# Patient Record
Sex: Female | Born: 1950 | Race: White | Hispanic: No | Marital: Married | Smoking: Never smoker
Health system: Southern US, Community
[De-identification: ages and names within clinical notes are randomized; demographics above are authoritative.]

## PROBLEM LIST (undated history)

## (undated) DIAGNOSIS — M1990 Unspecified osteoarthritis, unspecified site: Secondary | ICD-10-CM

## (undated) DIAGNOSIS — N189 Chronic kidney disease, unspecified: Secondary | ICD-10-CM

## (undated) DIAGNOSIS — I10 Essential (primary) hypertension: Secondary | ICD-10-CM

## (undated) DIAGNOSIS — E7800 Pure hypercholesterolemia, unspecified: Secondary | ICD-10-CM

## (undated) HISTORY — PX: REPLACEMENT TOTAL HIP W/  RESURFACING IMPLANTS: SUR1222

## (undated) HISTORY — PX: BACK SURGERY: SHX140

---

## 2005-07-02 ENCOUNTER — Other Ambulatory Visit: Admission: RE | Admit: 2005-07-02 | Discharge: 2005-07-02 | Payer: Self-pay | Admitting: Obstetrics and Gynecology

## 2005-07-07 ENCOUNTER — Encounter: Admission: RE | Admit: 2005-07-07 | Discharge: 2005-07-07 | Payer: Self-pay | Admitting: Allergy and Immunology

## 2007-07-05 ENCOUNTER — Encounter: Admission: RE | Admit: 2007-07-05 | Discharge: 2007-07-05 | Payer: Self-pay | Admitting: Family Medicine

## 2007-07-08 ENCOUNTER — Encounter: Admission: RE | Admit: 2007-07-08 | Discharge: 2007-07-08 | Payer: Self-pay | Admitting: Family Medicine

## 2010-11-20 ENCOUNTER — Encounter
Admission: RE | Admit: 2010-11-20 | Discharge: 2010-11-20 | Payer: Self-pay | Source: Home / Self Care | Attending: Orthopaedic Surgery | Admitting: Orthopaedic Surgery

## 2011-07-02 ENCOUNTER — Other Ambulatory Visit: Payer: Self-pay | Admitting: Gastroenterology

## 2011-07-02 DIAGNOSIS — R1011 Right upper quadrant pain: Secondary | ICD-10-CM

## 2011-07-06 ENCOUNTER — Other Ambulatory Visit: Payer: Self-pay

## 2011-07-09 ENCOUNTER — Ambulatory Visit
Admission: RE | Admit: 2011-07-09 | Discharge: 2011-07-09 | Disposition: A | Discharge status: Home / Self Care | Payer: BC Managed Care – PPO | Source: Ambulatory Visit | Attending: Gastroenterology | Admitting: Gastroenterology

## 2011-07-09 DIAGNOSIS — R1011 Right upper quadrant pain: Secondary | ICD-10-CM

## 2012-03-02 DIAGNOSIS — Z96649 Presence of unspecified artificial hip joint: Secondary | ICD-10-CM | POA: Insufficient documentation

## 2013-03-02 DIAGNOSIS — M1610 Unilateral primary osteoarthritis, unspecified hip: Secondary | ICD-10-CM | POA: Insufficient documentation

## 2014-06-29 ENCOUNTER — Ambulatory Visit
Admission: RE | Admit: 2014-06-29 | Discharge: 2014-06-29 | Disposition: A | Discharge status: Home / Self Care | Payer: BC Managed Care – PPO | Source: Ambulatory Visit | Attending: Family Medicine | Admitting: Family Medicine

## 2014-06-29 ENCOUNTER — Other Ambulatory Visit: Payer: Self-pay | Admitting: Family Medicine

## 2014-06-29 DIAGNOSIS — E871 Hypo-osmolality and hyponatremia: Secondary | ICD-10-CM

## 2015-09-17 ENCOUNTER — Other Ambulatory Visit: Payer: Self-pay | Admitting: Orthopedic Surgery

## 2015-09-17 DIAGNOSIS — M25572 Pain in left ankle and joints of left foot: Secondary | ICD-10-CM

## 2015-09-23 ENCOUNTER — Ambulatory Visit
Admission: RE | Admit: 2015-09-23 | Discharge: 2015-09-23 | Disposition: A | Discharge status: Home / Self Care | Payer: 59 | Source: Ambulatory Visit | Attending: Orthopedic Surgery | Admitting: Orthopedic Surgery

## 2015-09-23 DIAGNOSIS — M25572 Pain in left ankle and joints of left foot: Secondary | ICD-10-CM

## 2016-05-27 DIAGNOSIS — H9209 Otalgia, unspecified ear: Secondary | ICD-10-CM | POA: Insufficient documentation

## 2017-09-10 ENCOUNTER — Emergency Department (HOSPITAL_COMMUNITY): Payer: 59

## 2017-09-10 ENCOUNTER — Emergency Department (HOSPITAL_COMMUNITY)
Admission: EM | Admit: 2017-09-10 | Discharge: 2017-09-10 | Disposition: A | Discharge status: Home / Self Care | Payer: 59 | Attending: Emergency Medicine | Admitting: Emergency Medicine

## 2017-09-10 ENCOUNTER — Encounter (HOSPITAL_COMMUNITY): Payer: Self-pay | Admitting: Emergency Medicine

## 2017-09-10 DIAGNOSIS — Y999 Unspecified external cause status: Secondary | ICD-10-CM | POA: Diagnosis not present

## 2017-09-10 DIAGNOSIS — I129 Hypertensive chronic kidney disease with stage 1 through stage 4 chronic kidney disease, or unspecified chronic kidney disease: Secondary | ICD-10-CM | POA: Diagnosis not present

## 2017-09-10 DIAGNOSIS — Y92481 Parking lot as the place of occurrence of the external cause: Secondary | ICD-10-CM | POA: Insufficient documentation

## 2017-09-10 DIAGNOSIS — Y939 Activity, unspecified: Secondary | ICD-10-CM | POA: Insufficient documentation

## 2017-09-10 DIAGNOSIS — W19XXXA Unspecified fall, initial encounter: Secondary | ICD-10-CM

## 2017-09-10 DIAGNOSIS — W010XXA Fall on same level from slipping, tripping and stumbling without subsequent striking against object, initial encounter: Secondary | ICD-10-CM | POA: Diagnosis not present

## 2017-09-10 DIAGNOSIS — S50311A Abrasion of right elbow, initial encounter: Secondary | ICD-10-CM | POA: Diagnosis not present

## 2017-09-10 DIAGNOSIS — S80211A Abrasion, right knee, initial encounter: Secondary | ICD-10-CM | POA: Diagnosis not present

## 2017-09-10 DIAGNOSIS — T148XXA Other injury of unspecified body region, initial encounter: Secondary | ICD-10-CM

## 2017-09-10 DIAGNOSIS — N189 Chronic kidney disease, unspecified: Secondary | ICD-10-CM | POA: Diagnosis not present

## 2017-09-10 DIAGNOSIS — M25561 Pain in right knee: Secondary | ICD-10-CM | POA: Insufficient documentation

## 2017-09-10 HISTORY — DX: Chronic kidney disease, unspecified: N18.9

## 2017-09-10 HISTORY — DX: Essential (primary) hypertension: I10

## 2017-09-10 HISTORY — DX: Pure hypercholesterolemia, unspecified: E78.00

## 2017-09-10 MED ORDER — BACITRACIN ZINC 500 UNIT/GM EX OINT
TOPICAL_OINTMENT | Freq: Once | CUTANEOUS | Status: AC
  Administered 2017-09-10: 23:00:00 via TOPICAL

## 2017-09-10 NOTE — Discharge Instructions (Signed)
X-ray showed no fracture. Please keep the abrasions clean and dry. Apply anabolic ointment. Watch out for signs of infection.  Please rest, ice, compress and elevated the affected body part to help with swelling and pain.  The knee immobilizer for comfort. I give you follow-up with orthopedic doctor if her symptoms are not improving. Return to the ED if he develops any worsening symptoms.

## 2017-09-10 NOTE — ED Notes (Signed)
See providers notes

## 2017-09-10 NOTE — ED Provider Notes (Signed)
[facility]-EMERGENCY DEPT Provider Note   CSN: [id number] Arrival date & time: 09/10/17  1813     History   Chief Complaint Chief Complaint  Patient presents with  . Fall    HPI Suzanne Harrison is a 66 y.o. female.  HPI 66 year old female past medical history significant for CK D and hypertension presents to the emergency Department today for evaluation following a mechanical fall. Patient complains of right knee pain. Patient states that she was in the parking lot when she lost her footing and fell landing onto her right knee. She caught herself on her car with her right elbow. She has an abrasion to her right knee and right elbow. Patient states that the pain is worse in the right knee. Denies any pain in her right elbow. States that she was "fine for a while" but then states her right knee started hurting when she was visiting another patient. Patient did not take any for pain prior to arrival. States that she takes oxycodone at home for chronic pain but did not take her medicine prior to coming to the ED. Patient denies any LOC or head injury. Has been ambulatory since the accident. Uses a cane at baseline. Denies any associated paresthesias, weakness. Past Medical History:  Diagnosis Date  . CKD (chronic kidney disease)   . Hypercholesteremia   . Hypertension     There are no active problems to display for this patient.   Past Surgical History:  Procedure Laterality Date  . BACK SURGERY    . REPLACEMENT TOTAL HIP W/  RESURFACING IMPLANTS      OB History    No data available       Home Medications    Prior to Admission medications   Not on File    Family History History reviewed. No pertinent family history.  Social History Social History  Substance Use Topics  . Smoking status: Never Smoker  . Smokeless tobacco: Never Used  . Alcohol use No     Allergies   Sulfa antibiotics   Review of Systems Review of Systems  Musculoskeletal: Positive for  arthralgias and joint swelling. Negative for back pain, gait problem and neck pain.  Skin: Positive for wound.  Neurological: Negative for syncope, weakness, numbness and headaches.     Physical Exam Updated Vital Signs BP 110/61 (BP Location: Right Arm)   Pulse (!) 58   Temp 98 F (36.7 C) (Oral)   Resp 20   SpO2 100%   Physical Exam  Constitutional: She is oriented to person, place, and time. She appears well-developed and well-nourished. No distress.  HENT:  Head: Normocephalic and atraumatic.  Eyes: Right eye exhibits no discharge. Left eye exhibits no discharge. No scleral icterus.  Neck: Normal range of motion.  Pulmonary/Chest: No respiratory distress.  Musculoskeletal: Normal range of motion.       Right elbow: She exhibits normal range of motion, no swelling, no effusion, no deformity and no laceration. No tenderness found. No radial head, no medial epicondyle, no lateral epicondyle and no olecranon process tenderness noted.       Right knee: She exhibits normal range of motion, no swelling, no effusion, no ecchymosis, no deformity, no laceration, no erythema, normal alignment, no LCL laxity, no bony tenderness, normal meniscus and no MCL laxity. Tenderness found. Patellar tendon tenderness noted. No medial joint line, no lateral joint line, no MCL and no LCL tenderness noted.  Abrasion to her right knee and right elbow. No bleeding noted.  No laceration noted. Ambulatory with a cane.  DP pulses 2+ bilaterally. Sensation intact. Cap refill normal. Radial pulses 2+ bilaterally.  Full range motion of all joints by erythema, warmth.  Neurological: She is alert and oriented to person, place, and time.  Skin: Skin is warm and dry. Capillary refill takes less than 2 seconds. No pallor.  Psychiatric: Her behavior is normal. Judgment and thought content normal.  Nursing note and vitals reviewed.    ED Treatments / Results  Labs (all labs ordered are listed, but only abnormal  results are displayed) Labs Reviewed - No data to display  EKG  EKG Interpretation None       Radiology Dg Knee Complete 4 Views Right  Result Date: 09/10/2017 CLINICAL DATA:  Right knee pain since a fall today. Initial encounter. EXAM: RIGHT KNEE - COMPLETE 4+ VIEW COMPARISON:  None. FINDINGS: There is no acute bony or joint abnormality. Joint spaces are preserved but there is osteophytosis about the knee which is worst laterally. No joint effusion. IMPRESSION: No acute abnormality. Osteoarthritis. Electronically Signed   By: Drusilla Kanner M.D.   On: 09/10/2017 19:40    Procedures Procedures (including critical care time)  Medications Ordered in ED Medications  bacitracin ointment (not administered)     Initial Impression / Assessment and Plan / ED Course  I have reviewed the triage vital signs and the nursing notes.  Pertinent labs & imaging results that were available during my care of the patient were reviewed by me and considered in my medical decision making (see chart for details).     Patient presents to the ED with right knee pain following a mechanical fall prior to arrival. Denies head injury or LOC. Patient X-Ray negative for obvious fracture or dislocation. Pain managed in ED. Pt advised to follow up with orthopedics if symptoms persist for possibility of missed fracture diagnosis. Patient given brace while in ED, conservative therapy recommended and discussed. Patient was requesting her home dose of Percocet to go home with for pain. Informed patient that she was driving and cannot give her her Percocet in the ED. Informed her to take her Percocet when she goes home. Abrasions were dressed and about was applied. Full range of motion of the right elbow did not feel imaging is indicated at this time as patient denies any pain.   Pt is hemodynamically stable, in NAD, & able to ambulate in the ED. Evaluation does not show pathology that would require ongoing emergent  intervention or inpatient treatment. I explained the diagnosis to the patient. Pain has been managed & has no complaints prior to dc. Pt is comfortable with above plan and is stable for discharge at this time. All questions were answered prior to disposition. Strict return precautions for f/u to the ED were discussed. Encouraged follow up with PCP.    Final Clinical Impressions(s) / ED Diagnoses   Final diagnoses:  Fall, initial encounter  Acute pain of right knee  Abrasion    New Prescriptions New Prescriptions   No medications on file     Wallace Keller 09/10/17 2224    Vanetta Mulders, MD 09/11/17 [phone]

## 2017-09-10 NOTE — Progress Notes (Signed)
Orthopedic Tech Progress Note Patient Details:  Suzanne Harrison [birth year] [id number]  Ortho Devices Type of Ortho Device: Knee Immobilizer Ortho Device/Splint Location: Right Ortho Device/Splint Interventions: Application, Adjustment   Alvina Chou 09/10/2017, 10:54 PM

## 2017-09-10 NOTE — ED Triage Notes (Signed)
Pt presents to ED after having a slip and fall in the parking lot where patient went down on to her right knee and caught herself on her car with her elbow.  Pt states she was "fine for a while" but then states her right knee started to hurt while she was visiting with another patient.

## 2018-03-03 ENCOUNTER — Encounter: Payer: Self-pay | Admitting: Podiatry

## 2018-03-03 ENCOUNTER — Ambulatory Visit (INDEPENDENT_AMBULATORY_CARE_PROVIDER_SITE_OTHER): Payer: Medicare Other | Admitting: Podiatry

## 2018-03-03 DIAGNOSIS — L600 Ingrowing nail: Secondary | ICD-10-CM

## 2018-03-03 MED ORDER — NEOMYCIN-POLYMYXIN-HC 1 % OT SOLN: OTIC | 1 refills | Status: DC

## 2018-03-03 NOTE — Patient Instructions (Signed)

## 2018-03-03 NOTE — Progress Notes (Signed)
Subjective:  Patient ID: Suzanne Harrison, female    DOB: 1951/10/31,  MRN: [id number] HPI Chief Complaint  Patient presents with  . Toe Pain    Hallux bilateral - medial borders, red, swollen, some drainage from left x couple weeks, cleaning with peroxide, patient states she took 4 amoxicillin prior to todays visit for prophylaxis  . New Patient (Initial Visit)    67 y.o. female presents with the above complaint.    ROS: Denies fever chills nausea vomiting muscle aches pain chest pain shortness of breath calf pain headache.  Past Medical History:  Diagnosis Date  . CKD (chronic kidney disease)   . Hypercholesteremia   . Hypertension    Past Surgical History:  Procedure Laterality Date  . BACK SURGERY    . REPLACEMENT TOTAL HIP W/  RESURFACING IMPLANTS      Current Outpatient Medications:  .  amoxicillin (AMOXIL) 500 MG tablet, Take four capsules one hour prior to procedure., Disp: , Rfl:  .  Carboxymethylcellulose Sodium (REFRESH TEARS OP), Apply to eye., Disp: , Rfl:  .  cycloSPORINE (RESTASIS OP), Apply to eye., Disp: , Rfl:  .  traZODone (DESYREL) 50 MG tablet, trazodone 50 mg tablet, Disp: , Rfl:  .  citalopram (CELEXA) 20 MG tablet, Take 20 mg by mouth daily., Disp: , Rfl: 2 .  levothyroxine (SYNTHROID, LEVOTHROID) 50 MCG tablet, Take 50 mcg by mouth daily., Disp: , Rfl: 2 .  lisinopril (PRINIVIL,ZESTRIL) 5 MG tablet, Take 5 mg by mouth daily., Disp: , Rfl: 5 .  NEOMYCIN-POLYMYXIN-HYDROCORTISONE (CORTISPORIN) 1 % SOLN OTIC solution, Apply 1-2 drops to toe BID after soaking, Disp: 10 mL, Rfl: 1 .  niacin (NIASPAN) 500 MG CR tablet, TAKE 1 TABLET BY MOUTH EVERYDAY AT BEDTIME, Disp: , Rfl: 2 .  ondansetron (ZOFRAN-ODT) 8 MG disintegrating tablet, TAKE AS DIRECTED EVERY 8 HOURS AS NEEDED FOR NAUSEA, Disp: , Rfl: 0 .  oxybutynin (DITROPAN-XL) 10 MG 24 hr tablet, Take 10 mg by mouth daily., Disp: , Rfl: 11 .  oxyCODONE-acetaminophen (PERCOCET) 10-325 MG tablet, TAKE 1  TABLET BY MOUTH EVERY 6 HOURS AS NEEDED FOR PAIN AS NEEDED, Disp: , Rfl: 0 .  pantoprazole (PROTONIX) 40 MG tablet, Take 40 mg by mouth 2 (two) times daily., Disp: , Rfl: 2 .  pregabalin (LYRICA) 150 MG capsule, Lyrica 150 mg capsule, Disp: , Rfl:  .  rosuvastatin (CRESTOR) 10 MG tablet, Take 10 mg by mouth daily., Disp: , Rfl: 5  Allergies  Allergen Reactions  . Sulfa Antibiotics Nausea Only  . Gatifloxacin Rash    Other reaction(s): Myalgias (Muscle Pain) Rash in mouth    Review of Systems Objective:  There were no vitals filed for this visit.  General: Well developed, nourished, in no acute distress, alert and oriented x3   Dermatological: Skin is warm, dry and supple bilateral. Nails x 10 are well maintained; remaining integument appears unremarkable at this time. There are no open sores, no preulcerative lesions, no rash or signs of infection present.  Ingrown toenail tibial border hallux left mild erythema no cellulitis drainage or odor.  Vascular: Dorsalis Pedis artery and Posterior Tibial artery pedal pulses are 2/4 bilateral with immedate capillary fill time. Pedal hair growth present. No varicosities and no lower extremity edema present bilateral.   Neruologic: Grossly intact via light touch bilateral. Vibratory intact via tuning fork bilateral. Protective threshold with Semmes Wienstein monofilament intact to all pedal sites bilateral. Patellar and Achilles deep tendon reflexes 2+ bilateral. No Babinski  or clonus noted bilateral.   Musculoskeletal: No gross boney pedal deformities bilateral. No pain, crepitus, or limitation noted with foot and ankle range of motion bilateral. Muscular strength 5/5 in all groups tested bilateral.  Gait: Unassisted, Nonantalgic.    Radiographs:  None taken  Assessment & Plan:   Assessment: Ingrown nail tibial border hallux left.  Plan: We discussed etiology pathology conservative versus surgical therapies.  At this point we decided to  perform a chemical matrixectomy which was performed after 3 cc of a 50-50 mixture of Marcaine plain and lidocaine plain was infiltrated in a hallux block left.  The left lower extremity then prepped and draped as normal sterile fashion.  The tibial border was split from distal to proximal avulsed exposing the matrix.  3 applications of phenol 30 seconds each were applied to the matrix and then neutralized with isopropyl alcohol.  Silvadene cream Telfa pad dresser compressive dressing was applied.  She is provided with both oral and written home-going instruction for care and soaking of her toe as well as a prescription for Cortisporin Otic to be applied twice daily after soaking.  We will follow-up with her in 1 week should she have questions or concerns she will notify us immediately.     Jillane Po T. LoomisHyatt, North DakotaDPM

## 2018-03-17 ENCOUNTER — Encounter: Payer: Self-pay | Admitting: Podiatry

## 2018-03-17 ENCOUNTER — Ambulatory Visit (INDEPENDENT_AMBULATORY_CARE_PROVIDER_SITE_OTHER): Payer: Medicare Other | Admitting: Podiatry

## 2018-03-17 DIAGNOSIS — L600 Ingrowing nail: Secondary | ICD-10-CM

## 2018-03-17 NOTE — Patient Instructions (Signed)

## 2018-03-17 NOTE — Progress Notes (Signed)
She presents today for follow-up of matrixectomy hallux left.  Denies fever chills nausea vomiting muscle aches and pains states that is doing just perfectly well.  No pain.  Objective: Vital signs are stable alert and oriented x3.  Pulses are palpable.  There is no erythema edema cellulitis drainage or odor.  Assessment: Well-healing surgical matrixectomy hallux left.  Plan: Encouraged her to soak in Epsom salts every other day apply Cortisporin otic cover during the daytime and leave open at bedtime for at least another week or so and I will follow-up with her in 2 weeks if this is not completely resolved.

## 2019-10-01 ENCOUNTER — Encounter (HOSPITAL_COMMUNITY): Payer: Self-pay | Admitting: Emergency Medicine

## 2019-10-01 ENCOUNTER — Emergency Department (HOSPITAL_COMMUNITY): Payer: 59

## 2019-10-01 ENCOUNTER — Inpatient Hospital Stay (HOSPITAL_COMMUNITY)
Admission: EM | Admit: 2019-10-01 | Discharge: 2019-10-06 | DRG: 410 | Disposition: A | Discharge status: Home / Self Care | Payer: 59 | Attending: General Surgery | Admitting: General Surgery

## 2019-10-01 ENCOUNTER — Other Ambulatory Visit: Payer: Self-pay

## 2019-10-01 DIAGNOSIS — G8929 Other chronic pain: Secondary | ICD-10-CM | POA: Diagnosis present

## 2019-10-01 DIAGNOSIS — N189 Chronic kidney disease, unspecified: Secondary | ICD-10-CM | POA: Diagnosis present

## 2019-10-01 DIAGNOSIS — Z881 Allergy status to other antibiotic agents status: Secondary | ICD-10-CM

## 2019-10-01 DIAGNOSIS — R7989 Other specified abnormal findings of blood chemistry: Secondary | ICD-10-CM

## 2019-10-01 DIAGNOSIS — K8020 Calculus of gallbladder without cholecystitis without obstruction: Secondary | ICD-10-CM

## 2019-10-01 DIAGNOSIS — E876 Hypokalemia: Secondary | ICD-10-CM | POA: Diagnosis present

## 2019-10-01 DIAGNOSIS — K810 Acute cholecystitis: Secondary | ICD-10-CM

## 2019-10-01 DIAGNOSIS — E785 Hyperlipidemia, unspecified: Secondary | ICD-10-CM | POA: Diagnosis present

## 2019-10-01 DIAGNOSIS — I129 Hypertensive chronic kidney disease with stage 1 through stage 4 chronic kidney disease, or unspecified chronic kidney disease: Secondary | ICD-10-CM | POA: Diagnosis present

## 2019-10-01 DIAGNOSIS — Z79890 Hormone replacement therapy (postmenopausal): Secondary | ICD-10-CM | POA: Diagnosis not present

## 2019-10-01 DIAGNOSIS — Z20828 Contact with and (suspected) exposure to other viral communicable diseases: Secondary | ICD-10-CM | POA: Diagnosis present

## 2019-10-01 DIAGNOSIS — K8062 Calculus of gallbladder and bile duct with acute cholecystitis without obstruction: Principal | ICD-10-CM | POA: Diagnosis present

## 2019-10-01 DIAGNOSIS — Z882 Allergy status to sulfonamides status: Secondary | ICD-10-CM

## 2019-10-01 DIAGNOSIS — Z79899 Other long term (current) drug therapy: Secondary | ICD-10-CM

## 2019-10-01 DIAGNOSIS — R1010 Upper abdominal pain, unspecified: Secondary | ICD-10-CM

## 2019-10-01 DIAGNOSIS — R1013 Epigastric pain: Secondary | ICD-10-CM | POA: Diagnosis present

## 2019-10-01 DIAGNOSIS — R945 Abnormal results of liver function studies: Secondary | ICD-10-CM

## 2019-10-01 HISTORY — DX: Calculus of gallbladder without cholecystitis without obstruction: K80.20

## 2019-10-01 LAB — CBC WITH DIFFERENTIAL/PLATELET
Abs Immature Granulocytes: 0.05 10*3/uL (ref 0.00–0.07)
Basophils Absolute: 0 10*3/uL (ref 0.0–0.1)
Basophils Relative: 0 %
Eosinophils Absolute: 0 10*3/uL (ref 0.0–0.5)
Eosinophils Relative: 0 %
HCT: 36.8 % (ref 36.0–46.0)
Hemoglobin: 11.6 g/dL — ABNORMAL LOW (ref 12.0–15.0)
Immature Granulocytes: 1 %
Lymphocytes Relative: 5 %
Lymphs Abs: 0.3 10*3/uL — ABNORMAL LOW (ref 0.7–4.0)
MCH: 26.5 pg (ref 26.0–34.0)
MCHC: 31.5 g/dL (ref 30.0–36.0)
MCV: 84 fL (ref 80.0–100.0)
Monocytes Absolute: 0.4 10*3/uL (ref 0.1–1.0)
Monocytes Relative: 6 %
Neutro Abs: 6.7 10*3/uL (ref 1.7–7.7)
Neutrophils Relative %: 88 %
Platelets: 128 10*3/uL — ABNORMAL LOW (ref 150–400)
RBC: 4.38 MIL/uL (ref 3.87–5.11)
RDW: 14.5 % (ref 11.5–15.5)
WBC: 7.5 10*3/uL (ref 4.0–10.5)
nRBC: 0 % (ref 0.0–0.2)

## 2019-10-01 LAB — COMPREHENSIVE METABOLIC PANEL
ALT: 114 U/L — ABNORMAL HIGH (ref 0–44)
AST: 297 U/L — ABNORMAL HIGH (ref 15–41)
Albumin: 3.2 g/dL — ABNORMAL LOW (ref 3.5–5.0)
Alkaline Phosphatase: 309 U/L — ABNORMAL HIGH (ref 38–126)
Anion gap: 10 (ref 5–15)
BUN: 11 mg/dL (ref 8–23)
CO2: 26 mmol/L (ref 22–32)
Calcium: 9.1 mg/dL (ref 8.9–10.3)
Chloride: 101 mmol/L (ref 98–111)
Creatinine, Ser: 1 mg/dL (ref 0.44–1.00)
GFR calc Af Amer: >60 mL/min (ref >60)
GFR calc non Af Amer: 59 mL/min — ABNORMAL LOW (ref >60)
Glucose, Bld: 133 mg/dL — ABNORMAL HIGH (ref 70–99)
Potassium: 3.5 mmol/L (ref 3.5–5.1)
Sodium: 137 mmol/L (ref 135–145)
Total Bilirubin: 2.9 mg/dL — ABNORMAL HIGH (ref 0.3–1.2)
Total Protein: 6.5 g/dL (ref 6.5–8.1)

## 2019-10-01 LAB — URINALYSIS, ROUTINE W REFLEX MICROSCOPIC
Bilirubin Urine: NEGATIVE
Glucose, UA: NEGATIVE mg/dL
Hgb urine dipstick: NEGATIVE
Ketones, ur: NEGATIVE mg/dL
Leukocytes,Ua: NEGATIVE
Nitrite: NEGATIVE
Protein, ur: NEGATIVE mg/dL
Specific Gravity, Urine: 1.038 — ABNORMAL HIGH (ref 1.005–1.030)
pH: 5 (ref 5.0–8.0)

## 2019-10-01 LAB — LACTIC ACID, PLASMA
Lactic Acid, Venous: 1.1 mmol/L (ref 0.5–1.9)
Lactic Acid, Venous: 0.7 mmol/L (ref 0.5–1.9)

## 2019-10-01 LAB — LIPASE, BLOOD: Lipase: 25 U/L (ref 11–51)

## 2019-10-01 MED ORDER — ACETAMINOPHEN 650 MG RE SUPP: 650.0000 mg | Freq: Four times a day (QID) | RECTAL | Status: DC | PRN

## 2019-10-01 MED ORDER — ONDANSETRON HCL 4 MG/2ML IJ SOLN
4.0000 mg | Freq: Four times a day (QID) | INTRAMUSCULAR | Status: DC | PRN
  Administered 2019-10-04 – 2019-10-05 (×3): 4 mg via INTRAVENOUS
  Filled 2019-10-01 (×3): qty 2

## 2019-10-01 MED ORDER — METOPROLOL TARTRATE 5 MG/5ML IV SOLN: 5.0000 mg | Freq: Four times a day (QID) | INTRAVENOUS | Status: DC | PRN

## 2019-10-01 MED ORDER — DEXTROSE-NACL 5-0.45 % IV SOLN
INTRAVENOUS | Status: DC
  Administered 2019-10-01 – 2019-10-04 (×5): via INTRAVENOUS

## 2019-10-01 MED ORDER — CITALOPRAM HYDROBROMIDE 20 MG PO TABS
20.0000 mg | ORAL_TABLET | Freq: Every day | ORAL | Status: DC
  Administered 2019-10-01 – 2019-10-06 (×5): 20 mg via ORAL
  Filled 2019-10-01: qty 2
  Filled 2019-10-01 (×5): qty 1

## 2019-10-01 MED ORDER — SODIUM CHLORIDE 0.9% FLUSH: 3.0000 mL | Freq: Once | INTRAVENOUS | Status: DC

## 2019-10-01 MED ORDER — SODIUM CHLORIDE 0.9% FLUSH
3.0000 mL | Freq: Once | INTRAVENOUS | Status: AC
  Administered 2019-10-01: 3 mL via INTRAVENOUS

## 2019-10-01 MED ORDER — PREGABALIN 100 MG PO CAPS
100.0000 mg | ORAL_CAPSULE | Freq: Every day | ORAL | Status: DC
  Administered 2019-10-01 – 2019-10-04 (×4): 100 mg via ORAL
  Filled 2019-10-01 (×5): qty 1

## 2019-10-01 MED ORDER — TRAZODONE HCL 50 MG PO TABS
50.0000 mg | ORAL_TABLET | Freq: Every day | ORAL | Status: DC
  Administered 2019-10-01 – 2019-10-05 (×5): 50 mg via ORAL
  Filled 2019-10-01 (×5): qty 1

## 2019-10-01 MED ORDER — DIPHENHYDRAMINE HCL 50 MG/ML IJ SOLN: 12.5000 mg | Freq: Four times a day (QID) | INTRAMUSCULAR | Status: DC | PRN

## 2019-10-01 MED ORDER — OXYCODONE HCL 5 MG PO TABS
5.0000 mg | ORAL_TABLET | ORAL | Status: DC | PRN
  Administered 2019-10-02 – 2019-10-03 (×4): 5 mg via ORAL
  Filled 2019-10-01 (×4): qty 1

## 2019-10-01 MED ORDER — DIPHENHYDRAMINE HCL 12.5 MG/5ML PO ELIX: 12.5000 mg | ORAL_SOLUTION | Freq: Four times a day (QID) | ORAL | Status: DC | PRN

## 2019-10-01 MED ORDER — ENOXAPARIN SODIUM 40 MG/0.4ML ~~LOC~~ SOLN: 40.0000 mg | SUBCUTANEOUS | Status: DC

## 2019-10-01 MED ORDER — OXYBUTYNIN CHLORIDE ER 10 MG PO TB24
10.0000 mg | ORAL_TABLET | Freq: Every day | ORAL | Status: DC
  Administered 2019-10-02 – 2019-10-05 (×5): 10 mg via ORAL
  Filled 2019-10-01 (×5): qty 1

## 2019-10-01 MED ORDER — PANTOPRAZOLE SODIUM 40 MG IV SOLR
40.0000 mg | Freq: Once | INTRAVENOUS | Status: AC
  Administered 2019-10-01: 40 mg via INTRAVENOUS
  Filled 2019-10-01: qty 40

## 2019-10-01 MED ORDER — ONDANSETRON HCL 4 MG/2ML IJ SOLN
4.0000 mg | Freq: Once | INTRAMUSCULAR | Status: AC
  Administered 2019-10-01: 4 mg via INTRAVENOUS
  Filled 2019-10-01: qty 2

## 2019-10-01 MED ORDER — ONDANSETRON 4 MG PO TBDP
4.0000 mg | ORAL_TABLET | Freq: Four times a day (QID) | ORAL | Status: DC | PRN
  Administered 2019-10-06: 4 mg via ORAL
  Filled 2019-10-01: qty 1

## 2019-10-01 MED ORDER — ACETAMINOPHEN 325 MG PO TABS
650.0000 mg | ORAL_TABLET | Freq: Four times a day (QID) | ORAL | Status: DC | PRN
  Administered 2019-10-03: 650 mg via ORAL
  Filled 2019-10-01: qty 2

## 2019-10-01 MED ORDER — SODIUM CHLORIDE 0.9 % IV BOLUS
1000.0000 mL | Freq: Once | INTRAVENOUS | Status: AC
  Administered 2019-10-01: 1000 mL via INTRAVENOUS

## 2019-10-01 MED ORDER — LEVOTHYROXINE SODIUM 50 MCG PO TABS
50.0000 ug | ORAL_TABLET | Freq: Every day | ORAL | Status: DC
  Administered 2019-10-02 – 2019-10-06 (×4): 50 ug via ORAL
  Filled 2019-10-01 (×4): qty 1

## 2019-10-01 MED ORDER — IOHEXOL 300 MG/ML  SOLN
100.0000 mL | Freq: Once | INTRAMUSCULAR | Status: AC | PRN
  Administered 2019-10-01: 100 mL via INTRAVENOUS

## 2019-10-01 MED ORDER — MORPHINE SULFATE (PF) 2 MG/ML IV SOLN
2.0000 mg | INTRAVENOUS | Status: DC | PRN
  Administered 2019-10-03 – 2019-10-04 (×3): 2 mg via INTRAVENOUS
  Filled 2019-10-01 (×4): qty 1

## 2019-10-01 MED ORDER — SODIUM CHLORIDE 0.9 % IV SOLN
2.0000 g | INTRAVENOUS | Status: DC
  Administered 2019-10-01 – 2019-10-05 (×5): 2 g via INTRAVENOUS
  Filled 2019-10-01 (×2): qty 2
  Filled 2019-10-01: qty 20
  Filled 2019-10-01 (×2): qty 2
  Filled 2019-10-01: qty 20

## 2019-10-01 MED ORDER — HYDROMORPHONE HCL 1 MG/ML IJ SOLN
0.5000 mg | Freq: Once | INTRAMUSCULAR | Status: AC
  Administered 2019-10-01: 0.5 mg via INTRAVENOUS
  Filled 2019-10-01: qty 1

## 2019-10-01 NOTE — ED Triage Notes (Signed)
C/o generalized abd pain, nausea, vomiting, diarrhea, fever, SOB, generalized weakness, and fatigue since yesterday.  No known sick contacts.

## 2019-10-01 NOTE — ED Provider Notes (Signed)
[facility]   CSN: [id number] Arrival date & time: 10/01/19  1810     History   Chief Complaint Chief Complaint  Patient presents with   Abdominal Pain   Emesis   Diarrhea   Shortness of Breath   Fever    HPI Suzanne Harrison is a 68 y.o. female.     Patient complains of abdominal pain and nausea since yesterday.  Patient has a history of hyperlipidemia and elevated cholesterol.  The history is provided by the patient. No language interpreter was used.  Abdominal Pain Pain location:  Epigastric Pain quality: aching   Pain radiates to:  Does not radiate Pain severity:  Moderate Onset quality:  Sudden Timing:  Constant Progression:  Worsening Chronicity:  New Context: not alcohol use   Associated symptoms: diarrhea, fever, shortness of breath and vomiting   Associated symptoms: no chest pain, no cough, no fatigue and no hematuria   Emesis Associated symptoms: abdominal pain, diarrhea and fever   Associated symptoms: no cough and no headaches   Diarrhea Associated symptoms: abdominal pain, fever and vomiting   Associated symptoms: no headaches   Shortness of Breath Associated symptoms: abdominal pain, fever and vomiting   Associated symptoms: no chest pain, no cough, no headaches and no rash   Fever Associated symptoms: diarrhea and vomiting   Associated symptoms: no chest pain, no congestion, no cough, no headaches and no rash     Past Medical History:  Diagnosis Date   CKD (chronic kidney disease)    Hypercholesteremia    Hypertension     Patient Active Problem List   Diagnosis Date Noted   Referred ear pain 05/27/2016   Hip arthritis 03/02/2013   History of total hip arthroplasty 03/02/2012    Past Surgical History:  Procedure Laterality Date   BACK SURGERY     REPLACEMENT TOTAL HIP W/  RESURFACING IMPLANTS       OB History   No obstetric history on file.      Home  Medications    Prior to Admission medications   Medication Sig Start Date End Date Taking? Authorizing Provider  amoxicillin (AMOXIL) 500 MG tablet Take four capsules one hour prior to procedure. 02/25/18   [provider]  Carboxymethylcellulose Sodium (REFRESH TEARS OP) Apply to eye.    [provider]  citalopram (CELEXA) 20 MG tablet Take 20 mg by mouth daily. 02/23/18   [provider]  cycloSPORINE (RESTASIS OP) Apply to eye.    [provider]  levothyroxine (SYNTHROID, LEVOTHROID) 50 MCG tablet Take 50 mcg by mouth daily. 02/15/18   [provider]  lisinopril (PRINIVIL,ZESTRIL) 5 MG tablet Take 5 mg by mouth daily. 02/09/18   [provider]  NEOMYCIN-POLYMYXIN-HYDROCORTISONE (CORTISPORIN) 1 % SOLN OTIC solution Apply 1-2 drops to toe BID after soaking 03/03/18   Hyatt, Max T, DPM  niacin (NIASPAN) 500 MG CR tablet TAKE 1 TABLET BY MOUTH EVERYDAY AT BEDTIME 02/24/18   [provider]  ondansetron (ZOFRAN-ODT) 8 MG disintegrating tablet TAKE AS DIRECTED EVERY 8 HOURS AS NEEDED FOR NAUSEA 01/14/18   [provider]  oxybutynin (DITROPAN-XL) 10 MG 24 hr tablet Take 10 mg by mouth daily. 02/13/18   [provider]  oxyCODONE-acetaminophen (PERCOCET) 10-325 MG tablet TAKE 1 TABLET BY MOUTH EVERY 6 HOURS AS NEEDED FOR PAIN AS NEEDED 02/16/18   [provider]  pantoprazole (PROTONIX) 40 MG tablet Take 40 mg by mouth 2 (  two) times daily. 02/11/18   [provider]  pregabalin (LYRICA) 150 MG capsule Lyrica 150 mg capsule    [provider]  rosuvastatin (CRESTOR) 10 MG tablet Take 10 mg by mouth daily. 02/15/18   [provider]  traZODone (DESYREL) 50 MG tablet trazodone 50 mg tablet 12/16/12   [provider]    Family History No family history on file.  Social History Social History   Tobacco Use   Smoking status: Never Smoker   Smokeless tobacco: Never Used  Substance  Use Topics   Alcohol use: No   Drug use: No     Allergies   Sulfa antibiotics and Gatifloxacin   Review of Systems Review of Systems  Constitutional: Positive for fever. Negative for appetite change and fatigue.  HENT: Negative for congestion, ear discharge and sinus pressure.   Eyes: Negative for discharge.  Respiratory: Positive for shortness of breath. Negative for cough.   Cardiovascular: Negative for chest pain.  Gastrointestinal: Positive for abdominal pain, diarrhea and vomiting.  Genitourinary: Negative for frequency and hematuria.  Musculoskeletal: Negative for back pain.  Skin: Negative for rash.  Neurological: Negative for seizures and headaches.  Psychiatric/Behavioral: Negative for hallucinations.     Physical Exam Updated Vital Signs BP (!) 132/53    Pulse 89    Temp 99.8 F (37.7 C) (Oral)    Resp 15    SpO2 96%   Physical Exam Vitals signs and nursing note reviewed.  Constitutional:      Appearance: Normal appearance. She is well-developed.  HENT:     Head: Normocephalic.     Nose: Nose normal.  Eyes:     General: No scleral icterus.    Conjunctiva/sclera: Conjunctivae normal.  Neck:     Musculoskeletal: Neck supple.     Thyroid: No thyromegaly.  Cardiovascular:     Rate and Rhythm: Normal rate and regular rhythm.     Heart sounds: No murmur. No friction rub. No gallop.   Pulmonary:     Breath sounds: No stridor. No wheezing or rales.  Chest:     Chest wall: No tenderness.  Abdominal:     General: There is no distension.     Tenderness: There is abdominal tenderness. There is no rebound.  Musculoskeletal: Normal range of motion.  Lymphadenopathy:     Cervical: No cervical adenopathy.  Skin:    Findings: No erythema or rash.  Neurological:     Mental Status: She is alert and oriented to person, place, and time.     Motor: No abnormal muscle tone.     Coordination: Coordination normal.  Psychiatric:        Behavior: Behavior normal.       ED Treatments / Results  Labs (all labs ordered are listed, but only abnormal results are displayed) Labs Reviewed  COMPREHENSIVE METABOLIC PANEL - Abnormal; Notable for the following components:      Result Value   Glucose, Bld 133 (*)    Albumin 3.2 (*)    AST 297 (*)    ALT 114 (*)    Alkaline Phosphatase 309 (*)    Total Bilirubin 2.9 (*)    GFR calc non Af Amer 59 (*)    All other components within normal limits  CBC WITH DIFFERENTIAL/PLATELET - Abnormal; Notable for the following components:   Hemoglobin 11.6 (*)    Platelets 128 (*)    Lymphs Abs 0.3 (*)    All other components within normal limits  SARS CORONAVIRUS 2 (TAT 6-24 HRS)  LACTIC ACID, PLASMA  LIPASE, BLOOD  LACTIC ACID, PLASMA  URINALYSIS, ROUTINE W REFLEX MICROSCOPIC  HEPATITIS PANEL, ACUTE    EKG None  Radiology Ct Abdomen Pelvis W Contrast  Result Date: 10/01/2019 CLINICAL DATA:  Generalized abdominal pain, nausea, vomiting, diarrhea, fever, SOB, and generalized weakness, and fatigue since yesterday. Hx CKD, back surgery, and total hip replacement. EXAM: CT ABDOMEN AND PELVIS WITH CONTRAST TECHNIQUE: Multidetector CT imaging of the abdomen and pelvis was performed using the standard protocol following bolus administration of intravenous contrast. CONTRAST:  OMNIPAQUE IOHEXOL 300 MG/ML  SOLN COMPARISON:  None. FINDINGS: Lower chest: Clear lung bases.  Heart normal in size. Hepatobiliary: Normal liver. Abnormal gallbladder. Wall is thickened between 5-6 mm and over a cm. There is mild adjacent hazy inflammation. No visualized stone. No bile duct dilation. Pancreas: Unremarkable. No pancreatic ductal dilatation or surrounding inflammatory changes. Spleen: Normal in size without focal abnormality. Adrenals/Urinary Tract: Left greater than right renal cortical thinning. Symmetric renal enhancement and excretion. Multiple bilateral low-attenuation renal masses. Largest on the left, lower pole, 8.5 cm.  Largest on the right lateral upper pole, 5 cm. 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 Other renal masses either clearly cysts or likely cyst, too small to characterize. No intrarenal stones. No hydronephrosis. Ureters normal in course and in caliber. Bladder is unremarkable. Stomach/Bowel: Stomach is unremarkable. Small bowel and colon are normal in caliber. No wall thickening. No inflammation. Mild generalized increased stool burden throughout the colon. No evidence of appendicitis. Vascular/Lymphatic: No significant vascular findings are present. No enlarged abdominal or pelvic lymph nodes. Reproductive: Uterus and bilateral adnexa are unremarkable. Other: No abdominal wall hernia or abnormality. No abdominopelvic ascites. Musculoskeletal: S/p long thoracolumbar fusion and bilateral total hip arthroplasties. Orthopedic hardware appears well seated. Sclerosis and cystic changes noted involving both SI joints. No acute fracture. IMPRESSION: 1. Abnormal gallbladder with significant wall thickening and mild adjacent inflammation. Suspect acute cholecystitis. No stone visualized by CT. Consider follow-up right upper quadrant ultrasound to assess for non radiopaque stones. 2. No other acute abnormality within the abdomen or pelvis. Electronically Signed   By: Amie Portland M.D.   On: 10/01/2019 20:31   Dg Chest Portable 1 View  Result Date: 10/01/2019 CLINICAL DATA:  Shortness of breath, history hypertension EXAM: PORTABLE CHEST 1 VIEW COMPARISON:  Portable exam 1936 hours compared to 06/29/2014 FINDINGS: Normal heart size, mediastinal contours, and pulmonary vascularity. Minimal RIGHT basilar atelectasis. Remaining lungs clear. No pleural effusion or pneumothorax. Osseous demineralization with prior thoracic spine fixation. BILATERAL glenohumeral degenerative changes. IMPRESSION: Minimal RIGHT basilar atelectasis. Electronically Signed   By: Ulyses Southward M.D.   On: 10/01/2019 19:53    Procedures Procedures (including critical care time)  Medications Ordered in ED Medications  sodium chloride flush (NS) 0.9 % injection 3 mL (3 mLs Intravenous Not Given 10/01/19 1953)  sodium chloride flush (NS) 0.9 % injection 3 mL (3 mLs Intravenous Given 10/01/19 1949)  sodium chloride 0.9 % bolus 1,000 mL (1,000 mLs Intravenous New Bag/Given 10/01/19 1952)  ondansetron (ZOFRAN) injection 4 mg (4 mg Intravenous Given 10/01/19 1948)  HYDROmorphone (DILAUDID) injection 0.5 mg (0.5 mg Intravenous Given 10/01/19 1948)  pantoprazole (PROTONIX) injection 40 mg (40 mg Intravenous Given 10/01/19 1948)  iohexol (OMNIPAQUE) 300 MG/ML solution 100 mL (100 mLs Intravenous Contrast Given 10/01/19 2005)     Initial Impression / Assessment and Plan /  ED Course  I have reviewed the triage vital signs and the nursing notes.  Pertinent labs & imaging results that were available during my care of the patient were reviewed by me and considered in my medical decision making (see chart for details).        Patient with abdominal pain and CT scan suggesting cholecystitis General surgery to see the patient. Final Clinical Impressions(s) / ED Diagnoses   Final diagnoses:  None    ED Discharge Orders    None       Bethann Berkshire, MD 10/01/19 2109

## 2019-10-01 NOTE — H&P (Signed)
Reason for Consult: abdominal pain Referring Physician: Lucas Exline Searfoss is an 68 y.o. female.  HPI: 68 yo female with 2 days of epigastric pain. Pain has been constant. She does not associate it with food. She has nauseated. She denies vomiting or diarrhea. She had a fever of 103 earlier today and her husband noted she was more somnolent at times. She has had similar pains in the past that she thought was GERD.  Past Medical History:  Diagnosis Date  . CKD (chronic kidney disease)   . Hypercholesteremia   . Hypertension     Past Surgical History:  Procedure Laterality Date  . BACK SURGERY    . REPLACEMENT TOTAL HIP W/  RESURFACING IMPLANTS      No family history on file.  Social History:  reports that she has never smoked. She has never used smokeless tobacco. She reports that she does not drink alcohol or use drugs.  Allergies:  Allergies  Allergen Reactions  . Sulfa Antibiotics Nausea Only  . Gatifloxacin Rash    Other reaction(s): Myalgias (Muscle Pain) Rash in mouth     Medications: I have reviewed the patient's current medications.  Results for orders placed or performed during the hospital encounter of 10/01/19 (from the past 48 hour(s))  Lactic acid, plasma     Status: None   Collection Time: 10/01/19  6:26 Harrison  Result Value Ref Range   Lactic Acid, Venous 1.1 0.5 - 1.9 mmol/L    Comment: Performed at [facility], [place] [address], [place], [place] [address]  Comprehensive metabolic panel     Status: Abnormal   Collection Time: 10/01/19  6:26 Harrison  Result Value Ref Range   Sodium 137 135 - 145 mmol/L   Potassium 3.5 3.5 - 5.1 mmol/L   Chloride 101 98 - 111 mmol/L   CO2 26 22 - 32 mmol/L   Glucose, Bld 133 (H) 70 - 99 mg/dL   BUN 11 8 - 23 mg/dL   Creatinine, Ser 1.00 0.44 - 1.00 mg/dL   Calcium 9.1 8.9 - 10.3 mg/dL   Total Protein 6.5 6.5 - 8.1 g/dL   Albumin 3.2 (L) 3.5 - 5.0 g/dL   AST 297 (H) 15 - 41 U/L   ALT 114 (H) 0 - 44  U/L   Alkaline Phosphatase 309 (H) 38 - 126 U/L   Total Bilirubin 2.9 (H) 0.3 - 1.2 mg/dL   GFR calc non Af Amer 59 (L) >60 mL/min   GFR calc Af Amer >60 >60 mL/min   Anion gap 10 5 - 15    Comment: Performed at [facility], [place] [address], [place], [place] [address]  CBC with Differential     Status: Abnormal   Collection Time: 10/01/19  6:26 Harrison  Result Value Ref Range   WBC 7.5 4.0 - 10.5 K/uL   RBC 4.38 3.87 - 5.11 MIL/uL   Hemoglobin 11.6 (L) 12.0 - 15.0 g/dL   HCT 36.8 36.0 - 46.0 %   MCV 84.0 80.0 - 100.0 fL   MCH 26.5 26.0 - 34.0 pg   MCHC 31.5 30.0 - 36.0 g/dL   RDW 14.5 11.5 - 15.5 %   Platelets 128 (L) 150 - 400 K/uL   nRBC 0.0 0.0 - 0.2 %   Neutrophils Relative % 88 %   Neutro Abs 6.7 1.7 - 7.7 K/uL   Lymphocytes Relative 5 %   Lymphs Abs 0.3 (L) 0.7 - 4.0  K/uL   Monocytes Relative 6 %   Monocytes Absolute 0.4 0.1 - 1.0 K/uL   Eosinophils Relative 0 %   Eosinophils Absolute 0.0 0.0 - 0.5 K/uL   Basophils Relative 0 %   Basophils Absolute 0.0 0.0 - 0.1 K/uL   Immature Granulocytes 1 %   Abs Immature Granulocytes 0.05 0.00 - 0.07 K/uL    Comment: Performed at [facility], [address] [place], [place] [address]  Lipase, blood     Status: None   Collection Time: 10/01/19  6:26 Harrison  Result Value Ref Range   Lipase 25 11 - 51 U/L    Comment: Performed at [facility], [address], [place], [place] [address]  Lactic acid, plasma     Status: None   Collection Time: 10/01/19  9:03 Harrison  Result Value Ref Range   Lactic Acid, Venous 0.7 0.5 - 1.9 mmol/L    Comment: Performed at [facility], [address] [place], [place] [address]    Ct Abdomen Pelvis W Contrast  Result Date: Suzanne Harrison CLINICAL DATA:  Generalized abdominal pain, nausea, vomiting, diarrhea, fever, SOB, and generalized weakness, and fatigue since yesterday. Hx CKD, back surgery, and total hip replacement. EXAM: CT ABDOMEN AND PELVIS WITH CONTRAST TECHNIQUE: Multidetector  CT imaging of the abdomen and pelvis was performed using the standard protocol following bolus administration of intravenous contrast. CONTRAST:  OMNIPAQUE IOHEXOL 300 MG/ML  SOLN COMPARISON:  None. FINDINGS: Lower chest: Clear lung bases.  Heart normal in size. Hepatobiliary: Normal liver. Abnormal gallbladder. Wall is thickened between 5-6 mm and over a cm. There is mild adjacent hazy inflammation. No visualized stone. No bile duct dilation. Pancreas: Unremarkable. No pancreatic ductal dilatation or surrounding inflammatory changes. Spleen: Normal in size without focal abnormality. Adrenals/Urinary Tract: Left greater than right renal cortical thinning. Symmetric renal enhancement and excretion. Multiple bilateral low-attenuation renal masses. Largest on the left, lower pole, 8.5 cm. Largest on the right lateral upper pole, 5 cm. 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 Other renal masses either clearly cysts or likely cyst, too small to characterize. No intrarenal stones. No hydronephrosis. Ureters normal in course and in caliber. Bladder is unremarkable. Stomach/Bowel: Stomach is unremarkable. Small bowel and colon are normal in caliber. No wall thickening. No inflammation. Mild generalized increased stool burden throughout the colon. No evidence of appendicitis. Vascular/Lymphatic: No significant vascular findings are present. No enlarged abdominal or pelvic lymph nodes. Reproductive: Uterus and bilateral adnexa are unremarkable. Other: No abdominal wall hernia or abnormality. No abdominopelvic ascites. Musculoskeletal: S/p long thoracolumbar fusion and bilateral total hip arthroplasties. Orthopedic hardware appears well seated. Sclerosis and cystic changes noted involving both SI joints. No acute fracture. IMPRESSION: 1. Abnormal gallbladder with significant wall thickening and mild adjacent inflammation. Suspect acute cholecystitis. No  stone visualized by CT. Consider follow-up right upper quadrant ultrasound to assess for non radiopaque stones. 2. No other acute abnormality within the abdomen or pelvis. Electronically Signed   By: Amie Portland M.D.   On: 10/01/2019 20:31   Dg Chest Portable 1 View  Result Date: Suzanne Harrison CLINICAL DATA:  Shortness of breath, history hypertension EXAM: PORTABLE CHEST 1 VIEW COMPARISON:  Portable exam 1936 hours compared to 06/29/2014 FINDINGS: Normal heart size, mediastinal contours, and pulmonary vascularity. Minimal RIGHT basilar atelectasis. Remaining lungs clear. No pleural effusion or pneumothorax. Osseous demineralization with prior thoracic spine fixation. BILATERAL glenohumeral  degenerative changes. IMPRESSION: Minimal RIGHT basilar atelectasis. Electronically Signed   By: Ulyses SouthwardMark  Boles M.D.   On: 10/01/2019 19:53    Review of Systems  Constitutional: Negative for chills and fever.  HENT: Negative for hearing loss.   Eyes: Negative for blurred vision and double vision.  Respiratory: Negative for cough and hemoptysis.   Cardiovascular: Negative for chest pain and palpitations.  Gastrointestinal: Positive for abdominal pain and nausea. Negative for diarrhea and vomiting.  Genitourinary: Negative for dysuria and urgency.  Musculoskeletal: Negative for myalgias and neck pain.  Skin: Negative for itching and rash.  Neurological: Negative for dizziness, tingling and headaches.  Endo/Heme/Allergies: Does not bruise/bleed easily.  Psychiatric/Behavioral: Negative for depression and suicidal ideas.   Blood pressure (!) 120/55, pulse (!) 158, temperature 99.8 F (37.7 C), temperature source Oral, resp. rate 20, SpO2 (!) 85 %. Physical Exam  Vitals reviewed. Constitutional: She is oriented to person, place, and time. She appears well-developed and well-nourished.  HENT:  Head: Normocephalic and atraumatic.  Eyes: Pupils are equal, round, and reactive to light. Conjunctivae and EOM are  normal.  Neck: Normal range of motion. Neck supple.  Cardiovascular: Normal rate and regular rhythm.  Respiratory: Effort normal and breath sounds normal.  GI: Soft. Bowel sounds are normal. She exhibits no distension. There is abdominal tenderness in the right upper quadrant and epigastric area. There is positive Murphy's sign.  Musculoskeletal: Normal range of motion.  Neurological: She is alert and oriented to person, place, and time.  Skin: Skin is warm and dry.  Psychiatric: She has a normal mood and affect. Her behavior is normal.    Assessment/Plan: 68 yo female with epigastric pain, transaminitis with bilirubin of 2.9 and CT showing thickened gallbladder wall consistent with cholecystitis. -admit to floor -IV abx -pain control -We discussed the etiology of her pain, we discussed treatment options and recommended surgery. We discussed details of surgery including general anesthesia, laparoscopic approach, identification of cystic duct and common bile duct. Ligation of cystic duct and cystic artery. Possible need for intraoperative cholangiogram or open procedure. Possible risks of common bile duct injury, liver injury, cystic duct leak, bleeding, infection, post-cholecystectomy syndrome. The patient showed good understanding and all questions were answered   De BlanchLuke Aaron Kinsly Hild Suzanne Harrison, Suzanne Harrison

## 2019-10-02 ENCOUNTER — Inpatient Hospital Stay (HOSPITAL_COMMUNITY): Payer: 59

## 2019-10-02 LAB — COMPREHENSIVE METABOLIC PANEL
ALT: 91 U/L — ABNORMAL HIGH (ref 0–44)
AST: 156 U/L — ABNORMAL HIGH (ref 15–41)
Albumin: 2.5 g/dL — ABNORMAL LOW (ref 3.5–5.0)
Alkaline Phosphatase: 291 U/L — ABNORMAL HIGH (ref 38–126)
Anion gap: 8 (ref 5–15)
BUN: 8 mg/dL (ref 8–23)
CO2: 26 mmol/L (ref 22–32)
Calcium: 8.3 mg/dL — ABNORMAL LOW (ref 8.9–10.3)
Chloride: 102 mmol/L (ref 98–111)
Creatinine, Ser: 0.95 mg/dL (ref 0.44–1.00)
GFR calc Af Amer: >60 mL/min (ref >60)
GFR calc non Af Amer: >60 mL/min (ref >60)
Glucose, Bld: 134 mg/dL — ABNORMAL HIGH (ref 70–99)
Potassium: 3.5 mmol/L (ref 3.5–5.1)
Sodium: 136 mmol/L (ref 135–145)
Total Bilirubin: 3 mg/dL — ABNORMAL HIGH (ref 0.3–1.2)
Total Protein: 5.8 g/dL — ABNORMAL LOW (ref 6.5–8.1)

## 2019-10-02 LAB — CBC
HCT: 33.3 % — ABNORMAL LOW (ref 36.0–46.0)
Hemoglobin: 10.4 g/dL — ABNORMAL LOW (ref 12.0–15.0)
MCH: 26.6 pg (ref 26.0–34.0)
MCHC: 31.2 g/dL (ref 30.0–36.0)
MCV: 85.2 fL (ref 80.0–100.0)
Platelets: 121 10*3/uL — ABNORMAL LOW (ref 150–400)
RBC: 3.91 MIL/uL (ref 3.87–5.11)
RDW: 14.6 % (ref 11.5–15.5)
WBC: 7.7 10*3/uL (ref 4.0–10.5)
nRBC: 0 % (ref 0.0–0.2)

## 2019-10-02 LAB — SARS CORONAVIRUS 2 (TAT 6-24 HRS): SARS Coronavirus 2: NEGATIVE

## 2019-10-02 LAB — HEPATITIS PANEL, ACUTE
HCV Ab: NONREACTIVE
Hep A IgM: NONREACTIVE
Hep B C IgM: NONREACTIVE
Hepatitis B Surface Ag: NONREACTIVE

## 2019-10-02 LAB — SURGICAL PCR SCREEN
MRSA, PCR: NEGATIVE
Staphylococcus aureus: NEGATIVE

## 2019-10-02 LAB — HIV ANTIBODY (ROUTINE TESTING W REFLEX): HIV Screen 4th Generation wRfx: NONREACTIVE

## 2019-10-02 MED ORDER — ACETAMINOPHEN 500 MG PO TABS: 1000.0000 mg | ORAL_TABLET | ORAL | Status: AC

## 2019-10-02 MED ORDER — CYCLOSPORINE 0.05 % OP EMUL
1.0000 [drp] | Freq: Two times a day (BID) | OPHTHALMIC | Status: DC
  Administered 2019-10-02 – 2019-10-06 (×9): 1 [drp] via OPHTHALMIC
  Filled 2019-10-02 (×10): qty 30

## 2019-10-02 MED ORDER — KETOTIFEN FUMARATE 0.025 % OP SOLN
1.0000 [drp] | Freq: Two times a day (BID) | OPHTHALMIC | Status: DC | PRN
  Administered 2019-10-04 – 2019-10-06 (×4): 1 [drp] via OPHTHALMIC
  Filled 2019-10-02 (×2): qty 5

## 2019-10-02 MED ORDER — ENSURE PRE-SURGERY PO LIQD
296.0000 mL | Freq: Once | ORAL | Status: AC
  Administered 2019-10-03: 296 mL via ORAL
  Filled 2019-10-02: qty 296

## 2019-10-02 MED ORDER — GADOBUTROL 1 MMOL/ML IV SOLN
7.0000 mL | Freq: Once | INTRAVENOUS | Status: AC | PRN
  Administered 2019-10-02: 7 mL via INTRAVENOUS

## 2019-10-02 NOTE — ED Notes (Signed)
Transport here to take pt to ultrasound. This nurse spoke with Lattie Haw RN on 6N, informed her of this, when done with [place] pt to go to assigned bed on 6N, 1 personal property bag and surgical consent accompany pt.

## 2019-10-02 NOTE — Progress Notes (Signed)
Received a called from Dr Towanda Malkin office to verify that it is OK for pt to get a MRCP.  Pt and MRI department was notified.

## 2019-10-02 NOTE — ED Notes (Signed)
Attempted report to 6N. No answer. Will draw labs and attempt to call again.

## 2019-10-02 NOTE — ED Notes (Signed)
Report given to [facility] RN. Attempting to draw labs before pt goes upstairs.

## 2019-10-02 NOTE — Progress Notes (Signed)
Subjective/Chief Complaint: Pain resolved, no n/v   Objective: Vital signs in last 24 hours: Temp:  [97.9 F (36.6 C)-99.8 F (37.7 C)] 97.9 F (36.6 C) (11/02 0639) Pulse Rate:  [55-158] 60 (11/02 0639) Resp:  [11-22] 16 (11/02 0639) BP: (101-132)/(43-70) 108/58 (11/02 0639) SpO2:  [92 %-99 %] 96 % (11/02 0639)    Intake/Output from previous day: 11/01 0701 - 11/02 0700 In: 1100 [IV Piggyback:1100] Out: -  Intake/Output this shift: No intake/output data recorded.  GI: soft nontender this am no murphys sign  Lab Results:  Recent Labs    10/01/19 1826  WBC 7.5  HGB 11.6*  HCT 36.8  PLT 128*   BMET Recent Labs    10/01/19 1826 10/02/19 0625  NA 137 136  K 3.5 3.5  CL 101 102  CO2 26 26  GLUCOSE 133* 134*  BUN 11 8  CREATININE 1.00 0.95  CALCIUM 9.1 8.3*   PT/INR No results for input(s): LABPROT, INR in the last 72 hours. ABG No results for input(s): PHART, HCO3 in the last 72 hours.  Invalid input(s): PCO2, PO2  Studies/Results: Ct Abdomen Pelvis W Contrast  Result Date: 10/01/2019 CLINICAL DATA:  Generalized abdominal pain, nausea, vomiting, diarrhea, fever, SOB, and generalized weakness, and fatigue since yesterday. Hx CKD, back surgery, and total hip replacement. EXAM: CT ABDOMEN AND PELVIS WITH CONTRAST TECHNIQUE: Multidetector CT imaging of the abdomen and pelvis was performed using the standard protocol following bolus administration of intravenous contrast. CONTRAST:  OMNIPAQUE IOHEXOL 300 MG/ML  SOLN COMPARISON:  None. FINDINGS: Lower chest: Clear lung bases.  Heart normal in size. Hepatobiliary: Normal liver. Abnormal gallbladder. Wall is thickened between 5-6 mm and over a cm. There is mild adjacent hazy inflammation. No visualized stone. No bile duct dilation. Pancreas: Unremarkable. No pancreatic ductal dilatation or surrounding inflammatory changes. Spleen: Normal in size without focal abnormality. Adrenals/Urinary Tract: Left greater  than right renal cortical thinning. Symmetric renal enhancement and excretion. Multiple bilateral low-attenuation renal masses. Largest on the left, lower pole, 8.5 cm. Largest on the right lateral upper pole, 5 cm. 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 Other renal masses either clearly cysts or likely cyst, too small to characterize. No intrarenal stones. No hydronephrosis. Ureters normal in course and in caliber. Bladder is unremarkable. Stomach/Bowel: Stomach is unremarkable. Small bowel and colon are normal in caliber. No wall thickening. No inflammation. Mild generalized increased stool burden throughout the colon. No evidence of appendicitis. Vascular/Lymphatic: No significant vascular findings are present. No enlarged abdominal or pelvic lymph nodes. Reproductive: Uterus and bilateral adnexa are unremarkable. Other: No abdominal wall hernia or abnormality. No abdominopelvic ascites. Musculoskeletal: S/p long thoracolumbar fusion and bilateral total hip arthroplasties. Orthopedic hardware appears well seated. Sclerosis and cystic changes noted involving both SI joints. No acute fracture. IMPRESSION: 1. Abnormal gallbladder with significant wall thickening and mild adjacent inflammation. Suspect acute cholecystitis. No stone visualized by CT. Consider follow-up right upper quadrant ultrasound to assess for non radiopaque stones. 2. No other acute abnormality within the abdomen or pelvis. Electronically Signed   By: Amie Portland M.D.   On: 10/01/2019 20:31   Dg Chest Portable 1 View  Result Date: 10/01/2019 CLINICAL DATA:  Shortness of breath, history hypertension EXAM: PORTABLE CHEST 1 VIEW COMPARISON:  Portable exam 1936 hours compared to 06/29/2014 FINDINGS: Normal heart size, mediastinal contours, and pulmonary vascularity. Minimal RIGHT basilar atelectasis. Remaining lungs clear. No  pleural effusion or pneumothorax. Osseous  demineralization with prior thoracic spine fixation. BILATERAL glenohumeral degenerative changes. IMPRESSION: Minimal RIGHT basilar atelectasis. Electronically Signed   By: Lavonia Dana M.D.   On: 10/01/2019 19:53   US Abdomen Limited Ruq  Result Date: 10/02/2019 CLINICAL DATA:  Elevated LFTs. History of hypertension, hyperlipidemia and chronic kidney disease. EXAM: ULTRASOUND ABDOMEN LIMITED RIGHT UPPER QUADRANT COMPARISON:  07/09/2011; CT abdomen and pelvis-10/01/2019 FINDINGS: Gallbladder: There is diffuse thickening of the gallbladder wall (measuring approximately 1.6 cm about the gallbladder fundus - image 10) with small amount of pericholecystic fluid. There are several echogenic gallstones and biliary sludge noted within the gallbladder with dominant stone measuring approximately 0.7 cm in diameter. No definitive sonographic Murphy's sign. Common bile duct: Diameter: Normal in size for age measuring 0.7 cm in diameter. Liver: Mildly coarsened echogenicity of the hepatic parenchyma. No discrete hepatic lesions. No definitive intrahepatic biliary ductal dilatation. No ascites. Portal vein is patent on color Doppler imaging with normal direction of blood flow towards the liver. Other: Incidentally noted approximately 4.8 x 4.4 x 4.4 cm anechoic cyst arising from the superior pole of the right kidney, similar to the 07/2011 examination. IMPRESSION: 1. Cholelithiasis with findings worrisome for acute cholecystitis including marked gallbladder wall thickening and trace amount of pericholecystic fluid, though patient denies pain with sonographic evaluation of the abnormal appearing gallbladder. If the clinical diagnosis remains uncertain, further evaluation with nuclear medicine HIDA scan could be performed as indicated. 2. Mild, slightly coarsened echogenicity of the hepatic parenchyma, nonspecific though could be seen in the setting of hepatic steatosis. 3. Incidentally noted approximately 4.8 cm right-sided  renal cyst, similar to the 07/2011 examination Electronically Signed   By: Sandi Mariscal M.D.   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

## 2019-10-03 ENCOUNTER — Inpatient Hospital Stay (HOSPITAL_COMMUNITY): Payer: 59 | Admitting: Certified Registered Nurse Anesthetist

## 2019-10-03 ENCOUNTER — Encounter (HOSPITAL_COMMUNITY): Payer: Self-pay | Admitting: *Deleted

## 2019-10-03 ENCOUNTER — Encounter (HOSPITAL_COMMUNITY): Admission: EM | Disposition: A | Discharge status: Home / Self Care | Payer: Self-pay | Source: Home / Self Care

## 2019-10-03 HISTORY — PX: BILIARY DRAINAGE: SHX1229

## 2019-10-03 HISTORY — PX: CHOLECYSTECTOMY: SHX55

## 2019-10-03 LAB — COMPREHENSIVE METABOLIC PANEL
ALT: 70 U/L — ABNORMAL HIGH (ref 0–44)
AST: 83 U/L — ABNORMAL HIGH (ref 15–41)
Albumin: 2.7 g/dL — ABNORMAL LOW (ref 3.5–5.0)
Alkaline Phosphatase: 283 U/L — ABNORMAL HIGH (ref 38–126)
Anion gap: 10 (ref 5–15)
BUN: 6 mg/dL — ABNORMAL LOW (ref 8–23)
CO2: 24 mmol/L (ref 22–32)
Calcium: 8.6 mg/dL — ABNORMAL LOW (ref 8.9–10.3)
Chloride: 105 mmol/L (ref 98–111)
Creatinine, Ser: 0.93 mg/dL (ref 0.44–1.00)
GFR calc Af Amer: >60 mL/min (ref >60)
GFR calc non Af Amer: >60 mL/min (ref >60)
Glucose, Bld: 89 mg/dL (ref 70–99)
Potassium: 3.4 mmol/L — ABNORMAL LOW (ref 3.5–5.1)
Sodium: 139 mmol/L (ref 135–145)
Total Bilirubin: 1.3 mg/dL — ABNORMAL HIGH (ref 0.3–1.2)
Total Protein: 6.1 g/dL — ABNORMAL LOW (ref 6.5–8.1)

## 2019-10-03 LAB — CBC
HCT: 35 % — ABNORMAL LOW (ref 36.0–46.0)
Hemoglobin: 10.9 g/dL — ABNORMAL LOW (ref 12.0–15.0)
MCH: 26.9 pg (ref 26.0–34.0)
MCHC: 31.1 g/dL (ref 30.0–36.0)
MCV: 86.4 fL (ref 80.0–100.0)
Platelets: 136 10*3/uL — ABNORMAL LOW (ref 150–400)
RBC: 4.05 MIL/uL (ref 3.87–5.11)
RDW: 14.9 % (ref 11.5–15.5)
WBC: 4.9 10*3/uL (ref 4.0–10.5)
nRBC: 0 % (ref 0.0–0.2)

## 2019-10-03 SURGERY — LAPAROSCOPIC CHOLECYSTECTOMY WITH INTRAOPERATIVE CHOLANGIOGRAM
Anesthesia: General | Site: Abdomen

## 2019-10-03 MED ORDER — ATENOLOL 50 MG PO TABS: 50.0000 mg | ORAL_TABLET | Freq: Once | ORAL | Status: DC

## 2019-10-03 MED ORDER — PHENYLEPHRINE 40 MCG/ML (10ML) SYRINGE FOR IV PUSH (FOR BLOOD PRESSURE SUPPORT)
PREFILLED_SYRINGE | INTRAVENOUS | Status: DC | PRN
  Administered 2019-10-03: 80 ug via INTRAVENOUS

## 2019-10-03 MED ORDER — MIDAZOLAM HCL 5 MG/5ML IJ SOLN
INTRAMUSCULAR | Status: DC | PRN
  Administered 2019-10-03: 2 mg via INTRAVENOUS

## 2019-10-03 MED ORDER — OXYCODONE HCL 5 MG/5ML PO SOLN: 5.0000 mg | Freq: Once | ORAL | Status: DC | PRN

## 2019-10-03 MED ORDER — PROPOFOL 10 MG/ML IV BOLUS
INTRAVENOUS | Status: AC
  Filled 2019-10-03: qty 20

## 2019-10-03 MED ORDER — PROMETHAZINE HCL 25 MG/ML IJ SOLN: 6.2500 mg | INTRAMUSCULAR | Status: DC | PRN

## 2019-10-03 MED ORDER — ONDANSETRON HCL 4 MG/2ML IJ SOLN
INTRAMUSCULAR | Status: AC
  Filled 2019-10-03: qty 2

## 2019-10-03 MED ORDER — LIDOCAINE 2% (20 MG/ML) 5 ML SYRINGE
INTRAMUSCULAR | Status: DC | PRN
  Administered 2019-10-03: 40 mg via INTRAVENOUS

## 2019-10-03 MED ORDER — LACTATED RINGERS IV SOLN
INTRAVENOUS | Status: DC
  Administered 2019-10-03: 14:00:00 via INTRAVENOUS

## 2019-10-03 MED ORDER — DEXAMETHASONE SODIUM PHOSPHATE 10 MG/ML IJ SOLN
INTRAMUSCULAR | Status: DC | PRN
  Administered 2019-10-03: 4 mg via INTRAVENOUS

## 2019-10-03 MED ORDER — FENTANYL CITRATE (PF) 250 MCG/5ML IJ SOLN
INTRAMUSCULAR | Status: DC | PRN
  Administered 2019-10-03 (×5): 50 ug via INTRAVENOUS
  Administered 2019-10-03: 100 ug via INTRAVENOUS

## 2019-10-03 MED ORDER — KETOROLAC TROMETHAMINE 30 MG/ML IJ SOLN
INTRAMUSCULAR | Status: AC
  Filled 2019-10-03: qty 1

## 2019-10-03 MED ORDER — DEXAMETHASONE SODIUM PHOSPHATE 10 MG/ML IJ SOLN
INTRAMUSCULAR | Status: AC
  Filled 2019-10-03: qty 1

## 2019-10-03 MED ORDER — PROPOFOL 10 MG/ML IV BOLUS
INTRAVENOUS | Status: DC | PRN
  Administered 2019-10-03: 200 mg via INTRAVENOUS

## 2019-10-03 MED ORDER — BUPIVACAINE-EPINEPHRINE 0.5% -1:200000 IJ SOLN
INTRAMUSCULAR | Status: DC | PRN
  Administered 2019-10-03: 13 mL

## 2019-10-03 MED ORDER — ROCURONIUM BROMIDE 10 MG/ML (PF) SYRINGE
PREFILLED_SYRINGE | INTRAVENOUS | Status: DC | PRN
  Administered 2019-10-03: 50 mg via INTRAVENOUS

## 2019-10-03 MED ORDER — ENOXAPARIN SODIUM 40 MG/0.4ML ~~LOC~~ SOLN: 40.0000 mg | SUBCUTANEOUS | Status: DC

## 2019-10-03 MED ORDER — FENTANYL CITRATE (PF) 250 MCG/5ML IJ SOLN
INTRAMUSCULAR | Status: AC
  Filled 2019-10-03: qty 5

## 2019-10-03 MED ORDER — BUPIVACAINE-EPINEPHRINE 0.5% -1:200000 IJ SOLN
INTRAMUSCULAR | Status: AC
  Filled 2019-10-03: qty 1

## 2019-10-03 MED ORDER — ONDANSETRON HCL 4 MG/2ML IJ SOLN
INTRAMUSCULAR | Status: DC | PRN
  Administered 2019-10-03: 4 mg via INTRAVENOUS

## 2019-10-03 MED ORDER — OXYCODONE HCL 5 MG PO TABS: 5.0000 mg | ORAL_TABLET | Freq: Once | ORAL | Status: DC | PRN

## 2019-10-03 MED ORDER — LABETALOL HCL 5 MG/ML IV SOLN
INTRAVENOUS | Status: DC | PRN
  Administered 2019-10-03: 5 mg via INTRAVENOUS

## 2019-10-03 MED ORDER — KETOROLAC TROMETHAMINE 30 MG/ML IJ SOLN
30.0000 mg | Freq: Once | INTRAMUSCULAR | Status: AC | PRN
  Administered 2019-10-03: 30 mg via INTRAVENOUS

## 2019-10-03 MED ORDER — HYDROMORPHONE HCL 1 MG/ML IJ SOLN
INTRAMUSCULAR | Status: AC
  Administered 2019-10-03: 0.5 mg via INTRAVENOUS
  Filled 2019-10-03: qty 1

## 2019-10-03 MED ORDER — LABETALOL HCL 5 MG/ML IV SOLN
INTRAVENOUS | Status: AC
  Filled 2019-10-03: qty 4

## 2019-10-03 MED ORDER — CEFAZOLIN SODIUM-DEXTROSE 2-4 GM/100ML-% IV SOLN
INTRAVENOUS | Status: AC
  Filled 2019-10-03: qty 100

## 2019-10-03 MED ORDER — BUPIVACAINE-EPINEPHRINE (PF) 0.25% -1:200000 IJ SOLN
INTRAMUSCULAR | Status: AC
  Filled 2019-10-03: qty 10

## 2019-10-03 MED ORDER — HYDROMORPHONE HCL 1 MG/ML IJ SOLN
0.2500 mg | INTRAMUSCULAR | Status: DC | PRN
  Administered 2019-10-03 (×2): 0.5 mg via INTRAVENOUS

## 2019-10-03 MED ORDER — 0.9 % SODIUM CHLORIDE (POUR BTL) OPTIME
TOPICAL | Status: DC | PRN
  Administered 2019-10-03: 1000 mL

## 2019-10-03 MED ORDER — SUGAMMADEX SODIUM 200 MG/2ML IV SOLN
INTRAVENOUS | Status: DC | PRN
  Administered 2019-10-03: 200 mg via INTRAVENOUS

## 2019-10-03 MED ORDER — ACETAMINOPHEN 500 MG PO TABS
1000.0000 mg | ORAL_TABLET | Freq: Once | ORAL | Status: AC
  Administered 2019-10-03: 1000 mg via ORAL
  Filled 2019-10-03: qty 2

## 2019-10-03 MED ORDER — ROCURONIUM BROMIDE 10 MG/ML (PF) SYRINGE
PREFILLED_SYRINGE | INTRAVENOUS | Status: AC
  Filled 2019-10-03: qty 10

## 2019-10-03 MED ORDER — MIDAZOLAM HCL 2 MG/2ML IJ SOLN
INTRAMUSCULAR | Status: AC
  Filled 2019-10-03: qty 2

## 2019-10-03 MED ORDER — CEFAZOLIN SODIUM-DEXTROSE 2-4 GM/100ML-% IV SOLN
2.0000 g | Freq: Once | INTRAVENOUS | Status: AC
  Administered 2019-10-03: 2 g via INTRAVENOUS

## 2019-10-03 MED ORDER — MEPERIDINE HCL 25 MG/ML IJ SOLN: 6.2500 mg | INTRAMUSCULAR | Status: DC | PRN

## 2019-10-03 MED ORDER — LIDOCAINE 2% (20 MG/ML) 5 ML SYRINGE
INTRAMUSCULAR | Status: AC
  Filled 2019-10-03: qty 5

## 2019-10-03 MED ORDER — SODIUM CHLORIDE 0.9 % IR SOLN
Status: DC | PRN
  Administered 2019-10-03 (×2): 1000 mL

## 2019-10-03 SURGICAL SUPPLY — 50 items
ADH SKN CLS APL DERMABOND .7 (GAUZE/BANDAGES/DRESSINGS) ×1
APPLIER CLIP 5 13 M/L LIGAMAX5 (MISCELLANEOUS) ×3
APR CLP MED LRG 5 ANG JAW (MISCELLANEOUS) ×1
BAG SPEC RTRVL 10 TROC 200 (ENDOMECHANICALS) ×1
BIOPATCH RED 1 DISK 7.0 (GAUZE/BANDAGES/DRESSINGS) ×2 IMPLANT
BIOPATCH RED 1IN DISK 7.0MM (GAUZE/BANDAGES/DRESSINGS) ×1
BLADE CLIPPER SURG (BLADE) IMPLANT
CANISTER SUCT 3000ML PPV (MISCELLANEOUS) ×3 IMPLANT
CHLORAPREP W/TINT 26 (MISCELLANEOUS) ×3 IMPLANT
CLIP APPLIE 5 13 M/L LIGAMAX5 (MISCELLANEOUS) ×1 IMPLANT
CLOSURE WOUND 1/2 X4 (GAUZE/BANDAGES/DRESSINGS) ×1
COVER MAYO STAND STRL (DRAPES) IMPLANT
COVER SURGICAL LIGHT HANDLE (MISCELLANEOUS) ×3 IMPLANT
COVER WAND RF STERILE (DRAPES) ×3 IMPLANT
DERMABOND ADVANCED (GAUZE/BANDAGES/DRESSINGS) ×2
DERMABOND ADVANCED .7 DNX12 (GAUZE/BANDAGES/DRESSINGS) ×1 IMPLANT
DRAIN CHANNEL 19F RND (DRAIN) ×3 IMPLANT
DRAPE C-ARM 42X120 X-RAY (DRAPES) IMPLANT
DRSG TEGADERM 4X4.75 (GAUZE/BANDAGES/DRESSINGS) ×3 IMPLANT
ELECT REM PT RETURN 9FT ADLT (ELECTROSURGICAL) ×3
ELECTRODE REM PT RTRN 9FT ADLT (ELECTROSURGICAL) ×1 IMPLANT
EVACUATOR SILICONE 100CC (DRAIN) ×3 IMPLANT
GLOVE BIO SURGEON STRL SZ7 (GLOVE) ×3 IMPLANT
GLOVE BIOGEL PI IND STRL 7.5 (GLOVE) ×1 IMPLANT
GLOVE BIOGEL PI INDICATOR 7.5 (GLOVE) ×2
GOWN STRL REUS W/ TWL LRG LVL3 (GOWN DISPOSABLE) ×3 IMPLANT
GOWN STRL REUS W/TWL LRG LVL3 (GOWN DISPOSABLE) ×9
GRASPER SUT TROCAR 14GX15 (MISCELLANEOUS) ×3 IMPLANT
KIT BASIN OR (CUSTOM PROCEDURE TRAY) ×3 IMPLANT
KIT TURNOVER KIT B (KITS) ×3 IMPLANT
NS IRRIG 1000ML POUR BTL (IV SOLUTION) ×3 IMPLANT
PAD ARMBOARD 7.5X6 YLW CONV (MISCELLANEOUS) ×3 IMPLANT
POUCH RETRIEVAL ECOSAC 10 (ENDOMECHANICALS) ×1 IMPLANT
POUCH RETRIEVAL ECOSAC 10MM (ENDOMECHANICALS) ×2
SCISSORS LAP 5X35 DISP (ENDOMECHANICALS) ×3 IMPLANT
SET CHOLANGIOGRAPH 5 50 .035 (SET/KITS/TRAYS/PACK) IMPLANT
SET IRRIG TUBING LAPAROSCOPIC (IRRIGATION / IRRIGATOR) ×3 IMPLANT
SET TUBE SMOKE EVAC HIGH FLOW (TUBING) ×3 IMPLANT
SLEEVE ENDOPATH XCEL 5M (ENDOMECHANICALS) ×6 IMPLANT
SPECIMEN JAR SMALL (MISCELLANEOUS) ×3 IMPLANT
STRIP CLOSURE SKIN 1/2X4 (GAUZE/BANDAGES/DRESSINGS) ×2 IMPLANT
SUT ETHILON 2 0 FS 18 (SUTURE) ×3 IMPLANT
SUT MNCRL AB 4-0 PS2 18 (SUTURE) ×3 IMPLANT
SUT VICRYL 0 UR6 27IN ABS (SUTURE) ×3 IMPLANT
TOWEL GREEN STERILE (TOWEL DISPOSABLE) ×3 IMPLANT
TOWEL GREEN STERILE FF (TOWEL DISPOSABLE) ×3 IMPLANT
TRAY LAPAROSCOPIC MC (CUSTOM PROCEDURE TRAY) ×3 IMPLANT
TROCAR XCEL BLUNT TIP 100MML (ENDOMECHANICALS) ×3 IMPLANT
TROCAR XCEL NON-BLD 5MMX100MML (ENDOMECHANICALS) ×3 IMPLANT
WATER STERILE IRR 1000ML POUR (IV SOLUTION) ×3 IMPLANT

## 2019-10-03 NOTE — Transfer of Care (Signed)
Immediate Anesthesia Transfer of Care Note  Patient: Suzanne Harrison  Procedure(s) Performed: ATTEMPTED LAPAROSCOPIC CHOLECYSTECTOMY WITH PLACEMENT OF DRAIN (N/A Abdomen)  Patient Location: PACU  Anesthesia Type:General  Level of Consciousness: drowsy  Airway & Oxygen Therapy: Patient Spontanous Breathing and Patient connected to nasal cannula oxygen  Post-op Assessment: Report given to RN and Post -op Vital signs reviewed and stable  Post vital signs: Reviewed and stable  Last Vitals:  Vitals Value Taken Time  BP 128/64 10/03/19 1638  Temp 36.1 C 10/03/19 1637  Pulse 86 10/03/19 1639  Resp 14 10/03/19 1639  SpO2 98 % 10/03/19 1639  Vitals shown include unvalidated device data.  Last Pain:  Vitals:   10/03/19 1413  TempSrc:   PainSc: 3       Patients Stated Pain Goal: 0 (57/01/77 9390)  Complications: No apparent anesthesia complications

## 2019-10-03 NOTE — Anesthesia Postprocedure Evaluation (Signed)
Anesthesia Post Note  Patient: Suzanne Harrison  Procedure(s) Performed: ATTEMPTED LAPAROSCOPIC CHOLECYSTECTOMY WITH PLACEMENT OF DRAIN (N/A Abdomen)     Patient location during evaluation: PACU Anesthesia Type: General Level of consciousness: awake and alert, oriented and patient cooperative Pain management: pain level controlled Vital Signs Assessment: post-procedure vital signs reviewed and stable Respiratory status: spontaneous breathing, nonlabored ventilation and respiratory function stable Cardiovascular status: blood pressure returned to baseline and stable Postop Assessment: no apparent nausea or vomiting Anesthetic complications: no    Last Vitals:  Vitals:   10/03/19 0500 10/03/19 1637  BP: (!) 144/80 128/64  Pulse: 68 87  Resp: 18 14  Temp: 37.2 C (!) 36.1 C  SpO2: 97% 98%    Last Pain:  Vitals:   10/03/19 1641  TempSrc:   PainSc: [place]

## 2019-10-03 NOTE — Progress Notes (Signed)
Patient ID: Suzanne Harrison, female   DOB: [birth year], 68 y.o.   MRN: [id number]       Subjective: Some nausea, but thinks its because she hasn't eaten.  No significant pain.  ROS: See above, otherwise other systems negative  Objective: Vital signs in last 24 hours: Temp:  [98.2 F (36.8 C)-99 F (37.2 C)] 99 F (37.2 C) (11/03 0500) Pulse Rate:  [58-68] 68 (11/03 0500) Resp:  [16-18] 18 (11/03 0500) BP: (106-145)/(55-80) 144/80 (11/03 0500) SpO2:  [97 %] 97 % (11/03 0500)    Intake/Output from previous day: 11/02 0701 - 11/03 0700 In: 861.7 [P.O.:200; I.V.:661.7] Out: -  Intake/Output this shift: No intake/output data recorded.  PE: Heart: regular Lungs: CTAB Abd: soft, essentially nontender, ND, obese  Lab Results:  Recent Labs    10/02/19 0625 10/03/19 0319  WBC 7.7 4.9  HGB 10.4* 10.9*  HCT 33.3* 35.0*  PLT 121* 136*   BMET Recent Labs    10/02/19 0625 10/03/19 0319  NA 136 139  K 3.5 3.4*  CL 102 105  CO2 26 24  GLUCOSE 134* 89  BUN 8 6*  CREATININE 0.95 0.93  CALCIUM 8.3* 8.6*   PT/INR No results for input(s): LABPROT, INR in the last 72 hours. CMP     Component Value Date/Time   NA 139 10/03/2019 0319   K 3.4 (L) 10/03/2019 0319   CL 105 10/03/2019 0319   CO2 24 10/03/2019 0319   GLUCOSE 89 10/03/2019 0319   BUN 6 (L) 10/03/2019 0319   CREATININE 0.93 10/03/2019 0319   CALCIUM 8.6 (L) 10/03/2019 0319   PROT 6.1 (L) 10/03/2019 0319   ALBUMIN 2.7 (L) 10/03/2019 0319   AST 83 (H) 10/03/2019 0319   ALT 70 (H) 10/03/2019 0319   ALKPHOS 283 (H) 10/03/2019 0319   BILITOT 1.3 (H) 10/03/2019 0319   GFRNONAA >60 10/03/2019 0319   GFRAA >60 10/03/2019 0319   Lipase     Component Value Date/Time   LIPASE 25 10/01/2019 1826       Studies/Results: Ct Abdomen Pelvis W Contrast  Result Date: 10/01/2019 CLINICAL DATA:  Generalized abdominal pain, nausea, vomiting, diarrhea, fever, SOB, and generalized weakness, and fatigue since  yesterday. Hx CKD, back surgery, and total hip replacement. EXAM: CT ABDOMEN AND PELVIS WITH CONTRAST TECHNIQUE: Multidetector CT imaging of the abdomen and pelvis was performed using the standard protocol following bolus administration of intravenous contrast. CONTRAST:  OMNIPAQUE IOHEXOL 300 MG/ML  SOLN COMPARISON:  None. FINDINGS: Lower chest: Clear lung bases.  Heart normal in size. Hepatobiliary: Normal liver. Abnormal gallbladder. Wall is thickened between 5-6 mm and over a cm. There is mild adjacent hazy inflammation. No visualized stone. No bile duct dilation. Pancreas: Unremarkable. No pancreatic ductal dilatation or surrounding inflammatory changes. Spleen: Normal in size without focal abnormality. Adrenals/Urinary Tract: Left greater than right renal cortical thinning. Symmetric renal enhancement and excretion. Multiple bilateral low-attenuation renal masses. Largest on the left, lower pole, 8.5 cm. Largest on the right lateral upper pole, 5 cm. 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 Other renal masses either clearly cysts or likely cyst, too small to characterize. No intrarenal stones. No hydronephrosis. Ureters normal in course and in caliber. Bladder is unremarkable. Stomach/Bowel: Stomach is unremarkable. Small bowel and colon are normal in caliber. No wall thickening. No inflammation. Mild generalized increased stool burden throughout the colon. No evidence of appendicitis. Vascular/Lymphatic: No significant  vascular findings are present. No enlarged abdominal or pelvic lymph nodes. Reproductive: Uterus and bilateral adnexa are unremarkable. Other: No abdominal wall hernia or abnormality. No abdominopelvic ascites. Musculoskeletal: S/p long thoracolumbar fusion and bilateral total hip arthroplasties. Orthopedic hardware appears well seated. Sclerosis and cystic changes noted involving both SI joints. No acute fracture.  IMPRESSION: 1. Abnormal gallbladder with significant wall thickening and mild adjacent inflammation. Suspect acute cholecystitis. No stone visualized by CT. Consider follow-up right upper quadrant ultrasound to assess for non radiopaque stones. 2. No other acute abnormality within the abdomen or pelvis. Electronically Signed   By: Lajean Manes M.D.   On: 10/01/2019 20:31   Mr 3d Recon At Scanner  Result Date: 10/03/2019 CLINICAL DATA:  Cholelithiasis. Abnormal liver function tests. Ultrasound findings worrisome for acute cholecystitis. EXAM: MRI ABDOMEN WITHOUT AND WITH CONTRAST (INCLUDING MRCP) TECHNIQUE: Multiplanar multisequence MR imaging of the abdomen was performed both before and after the administration of intravenous contrast. Heavily T2-weighted images of the biliary and pancreatic ducts were obtained, and three-dimensional MRCP images were rendered by post processing. CONTRAST:  74mL GADAVIST GADOBUTROL 1 MMOL/ML IV SOLN COMPARISON:  Ultrasound exam 10/02/2019.  CT scan 10/01/2019. FINDINGS: Lower chest: Unremarkable. Hepatobiliary: Evaluation of liver parenchyma is motion degraded. There is a 6 mm T1 hypointense, T2 hyperintense focus in the posterior right liver (segment VI) seen on image 35 of series 16. This lesion appears to fill in with enhancement on later post-contrast sequences but assessment is limited by the motion artifact. Gallbladder is distended with sludge and tiny dependent stones. There is irregular ill-defined gallbladder wall thickening and enhancement anteriorly in along the fundus, similar to recent CT scan. Non dependent 3 mm gallbladder polyp identified. No substantial intrahepatic biliary duct dilatation. Common bile duct measures 7-8 mm diameter. No evidence for choledocholithiasis. Pancreas: No focal mass lesion. No dilatation of the main duct. No intraparenchymal cyst. No peripancreatic edema. Spleen:  No splenomegaly. No focal mass lesion. Adrenals/Urinary Tract: No  adrenal nodule or mass. Bilateral renal cysts are evident including a 5.4 cm dominant Bosniak 1 cyst in the right kidney and a 8.9 cm dominant Bosniak II cyst in the lower pole left kidney. Stomach/Bowel: Stomach is nondilated. No small bowel or colonic dilatation within the visualized abdomen. Vascular/Lymphatic: No abdominal aortic aneurysm. No abdominal lymphadenopathy. Other:  No intraperitoneal free fluid. Musculoskeletal: Extensive thoracolumbar fusion hardware generates artifact through the spinal anatomy. IMPRESSION: 1. Dilated gallbladder with intraluminal sludge in tiny dependent gallstones. There is marked gallbladder wall thickening which is ill-defined and diffusely enhancing. Imaging features concerning for acute cholecystitis. 2. No evidence for choledocholithiasis. Common bile duct in the head of the pancreas measures 7-8 mm diameter. 3. Small gallbladder polyp noted towards the fundus. Electronically Signed   By: Misty Stanley M.D.   On: 10/03/2019 07:26   Dg Chest Portable 1 View  Result Date: 10/01/2019 CLINICAL DATA:  Shortness of breath, history hypertension EXAM: PORTABLE CHEST 1 VIEW COMPARISON:  Portable exam 1936 hours compared to 06/29/2014 FINDINGS: Normal heart size, mediastinal contours, and pulmonary vascularity. Minimal RIGHT basilar atelectasis. Remaining lungs clear. No pleural effusion or pneumothorax. Osseous demineralization with prior thoracic spine fixation. BILATERAL glenohumeral degenerative changes. IMPRESSION: Minimal RIGHT basilar atelectasis. Electronically Signed   By: Lavonia Dana M.D.   On: 10/01/2019 19:53   Mr Abdomen Mrcp Moise Boring Contast  Result Date: 10/03/2019 CLINICAL DATA:  Cholelithiasis. Abnormal liver function tests. Ultrasound findings worrisome for acute cholecystitis. EXAM: MRI ABDOMEN WITHOUT AND WITH CONTRAST (  INCLUDING MRCP) TECHNIQUE: Multiplanar multisequence MR imaging of the abdomen was performed both before and after the administration of  intravenous contrast. Heavily T2-weighted images of the biliary and pancreatic ducts were obtained, and three-dimensional MRCP images were rendered by post processing. CONTRAST:  7mL GADAVIST GADOBUTROL 1 MMOL/ML IV SOLN COMPARISON:  Ultrasound exam 10/02/2019.  CT scan 10/01/2019. FINDINGS: Lower chest: Unremarkable. Hepatobiliary: Evaluation of liver parenchyma is motion degraded. There is a 6 mm T1 hypointense, T2 hyperintense focus in the posterior right liver (segment VI) seen on image 35 of series 16. This lesion appears to fill in with enhancement on later post-contrast sequences but assessment is limited by the motion artifact. Gallbladder is distended with sludge and tiny dependent stones. There is irregular ill-defined gallbladder wall thickening and enhancement anteriorly in along the fundus, similar to recent CT scan. Non dependent 3 mm gallbladder polyp identified. No substantial intrahepatic biliary duct dilatation. Common bile duct measures 7-8 mm diameter. No evidence for choledocholithiasis. Pancreas: No focal mass lesion. No dilatation of the main duct. No intraparenchymal cyst. No peripancreatic edema. Spleen:  No splenomegaly. No focal mass lesion. Adrenals/Urinary Tract: No adrenal nodule or mass. Bilateral renal cysts are evident including a 5.4 cm dominant Bosniak 1 cyst in the right kidney and a 8.9 cm dominant Bosniak II cyst in the lower pole left kidney. Stomach/Bowel: Stomach is nondilated. No small bowel or colonic dilatation within the visualized abdomen. Vascular/Lymphatic: No abdominal aortic aneurysm. No abdominal lymphadenopathy. Other:  No intraperitoneal free fluid. Musculoskeletal: Extensive thoracolumbar fusion hardware generates artifact through the spinal anatomy. IMPRESSION: 1. Dilated gallbladder with intraluminal sludge in tiny dependent gallstones. There is marked gallbladder wall thickening which is ill-defined and diffusely enhancing. Imaging features concerning for  acute cholecystitis. 2. No evidence for choledocholithiasis. Common bile duct in the head of the pancreas measures 7-8 mm diameter. 3. Small gallbladder polyp noted towards the fundus. Electronically Signed   By: Kennith Center M.D.   On: 10/03/2019 07:26   US Abdomen Limited Ruq  Result Date: 10/02/2019 CLINICAL DATA:  Elevated LFTs. History of hypertension, hyperlipidemia and chronic kidney disease. EXAM: ULTRASOUND ABDOMEN LIMITED RIGHT UPPER QUADRANT COMPARISON:  07/09/2011; CT abdomen and pelvis-10/01/2019 FINDINGS: Gallbladder: There is diffuse thickening of the gallbladder wall (measuring approximately 1.6 cm about the gallbladder fundus - image 10) with small amount of pericholecystic fluid. There are several echogenic gallstones and biliary sludge noted within the gallbladder with dominant stone measuring approximately 0.7 cm in diameter. No definitive sonographic Murphy's sign. Common bile duct: Diameter: Normal in size for age measuring 0.7 cm in diameter. Liver: Mildly coarsened echogenicity of the hepatic parenchyma. No discrete hepatic lesions. No definitive intrahepatic biliary ductal dilatation. No ascites. Portal vein is patent on color Doppler imaging with normal direction of blood flow towards the liver. Other: Incidentally noted approximately 4.8 x 4.4 x 4.4 cm anechoic cyst arising from the superior pole of the right kidney, similar to the 07/2011 examination. IMPRESSION: 1. Cholelithiasis with findings worrisome for acute cholecystitis including marked gallbladder wall thickening and trace amount of pericholecystic fluid, though patient denies pain with sonographic evaluation of the abnormal appearing gallbladder. If the clinical diagnosis remains uncertain, further evaluation with nuclear medicine HIDA scan could be performed as indicated. 2. Mild, slightly coarsened echogenicity of the hepatic parenchyma, nonspecific though could be seen in the setting of hepatic steatosis. 3. Incidentally  noted approximately 4.8 cm right-sided renal cyst, similar to the 07/2011 examination Electronically Signed   By: Simonne Come  M.D.   On: 10/02/2019 08:11    Anti-infectives: Anti-infectives (From admission, onward)   Start     Dose/Rate Route Frequency Ordered Stop   10/01/19 2200  cefTRIAXone (ROCEPHIN) 2 g in sodium chloride 0.9 % 100 mL IVPB     2 g 200 mL/hr over 30 Minutes Intravenous Every 24 hours 10/01/19 2141         Assessment/Plan Cholecystitis  -MRCP negative for anything but a thick walled gallbladder -plan for OR today for lap chole.  Discussed possible subtotal potential given appearance of gallbladder as well as post op JP drain, and risks and complications. -NPO -I have explained the procedure, risks, and aftercare of cholecystectomy.  Risks include but are not limited to bleeding, infection, wound problems, anesthesia, diarrhea, bile leak, injury to common bile duct/liver/intestine.  She seems to understand and agrees to proceed.  FEN - NPO VTE - Lovenox, plan for resumption tomorrow ID - Rocephin   LOS: 2 days    Letha CapeKelly E Anais Koenen , [facility] 10/03/2019, 8:22 AM Please see Amion for pager number during day hours 7:00am-4:30pm

## 2019-10-03 NOTE — Progress Notes (Signed)
Patient ID: Suzanne Harrison, female   DOB: 24-Apr-1951, 68 y.o.   MRN: [id number] I called and left a message both at home and on her husbands cell phone as well as checked her room. I have been unable to contact him

## 2019-10-03 NOTE — Anesthesia Preprocedure Evaluation (Addendum)
Anesthesia Evaluation  Patient identified by MRN, date of birth, ID band Patient awake    Reviewed: Allergy & Precautions, NPO status , Patient's Chart, lab work & pertinent test results, reviewed documented beta blocker date and time   Airway Mallampati: II  TM Distance: >3 FB Neck ROM: Full    Dental no notable dental hx. (+) Partial Lower   Pulmonary neg pulmonary ROS,    Pulmonary exam normal breath sounds clear to auscultation       Cardiovascular hypertension, Pt. on medications and Pt. on home beta blockers negative cardio ROS Normal cardiovascular exam Rhythm:Regular Rate:Normal     Neuro/Psych PSYCHIATRIC DISORDERS Depression negative neurological ROS     GI/Hepatic Neg liver ROS, GERD  Medicated and Controlled,  Endo/Other  Hypothyroidism   Renal/GU negative Renal ROS  negative genitourinary   Musculoskeletal  (+) Arthritis , Osteoarthritis,    Abdominal   Peds negative pediatric ROS (+)  Hematology negative hematology ROS (+)   Anesthesia Other Findings HLD  Pain meds: lyrica, percocet  Lower bridge loose but does not come out per pt  Has not received her home beta blocker in 2 days   Reproductive/Obstetrics negative OB ROS                            Anesthesia Physical Anesthesia Plan  ASA: III  Anesthesia Plan: General   Post-op Pain Management:    Induction: Intravenous  PONV Risk Score and Plan: 3 and Ondansetron, Dexamethasone, Midazolam and Treatment may vary due to age or medical condition  Airway Management Planned: Oral ETT  Additional Equipment: None  Intra-op Plan:   Post-operative Plan: Extubation in OR  Informed Consent: I have reviewed the patients History and Physical, chart, labs and discussed the procedure including the risks, benefits and alternatives for the proposed anesthesia with the patient or authorized representative who has indicated  his/her understanding and acceptance.     Dental advisory given  Plan Discussed with: CRNA  Anesthesia Plan Comments:         Anesthesia Quick Evaluation

## 2019-10-03 NOTE — Op Note (Signed)
Preoperative diagnosis: likely resolved choledocholithiasis, cholecystitis Postoperative diagnosis: same as above Procedure: attempted laparoscopic cholecystectomy, drain placement Surgeon Dr Serita Grammes Asst Wyonia Hough, PA-C Anesthesia general Drains 42 Fr Blake to ruq Specimens none EBL: 50 cc Complications none Sponge and needle count correct dispo to recovery stable  Indications: This is a 64yof who was admitted Sunday with epigastric pain.  She had elevated lfts and a ct scan that showed a thick gallbladder wall up to over a cm.  There were no stones and no bile duct dilatation.  She was admitted by one of my partners for cholecystitis.  She then had an [place] that showed diffuse thickening of the gallbladder wall measuring up to 1.6 cm about the fundus.  There were several stones and sludge present.  The CBD was 72mm.  Due to my concern for appearance of wall and increased lfts I did send for an mrcp as well. This does not show a mass. This shows a dilated gb with sludge and tiny stones.  There is marked gbw thickening and is enhancing.  There is no choledocholithiasis. I discussed with her after this proceeding to or for lap chole.  Procedure: After informed consent was obtained from the patient and I discussed with her husband over the phone she was taken to the OR.  She was given antibiotics. SCDs were in place. She was placded under general anesthesia without complication.  She was prepped and draped in standard sterile surgical fashion. A surgical timeout was performed.  I infiltrated marcaine below the umbilicus. I made a vertical incision. I identified the fascia.  I incised the fascia and entered the peritoneum bluntly. I placed a 0 vicryl pursestring suture.  I then inserted a hasson trocar and insufflated the abdomen. I then inserted three further five mm trocars in the epigastrium and ruq. The gallbladder was not able to be identified. The omentum was stuck to the liver as was the  colon. The stomach was very close to the inflammatory process as well.  I was able to release the omentum from the liver and eventually was able to see just the top of the gallbladder. I was unable to grasp it due to the thick wall.  I was able to dissect down several cm and it appeared that the stomach and duodenum were adherent to the galbladder and were not going to come down easily.  I did not think there was any way to do a subtotal or get down to find the structures in the triangle without a major operation or injury.  I also thought that putting a drain in by me would not be a good idea as opposed to an IR drain that traverses the liver. I elected to abort the procedure today due to concern for injury.  I did place a 19 Fr Blake drain around the gallbladder and the dissection. This was secured with a 2-0 nylon. I irrigated.  I then removed the hasson trocar and tied the pursestring down. I then placed an additional 0 vicryl suture there.  I removed remaining trocars. Incision were closed with 4-0 monocryl and glue. She was extubated and taken to pacu stable.  I have discussed a perc chole with IR for tomorrow.

## 2019-10-03 NOTE — Anesthesia Procedure Notes (Signed)
Procedure Name: Intubation Date/Time: 10/03/2019 3:10 PM Performed by: Colin Benton, CRNA Pre-anesthesia Checklist: Patient identified, Emergency Drugs available, Suction available and Patient being monitored Patient Re-evaluated:Patient Re-evaluated prior to induction Oxygen Delivery Method: Circle system utilized Preoxygenation: Pre-oxygenation with 100% oxygen Induction Type: IV induction Ventilation: Mask ventilation without difficulty Laryngoscope Size: Miller and 2 Grade View: Grade I Tube type: Oral Tube size: 7.0 mm Number of attempts: 1 Airway Equipment and Method: Stylet Placement Confirmation: ETT inserted through vocal cords under direct vision,  positive ETCO2 and breath sounds checked- equal and bilateral Secured at: 21 cm Tube secured with: Tape Dental Injury: Teeth and Oropharynx as per pre-operative assessment

## 2019-10-04 ENCOUNTER — Encounter (HOSPITAL_COMMUNITY): Payer: Self-pay | Admitting: General Surgery

## 2019-10-04 ENCOUNTER — Inpatient Hospital Stay (HOSPITAL_COMMUNITY): Payer: 59

## 2019-10-04 LAB — CBC
HCT: 32.5 % — ABNORMAL LOW (ref 36.0–46.0)
Hemoglobin: 10.2 g/dL — ABNORMAL LOW (ref 12.0–15.0)
MCH: 26.4 pg (ref 26.0–34.0)
MCHC: 31.4 g/dL (ref 30.0–36.0)
MCV: 84 fL (ref 80.0–100.0)
Platelets: 146 10*3/uL — ABNORMAL LOW (ref 150–400)
RBC: 3.87 MIL/uL (ref 3.87–5.11)
RDW: 14.5 % (ref 11.5–15.5)
WBC: 5 10*3/uL (ref 4.0–10.5)
nRBC: 0 % (ref 0.0–0.2)

## 2019-10-04 LAB — COMPREHENSIVE METABOLIC PANEL
ALT: 52 U/L — ABNORMAL HIGH (ref 0–44)
AST: 44 U/L — ABNORMAL HIGH (ref 15–41)
Albumin: 2.4 g/dL — ABNORMAL LOW (ref 3.5–5.0)
Alkaline Phosphatase: 260 U/L — ABNORMAL HIGH (ref 38–126)
Anion gap: 9 (ref 5–15)
BUN: 6 mg/dL — ABNORMAL LOW (ref 8–23)
CO2: 24 mmol/L (ref 22–32)
Calcium: 8.5 mg/dL — ABNORMAL LOW (ref 8.9–10.3)
Chloride: 105 mmol/L (ref 98–111)
Creatinine, Ser: 0.84 mg/dL (ref 0.44–1.00)
GFR calc Af Amer: >60 mL/min (ref >60)
GFR calc non Af Amer: >60 mL/min (ref >60)
Glucose, Bld: 190 mg/dL — ABNORMAL HIGH (ref 70–99)
Potassium: 3.8 mmol/L (ref 3.5–5.1)
Sodium: 138 mmol/L (ref 135–145)
Total Bilirubin: 0.3 mg/dL (ref 0.3–1.2)
Total Protein: 5.8 g/dL — ABNORMAL LOW (ref 6.5–8.1)

## 2019-10-04 LAB — PROTIME-INR
INR: 1.1 (ref 0.8–1.2)
Prothrombin Time: 13.6 seconds (ref 11.4–15.2)

## 2019-10-04 MED ORDER — LIDOCAINE HCL 1 % IJ SOLN
INTRAMUSCULAR | Status: AC
  Filled 2019-10-04: qty 20

## 2019-10-04 MED ORDER — MIDAZOLAM HCL 2 MG/2ML IJ SOLN
INTRAMUSCULAR | Status: AC
  Filled 2019-10-04: qty 4

## 2019-10-04 MED ORDER — SODIUM CHLORIDE 0.9% FLUSH
5.0000 mL | Freq: Three times a day (TID) | INTRAVENOUS | Status: DC
  Administered 2019-10-04 – 2019-10-06 (×5): 5 mL

## 2019-10-04 MED ORDER — SODIUM CHLORIDE 0.9 % IV SOLN
INTRAVENOUS | Status: AC | PRN
  Administered 2019-10-04: 10 mL/h via INTRAVENOUS

## 2019-10-04 MED ORDER — MORPHINE SULFATE (PF) 2 MG/ML IV SOLN
2.0000 mg | INTRAVENOUS | Status: DC | PRN
  Administered 2019-10-04: 4 mg via INTRAVENOUS
  Administered 2019-10-04 – 2019-10-05 (×4): 2 mg via INTRAVENOUS
  Filled 2019-10-04 (×3): qty 1
  Filled 2019-10-04: qty 2
  Filled 2019-10-04: qty 1

## 2019-10-04 MED ORDER — ENOXAPARIN SODIUM 40 MG/0.4ML ~~LOC~~ SOLN
40.0000 mg | SUBCUTANEOUS | Status: DC
  Administered 2019-10-04 – 2019-10-05 (×2): 40 mg via SUBCUTANEOUS
  Filled 2019-10-04 (×2): qty 0.4

## 2019-10-04 MED ORDER — OXYCODONE HCL 5 MG PO TABS
5.0000 mg | ORAL_TABLET | ORAL | Status: DC | PRN
  Administered 2019-10-04: 10 mg via ORAL
  Filled 2019-10-04: qty 2

## 2019-10-04 MED ORDER — FENTANYL CITRATE (PF) 100 MCG/2ML IJ SOLN
INTRAMUSCULAR | Status: AC | PRN
  Administered 2019-10-04 (×3): 50 ug via INTRAVENOUS

## 2019-10-04 MED ORDER — LORAZEPAM 2 MG/ML IJ SOLN: 1.0000 mg | Freq: Once | INTRAMUSCULAR | Status: DC | PRN

## 2019-10-04 MED ORDER — MIDAZOLAM HCL 2 MG/2ML IJ SOLN
INTRAMUSCULAR | Status: AC | PRN
  Administered 2019-10-04 (×2): 1 mg via INTRAVENOUS

## 2019-10-04 MED ORDER — CEFAZOLIN SODIUM-DEXTROSE 2-4 GM/100ML-% IV SOLN
2.0000 g | INTRAVENOUS | Status: DC
  Filled 2019-10-04: qty 100

## 2019-10-04 MED ORDER — LIDOCAINE-EPINEPHRINE 1 %-1:100000 IJ SOLN
INTRAMUSCULAR | Status: AC
  Filled 2019-10-04: qty 1

## 2019-10-04 MED ORDER — FENTANYL CITRATE (PF) 100 MCG/2ML IJ SOLN
INTRAMUSCULAR | Status: AC
  Filled 2019-10-04: qty 4

## 2019-10-04 NOTE — Procedures (Signed)
Pre procedural Dx: Acute cholecysitis Post procedural Dx: Same  Technically successful [place] and CT guided placed of a 10 Fr drainage catheter placement into the gall bladder.    EBL: Minimal  Complications: None immediate  Ronny Bacon, MD Pager #: [phone]

## 2019-10-04 NOTE — Consult Note (Signed)
Chief Complaint: Patient was seen in consultation today for percutaneous cholecystostomy drain placement Chief Complaint  Patient presents with   Abdominal Pain   Emesis   Diarrhea   Shortness of Breath   Fever   at the request of Dr Darnelle Spangle   Supervising Physician: Simonne Come  Patient Status: [facility] - In-pt  History of Present Illness: Suzanne Harrison is a 68 y.o. female   Cholecystitis +stones and sludge per imaging Wall thickening MRCP:IMPRESSION: 1. Dilated gallbladder with intraluminal sludge in tiny dependent gallstones. There is marked gallbladder wall thickening which is ill-defined and diffusely enhancing. Imaging features concerning for acute cholecystitis. 2. No evidence for choledocholithiasis. Common bile duct in the head of the pancreas measures 7-8 mm diameter. 3. Small gallbladder polyp noted towards the fundus.   To OR for lap chole with Dr Dwain Sarna yesterday Unsuccessful lap chole- see OP note Request for IR to place percutaneous drain  Imaging and chart reviewed by Dr Domenick Gong procedure   Past Medical History:  Diagnosis Date   CKD (chronic kidney disease)    Hypercholesteremia    Hypertension     Past Surgical History:  Procedure Laterality Date   BACK SURGERY     CHOLECYSTECTOMY N/A 10/03/2019   Procedure: ATTEMPTED LAPAROSCOPIC CHOLECYSTECTOMY WITH PLACEMENT OF DRAIN;  Surgeon: Emelia Loron, MD;  Location: [facility] OR;  Service: General;  Laterality: N/A;   REPLACEMENT TOTAL HIP W/  RESURFACING IMPLANTS      Allergies: Gatifloxacin and Sulfa antibiotics  Medications: Prior to Admission medications   Medication Sig Start Date End Date Taking? Authorizing Provider  amoxicillin (AMOXIL) 500 MG tablet Take 2,000 mg by mouth See admin instructions. Take four capsules (2000 mg) by mouth one hour prior to any procedure 02/25/18  Yes [provider]  aspirin EC 81 MG tablet Take 81 mg by mouth at bedtime.    Yes [provider]  atenolol (TENORMIN) 50 MG tablet Take 50 mg by mouth daily. 09/18/19  Yes [provider]  Calcium Carbonate-Vitamin D (CALCIUM-D PO) Take 2 tablets by mouth at bedtime.   Yes [provider]  cetirizine (ZYRTEC) 10 MG tablet Take 10 mg by mouth daily.   Yes [provider]  citalopram (CELEXA) 20 MG tablet Take 20 mg by mouth daily. 02/23/18  Yes [provider]  cycloSPORINE (RESTASIS) 0.05 % ophthalmic emulsion Place 1 drop into both eyes 2 (two) times daily.   Yes [provider]  levothyroxine (SYNTHROID, LEVOTHROID) 50 MCG tablet Take 50 mcg by mouth daily. 02/15/18  Yes [provider]  lisinopril (PRINIVIL,ZESTRIL) 5 MG tablet Take 5 mg by mouth daily. 02/09/18  Yes [provider]  Multiple Vitamin (MULTIVITAMIN WITH MINERALS) TABS tablet Take 1 tablet by mouth at bedtime.   Yes [provider]  Multiple Vitamins-Minerals (HAIR/SKIN/NAILS/BIOTIN) TABS Take 1 tablet by mouth 2 (two) times daily.   Yes [provider]  oxybutynin (DITROPAN-XL) 10 MG 24 hr tablet Take 10 mg by mouth daily. 02/13/18  Yes [provider]  oxyCODONE-acetaminophen (PERCOCET) 10-325 MG tablet Take 1 tablet by mouth every 6 (six) hours as needed for pain.  02/16/18  Yes [provider]  pantoprazole (PROTONIX) 40 MG tablet Take 40 mg by mouth 2 (two) times daily. 02/11/18  Yes [provider]  pregabalin (LYRICA) 150 MG capsule Take 150 mg by mouth 2 (two) times daily.    Yes [provider]  rosuvastatin (CRESTOR) 10 MG tablet Take 10  mg by mouth at bedtime.  02/15/18  Yes [provider]  DENTA 5000 PLUS 1.1 % CREA dental cream Place 1 application onto teeth 2 (two) times daily. Do not eat, drink or rinse for 30 minutes after use. 08/12/19   [provider]     History reviewed. No pertinent family history.  Social History   Socioeconomic History    Marital status: Married    Spouse name: Not on file   Number of children: Not on file   Years of education: Not on file   Highest education level: Not on file  Occupational History   Not on file  Social Needs   Financial resource strain: Not on file   Food insecurity    Worry: Not on file    Inability: Not on file   Transportation needs    Medical: Not on file    Non-medical: Not on file  Tobacco Use   Smoking status: Never Smoker   Smokeless tobacco: Never Used  Substance and Sexual Activity   Alcohol use: No   Drug use: No   Sexual activity: Not on file  Lifestyle   Physical activity    Days per week: Not on file    Minutes per session: Not on file   Stress: Not on file  Relationships   Social connections    Talks on phone: Not on file    Gets together: Not on file    Attends religious service: Not on file    Active member of club or organization: Not on file    Attends meetings of clubs or organizations: Not on file    Relationship status: Not on file  Other Topics Concern   Not on file  Social History Narrative   Not on file    Review of Systems: A 12 point ROS discussed and pertinent positives are indicated in the HPI above.  All other systems are negative.  Review of Systems  Constitutional: Positive for activity change, appetite change and fatigue. Negative for fever and unexpected weight change.  Respiratory: Negative for cough and shortness of breath.   Cardiovascular: Negative for chest pain.  Gastrointestinal: Positive for abdominal pain and nausea.  Neurological: Positive for weakness.  Psychiatric/Behavioral: Negative for behavioral problems and confusion.    Vital Signs: BP (!) 149/83    Pulse 62    Temp 97.7 F (36.5 C) (Oral)    Resp 18    Ht 5\' 2"  (1.575 m)    Wt 170 lb (77.1 kg)    LMP  (LMP Unknown)    SpO2 98%    BMI 31.09 kg/m   Physical Exam Vitals signs reviewed.  Cardiovascular:     Rate and Rhythm: Normal rate and  regular rhythm.  Pulmonary:     Breath sounds: Normal breath sounds.  Abdominal:     General: Bowel sounds are normal.     Palpations: Abdomen is soft.     Tenderness: There is generalized abdominal tenderness and tenderness in the right upper quadrant.  Skin:    General: Skin is warm and dry.  Neurological:     Mental Status: She is alert and oriented to person, place, and time.  Psychiatric:        Behavior: Behavior normal.     Imaging: Ct Abdomen Pelvis W Contrast  Result Date: 10/01/2019 CLINICAL DATA:  Generalized abdominal pain, nausea, vomiting, diarrhea, fever, SOB, and generalized weakness, and fatigue since yesterday. Hx CKD, back surgery, and total hip  replacement. EXAM: CT ABDOMEN AND PELVIS WITH CONTRAST TECHNIQUE: Multidetector CT imaging of the abdomen and pelvis was performed using the standard protocol following bolus administration of intravenous contrast. CONTRAST:  100mL OMNIPAQUE IOHEXOL 300 MG/ML  SOLN COMPARISON:  None. FINDINGS: Lower chest: Clear lung bases.  Heart normal in size. Hepatobiliary: Normal liver. Abnormal gallbladder. Wall is thickened between 5-6 mm and over a cm. There is mild adjacent hazy inflammation. No visualized stone. No bile duct dilation. Pancreas: Unremarkable. No pancreatic ductal dilatation or surrounding inflammatory changes. Spleen: Normal in size without focal abnormality. Adrenals/Urinary Tract: Left greater than right renal cortical thinning. Symmetric renal enhancement and excretion. Multiple bilateral low-attenuation renal masses. Largest on the left, lower pole, 8.5 cm. Largest on the right lateral upper pole, 5 cm. 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 Other renal masses either clearly cysts or likely cyst, too small to characterize. No intrarenal stones. No hydronephrosis. Ureters normal in course and in caliber. Bladder is unremarkable. Stomach/Bowel: Stomach is  unremarkable. Small bowel and colon are normal in caliber. No wall thickening. No inflammation. Mild generalized increased stool burden throughout the colon. No evidence of appendicitis. Vascular/Lymphatic: No significant vascular findings are present. No enlarged abdominal or pelvic lymph nodes. Reproductive: Uterus and bilateral adnexa are unremarkable. Other: No abdominal wall hernia or abnormality. No abdominopelvic ascites. Musculoskeletal: S/p long thoracolumbar fusion and bilateral total hip arthroplasties. Orthopedic hardware appears well seated. Sclerosis and cystic changes noted involving both SI joints. No acute fracture. IMPRESSION: 1. Abnormal gallbladder with significant wall thickening and mild adjacent inflammation. Suspect acute cholecystitis. No stone visualized by CT. Consider follow-up right upper quadrant ultrasound to assess for non radiopaque stones. 2. No other acute abnormality within the abdomen or pelvis. Electronically Signed   By: Amie Portlandavid  Ormond M.D.   On: 10/01/2019 20:31   Mr 3d Recon At Scanner  Result Date: 10/03/2019 CLINICAL DATA:  Cholelithiasis. Abnormal liver function tests. Ultrasound findings worrisome for acute cholecystitis. EXAM: MRI ABDOMEN WITHOUT AND WITH CONTRAST (INCLUDING MRCP) TECHNIQUE: Multiplanar multisequence MR imaging of the abdomen was performed both before and after the administration of intravenous contrast. Heavily T2-weighted images of the biliary and pancreatic ducts were obtained, and three-dimensional MRCP images were rendered by post processing. CONTRAST:  7mL GADAVIST GADOBUTROL 1 MMOL/ML IV SOLN COMPARISON:  Ultrasound exam 10/02/2019.  CT scan 10/01/2019. FINDINGS: Lower chest: Unremarkable. Hepatobiliary: Evaluation of liver parenchyma is motion degraded. There is a 6 mm T1 hypointense, T2 hyperintense focus in the posterior right liver (segment VI) seen on image 35 of series 16. This lesion appears to fill in with enhancement on later  post-contrast sequences but assessment is limited by the motion artifact. Gallbladder is distended with sludge and tiny dependent stones. There is irregular ill-defined gallbladder wall thickening and enhancement anteriorly in along the fundus, similar to recent CT scan. Non dependent 3 mm gallbladder polyp identified. No substantial intrahepatic biliary duct dilatation. Common bile duct measures 7-8 mm diameter. No evidence for choledocholithiasis. Pancreas: No focal mass lesion. No dilatation of the main duct. No intraparenchymal cyst. No peripancreatic edema. Spleen:  No splenomegaly. No focal mass lesion. Adrenals/Urinary Tract: No adrenal nodule or mass. Bilateral renal cysts are evident including a 5.4 cm dominant Bosniak 1 cyst in the right kidney and a 8.9 cm dominant Bosniak II cyst in the lower pole left kidney. Stomach/Bowel: Stomach is nondilated. No small bowel or colonic dilatation within the visualized abdomen.  Vascular/Lymphatic: No abdominal aortic aneurysm. No abdominal lymphadenopathy. Other:  No intraperitoneal free fluid. Musculoskeletal: Extensive thoracolumbar fusion hardware generates artifact through the spinal anatomy. IMPRESSION: 1. Dilated gallbladder with intraluminal sludge in tiny dependent gallstones. There is marked gallbladder wall thickening which is ill-defined and diffusely enhancing. Imaging features concerning for acute cholecystitis. 2. No evidence for choledocholithiasis. Common bile duct in the head of the pancreas measures 7-8 mm diameter. 3. Small gallbladder polyp noted towards the fundus. Electronically Signed   By: Kennith Center M.D.   On: 10/03/2019 07:26   Dg Chest Portable 1 View  Result Date: 10/01/2019 CLINICAL DATA:  Shortness of breath, history hypertension EXAM: PORTABLE CHEST 1 VIEW COMPARISON:  Portable exam 1936 hours compared to 06/29/2014 FINDINGS: Normal heart size, mediastinal contours, and pulmonary vascularity. Minimal RIGHT basilar atelectasis.  Remaining lungs clear. No pleural effusion or pneumothorax. Osseous demineralization with prior thoracic spine fixation. BILATERAL glenohumeral degenerative changes. IMPRESSION: Minimal RIGHT basilar atelectasis. Electronically Signed   By: Ulyses Southward M.D.   On: 10/01/2019 19:53   Mr Abdomen Mrcp Vivien Rossetti Contast  Result Date: 10/03/2019 CLINICAL DATA:  Cholelithiasis. Abnormal liver function tests. Ultrasound findings worrisome for acute cholecystitis. EXAM: MRI ABDOMEN WITHOUT AND WITH CONTRAST (INCLUDING MRCP) TECHNIQUE: Multiplanar multisequence MR imaging of the abdomen was performed both before and after the administration of intravenous contrast. Heavily T2-weighted images of the biliary and pancreatic ducts were obtained, and three-dimensional MRCP images were rendered by post processing. CONTRAST:  7mL GADAVIST GADOBUTROL 1 MMOL/ML IV SOLN COMPARISON:  Ultrasound exam 10/02/2019.  CT scan 10/01/2019. FINDINGS: Lower chest: Unremarkable. Hepatobiliary: Evaluation of liver parenchyma is motion degraded. There is a 6 mm T1 hypointense, T2 hyperintense focus in the posterior right liver (segment VI) seen on image 35 of series 16. This lesion appears to fill in with enhancement on later post-contrast sequences but assessment is limited by the motion artifact. Gallbladder is distended with sludge and tiny dependent stones. There is irregular ill-defined gallbladder wall thickening and enhancement anteriorly in along the fundus, similar to recent CT scan. Non dependent 3 mm gallbladder polyp identified. No substantial intrahepatic biliary duct dilatation. Common bile duct measures 7-8 mm diameter. No evidence for choledocholithiasis. Pancreas: No focal mass lesion. No dilatation of the main duct. No intraparenchymal cyst. No peripancreatic edema. Spleen:  No splenomegaly. No focal mass lesion. Adrenals/Urinary Tract: No adrenal nodule or mass. Bilateral renal cysts are evident including a 5.4 cm dominant Bosniak  1 cyst in the right kidney and a 8.9 cm dominant Bosniak II cyst in the lower pole left kidney. Stomach/Bowel: Stomach is nondilated. No small bowel or colonic dilatation within the visualized abdomen. Vascular/Lymphatic: No abdominal aortic aneurysm. No abdominal lymphadenopathy. Other:  No intraperitoneal free fluid. Musculoskeletal: Extensive thoracolumbar fusion hardware generates artifact through the spinal anatomy. IMPRESSION: 1. Dilated gallbladder with intraluminal sludge in tiny dependent gallstones. There is marked gallbladder wall thickening which is ill-defined and diffusely enhancing. Imaging features concerning for acute cholecystitis. 2. No evidence for choledocholithiasis. Common bile duct in the head of the pancreas measures 7-8 mm diameter. 3. Small gallbladder polyp noted towards the fundus. Electronically Signed   By: Kennith Center M.D.   On: 10/03/2019 07:26   US Abdomen Limited Ruq  Result Date: 10/02/2019 CLINICAL DATA:  Elevated LFTs. History of hypertension, hyperlipidemia and chronic kidney disease. EXAM: ULTRASOUND ABDOMEN LIMITED RIGHT UPPER QUADRANT COMPARISON:  07/09/2011; CT abdomen and pelvis-10/01/2019 FINDINGS: Gallbladder: There is diffuse thickening of the gallbladder wall (measuring approximately  1.6 cm about the gallbladder fundus - image 10) with small amount of pericholecystic fluid. There are several echogenic gallstones and biliary sludge noted within the gallbladder with dominant stone measuring approximately 0.7 cm in diameter. No definitive sonographic Murphy's sign. Common bile duct: Diameter: Normal in size for age measuring 0.7 cm in diameter. Liver: Mildly coarsened echogenicity of the hepatic parenchyma. No discrete hepatic lesions. No definitive intrahepatic biliary ductal dilatation. No ascites. Portal vein is patent on color Doppler imaging with normal direction of blood flow towards the liver. Other: Incidentally noted approximately 4.8 x 4.4 x 4.4 cm  anechoic cyst arising from the superior pole of the right kidney, similar to the 07/2011 examination. IMPRESSION: 1. Cholelithiasis with findings worrisome for acute cholecystitis including marked gallbladder wall thickening and trace amount of pericholecystic fluid, though patient denies pain with sonographic evaluation of the abnormal appearing gallbladder. If the clinical diagnosis remains uncertain, further evaluation with nuclear medicine HIDA scan could be performed as indicated. 2. Mild, slightly coarsened echogenicity of the hepatic parenchyma, nonspecific though could be seen in the setting of hepatic steatosis. 3. Incidentally noted approximately 4.8 cm right-sided renal cyst, similar to the 07/2011 examination Electronically Signed   By: Simonne Come M.D.   On: 10/02/2019 08:11    Labs:  CBC: Recent Labs    10/01/19 1826 10/02/19 0625 10/03/19 0319 10/04/19 0201  WBC 7.5 7.7 4.9 5.0  HGB 11.6* 10.4* 10.9* 10.2*  HCT 36.8 33.3* 35.0* 32.5*  PLT 128* 121* 136* 146*    COAGS: Recent Labs    10/04/19 0737  INR 1.1    BMP: Recent Labs    10/01/19 1826 10/02/19 0625 10/03/19 0319 10/04/19 0201  NA 137 136 139 138  K 3.5 3.5 3.4* 3.8  CL 101 102 105 105  CO2 GLUCOSE 133* 134* 89 190*  BUN 11 8 6* 6*  CALCIUM 9.1 8.3* 8.6* 8.5*  CREATININE 1.00 0.95 0.93 0.84  GFRNONAA 59* >60 >60 >60  GFRAA >60 >60 >60 >60    LIVER FUNCTION TESTS: Recent Labs    10/01/19 1826 10/02/19 0625 10/03/19 0319 10/04/19 0201  BILITOT 2.9* 3.0* 1.3* 0.3  AST 297* 156* 83* 44*  ALT 114* 91* 70* 52*  ALKPHOS 309* 291* 283* 260*  PROT 6.5 5.8* 6.1* 5.8*  ALBUMIN 3.2* 2.5* 2.7* 2.4*    TUMOR MARKERS: No results for input(s): AFPTM, CEA, CA199, CHROMGRNA in the last 8760 hours.  Assessment and Plan:  Cholecystitis Unsuccessful OR attempt for cholecystectomy/drain Now scheduled for attempt for percutaneous cholecystostomy in IR Risks and benefits discussed with the  patient including, but not limited to bleeding, infection, gallbladder perforation, bile leak, sepsis or even death.  All of the patient's questions were answered, patient is agreeable to proceed. Consent signed and in chart.  Thank you for this interesting consult.  I greatly enjoyed meeting [place] F Samons and look forward to participating in their care.  A copy of this report was sent to the requesting provider on this date.  Electronically Signed: Robet Leu, PA-C 10/04/2019, 8:43 AM   I spent a total of 40 Minutes    in face to face in clinical consultation, greater than 50% of which was counseling/coordinating care for percutaneous cholecystostomy drain placement

## 2019-10-04 NOTE — Progress Notes (Signed)
1 Day Post-Op  Subjective: CC: Abdominal pain Patient reports that she had a bad night. She had waves of pain in her RUQ along with nausea. She understands that IR plans to place drain today. She is concerned about when she will be able to go back to cooking at home. She is concerned about not having her lyrica and celexa this AM.   Objective: Vital signs in last 24 hours: Temp:  [97 F (36.1 C)-97.7 F (36.5 C)] 97.7 F (36.5 C) (11/04 0443) Pulse Rate:  [62-87] 62 (11/04 0443) Resp:  [14-18] 18 (11/03 2025) BP: (114-169)/(56-84) 149/83 (11/04 0443) SpO2:  [93 %-100 %] 98 % (11/04 0443) Weight:  [77.1 kg] 77.1 kg (11/03 1413)    Intake/Output from previous day: 11/03 0701 - 11/04 0700 In: 1226.5 [I.V.:976.5; IV Piggyback:200] Out: 215 [Drains:190; Blood:25] Intake/Output this shift: No intake/output data recorded.  PE: Gen:  Alert, NAD, pleasant Card:  RRR Pulm:  CTAB, no W/R/R, effort normal Abd: Soft, ND, tenderness of the right upper quadrant and around laparoscopic incisions. +BS Drain with bloody SS output. 190cc since placement  Ext:  No erythema, edema, or tenderness BUE/BLE  Psych: A&Ox3  Skin: no rashes noted, warm and dry  Lab Results:  Recent Labs    10/03/19 0319 10/04/19 0201  WBC 4.9 5.0  HGB 10.9* 10.2*  HCT 35.0* 32.5*  PLT 136* 146*   BMET Recent Labs    10/03/19 0319 10/04/19 0201  NA 139 138  K 3.4* 3.8  CL 105 105  CO2 24 24  GLUCOSE 89 190*  BUN 6* 6*  CREATININE 0.93 0.84  CALCIUM 8.6* 8.5*   PT/INR No results for input(s): LABPROT, INR in the last 72 hours. CMP     Component Value Date/Time   NA 138 10/04/2019 0201   K 3.8 10/04/2019 0201   CL 105 10/04/2019 0201   CO2 24 10/04/2019 0201   GLUCOSE 190 (H) 10/04/2019 0201   BUN 6 (L) 10/04/2019 0201   CREATININE 0.84 10/04/2019 0201   CALCIUM 8.5 (L) 10/04/2019 0201   PROT 5.8 (L) 10/04/2019 0201   ALBUMIN 2.4 (L) 10/04/2019 0201   AST 44 (H) 10/04/2019 0201   ALT  52 (H) 10/04/2019 0201   ALKPHOS 260 (H) 10/04/2019 0201   BILITOT 0.3 10/04/2019 0201   GFRNONAA >60 10/04/2019 0201   GFRAA >60 10/04/2019 0201   Lipase     Component Value Date/Time   LIPASE 25 10/01/2019 1826       Studies/Results: Mr 3d Recon At Scanner  Result Date: 10/03/2019 CLINICAL DATA:  Cholelithiasis. Abnormal liver function tests. Ultrasound findings worrisome for acute cholecystitis. EXAM: MRI ABDOMEN WITHOUT AND WITH CONTRAST (INCLUDING MRCP) TECHNIQUE: Multiplanar multisequence MR imaging of the abdomen was performed both before and after the administration of intravenous contrast. Heavily T2-weighted images of the biliary and pancreatic ducts were obtained, and three-dimensional MRCP images were rendered by post processing. CONTRAST:  8mL GADAVIST GADOBUTROL 1 MMOL/ML IV SOLN COMPARISON:  Ultrasound exam 10/02/2019.  CT scan 10/01/2019. FINDINGS: Lower chest: Unremarkable. Hepatobiliary: Evaluation of liver parenchyma is motion degraded. There is a 6 mm T1 hypointense, T2 hyperintense focus in the posterior right liver (segment VI) seen on image 35 of series 16. This lesion appears to fill in with enhancement on later post-contrast sequences but assessment is limited by the motion artifact. Gallbladder is distended with sludge and tiny dependent stones. There is irregular ill-defined gallbladder wall thickening and enhancement anteriorly in  along the fundus, similar to recent CT scan. Non dependent 3 mm gallbladder polyp identified. No substantial intrahepatic biliary duct dilatation. Common bile duct measures 7-8 mm diameter. No evidence for choledocholithiasis. Pancreas: No focal mass lesion. No dilatation of the main duct. No intraparenchymal cyst. No peripancreatic edema. Spleen:  No splenomegaly. No focal mass lesion. Adrenals/Urinary Tract: No adrenal nodule or mass. Bilateral renal cysts are evident including a 5.4 cm dominant Bosniak 1 cyst in the right kidney and a 8.9 cm  dominant Bosniak II cyst in the lower pole left kidney. Stomach/Bowel: Stomach is nondilated. No small bowel or colonic dilatation within the visualized abdomen. Vascular/Lymphatic: No abdominal aortic aneurysm. No abdominal lymphadenopathy. Other:  No intraperitoneal free fluid. Musculoskeletal: Extensive thoracolumbar fusion hardware generates artifact through the spinal anatomy. IMPRESSION: 1. Dilated gallbladder with intraluminal sludge in tiny dependent gallstones. There is marked gallbladder wall thickening which is ill-defined and diffusely enhancing. Imaging features concerning for acute cholecystitis. 2. No evidence for choledocholithiasis. Common bile duct in the head of the pancreas measures 7-8 mm diameter. 3. Small gallbladder polyp noted towards the fundus. Electronically Signed   By: Kennith CenterEric  Mansell M.D.   On: 10/03/2019 07:26   Mr Abdomen Mrcp Vivien RossettiW Wo Contast  Result Date: 10/03/2019 CLINICAL DATA:  Cholelithiasis. Abnormal liver function tests. Ultrasound findings worrisome for acute cholecystitis. EXAM: MRI ABDOMEN WITHOUT AND WITH CONTRAST (INCLUDING MRCP) TECHNIQUE: Multiplanar multisequence MR imaging of the abdomen was performed both before and after the administration of intravenous contrast. Heavily T2-weighted images of the biliary and pancreatic ducts were obtained, and three-dimensional MRCP images were rendered by post processing. CONTRAST:  7mL GADAVIST GADOBUTROL 1 MMOL/ML IV SOLN COMPARISON:  Ultrasound exam 10/02/2019.  CT scan 10/01/2019. FINDINGS: Lower chest: Unremarkable. Hepatobiliary: Evaluation of liver parenchyma is motion degraded. There is a 6 mm T1 hypointense, T2 hyperintense focus in the posterior right liver (segment VI) seen on image 35 of series 16. This lesion appears to fill in with enhancement on later post-contrast sequences but assessment is limited by the motion artifact. Gallbladder is distended with sludge and tiny dependent stones. There is irregular  ill-defined gallbladder wall thickening and enhancement anteriorly in along the fundus, similar to recent CT scan. Non dependent 3 mm gallbladder polyp identified. No substantial intrahepatic biliary duct dilatation. Common bile duct measures 7-8 mm diameter. No evidence for choledocholithiasis. Pancreas: No focal mass lesion. No dilatation of the main duct. No intraparenchymal cyst. No peripancreatic edema. Spleen:  No splenomegaly. No focal mass lesion. Adrenals/Urinary Tract: No adrenal nodule or mass. Bilateral renal cysts are evident including a 5.4 cm dominant Bosniak 1 cyst in the right kidney and a 8.9 cm dominant Bosniak II cyst in the lower pole left kidney. Stomach/Bowel: Stomach is nondilated. No small bowel or colonic dilatation within the visualized abdomen. Vascular/Lymphatic: No abdominal aortic aneurysm. No abdominal lymphadenopathy. Other:  No intraperitoneal free fluid. Musculoskeletal: Extensive thoracolumbar fusion hardware generates artifact through the spinal anatomy. IMPRESSION: 1. Dilated gallbladder with intraluminal sludge in tiny dependent gallstones. There is marked gallbladder wall thickening which is ill-defined and diffusely enhancing. Imaging features concerning for acute cholecystitis. 2. No evidence for choledocholithiasis. Common bile duct in the head of the pancreas measures 7-8 mm diameter. 3. Small gallbladder polyp noted towards the fundus. Electronically Signed   By: Kennith CenterEric  Mansell M.D.   On: 10/03/2019 07:26    Anti-infectives: Anti-infectives (From admission, onward)   Start     Dose/Rate Route Frequency Ordered Stop   10/03/19 1500  ceFAZolin (ANCEF) IVPB 2g/100 mL premix     2 g 200 mL/hr over 30 Minutes Intravenous  Once 10/03/19 1448 10/03/19 1516   10/03/19 1449  ceFAZolin (ANCEF) 2-4 GM/100ML-% IVPB    Note to Pharmacy: Marga Melnick   : cabinet override      10/03/19 1449 10/03/19 1516   10/01/19 2200  cefTRIAXone (ROCEPHIN) 2 g in sodium chloride 0.9 %  100 mL IVPB     2 g 200 mL/hr over 30 Minutes Intravenous Every 24 hours 10/01/19 2141         Assessment/Plan HTN HLD CKD  Cholecystitis  -MRCP negative for anything but a thick walled gallbladder  - s/p attempted laparoscopic cholecystectomy with placement of a drain - Dr. Donne Hazel - 10/03/2019 - POD #1 - Plan for IR to place perc chole drain today.  -NPO - Cont abx   FEN - NPO VTE - SCDs ID - Rocephin 11/1 >>   LOS: 3 days    Jillyn Ledger , [facility] Surgery 10/04/2019, 8:10 AM Please see Amion for pager number during day hours 7:00am-4:30pm

## 2019-10-04 NOTE — Progress Notes (Addendum)
[place] for Lovenox Re-Start After IR Procedure Indication: VTE prophylaxis  Allergies  Allergen Reactions  . Gatifloxacin Rash and Other (See Comments)    Other reaction(s): Myalgias (Muscle Pain) Rash in mouth   . Sulfa Antibiotics Nausea Only    Patient Measurements: Height: 5\' 2"  (157.5 cm) Weight: 170 lb (77.1 kg) IBW/kg (Calculated) : 50.1  Vital Signs: Temp: 98.2 F (36.8 C) (11/04 1351) Temp Source: Oral (11/04 1351) BP: 148/74 (11/04 1351) Pulse Rate: 75 (11/04 1351)  Labs: Recent Labs    10/02/19 0625 10/03/19 0319 10/04/19 0201 10/04/19 0737  HGB 10.4* 10.9* 10.2*  --   HCT 33.3* 35.0* 32.5*  --   PLT 121* 136* 146*  --   LABPROT  --   --   --  13.6  INR  --   --   --  1.1  CREATININE 0.95 0.93 0.84  --     Estimated Creatinine Clearance: 63.3 mL/min (by C-G formula based on SCr of 0.84 mg/dL).   Medical History: Past Medical History:  Diagnosis Date  . CKD (chronic kidney disease)   . Hypercholesteremia   . Hypertension     Assessment: 68 yr old female S/P placement of 10 Fr drainage catheter into gallbladder this afternoon; procedure is standard bleeding risk, per consult.  Pt had order for Lovenox 40 mg SQ daily for VTE prophylaxis prior to procedure. H/H, platelets stable; CrCl 63.3 ml/min; renal function stable. Per RN, no bleeding observed post-procedure.  Goal of Therapy:  Prevention of VTE Monitor platelets by anticoagulation protocol: Yes   Plan:  Per Bedias protocol for restarting anticoagulants/antiplatelet agents post-IR procedures, restart Lovenox 40 mg SQ daily at least 6 hrs after procedure (restart this evening at 2300; discussed with RN) Monitor for signs/symptoms of bleeding  Gillermina Hu, PharmD, BCPS, [facility] Clinical Pharmacist  10/04/2019,5:22 PM

## 2019-10-05 ENCOUNTER — Encounter (HOSPITAL_COMMUNITY): Payer: Self-pay | Admitting: General Practice

## 2019-10-05 LAB — COMPREHENSIVE METABOLIC PANEL
ALT: 34 U/L (ref 0–44)
AST: 25 U/L (ref 15–41)
Albumin: 2.4 g/dL — ABNORMAL LOW (ref 3.5–5.0)
Alkaline Phosphatase: 198 U/L — ABNORMAL HIGH (ref 38–126)
Anion gap: 9 (ref 5–15)
BUN: <5 mg/dL — ABNORMAL LOW (ref 8–23)
CO2: 27 mmol/L (ref 22–32)
Calcium: 8.3 mg/dL — ABNORMAL LOW (ref 8.9–10.3)
Chloride: 105 mmol/L (ref 98–111)
Creatinine, Ser: 0.77 mg/dL (ref 0.44–1.00)
GFR calc Af Amer: >60 mL/min (ref >60)
GFR calc non Af Amer: >60 mL/min (ref >60)
Glucose, Bld: 106 mg/dL — ABNORMAL HIGH (ref 70–99)
Potassium: 3.1 mmol/L — ABNORMAL LOW (ref 3.5–5.1)
Sodium: 141 mmol/L (ref 135–145)
Total Bilirubin: 0.5 mg/dL (ref 0.3–1.2)
Total Protein: 5.4 g/dL — ABNORMAL LOW (ref 6.5–8.1)

## 2019-10-05 LAB — CBC
HCT: 30.3 % — ABNORMAL LOW (ref 36.0–46.0)
Hemoglobin: 9.5 g/dL — ABNORMAL LOW (ref 12.0–15.0)
MCH: 26.4 pg (ref 26.0–34.0)
MCHC: 31.4 g/dL (ref 30.0–36.0)
MCV: 84.2 fL (ref 80.0–100.0)
Platelets: 161 10*3/uL (ref 150–400)
RBC: 3.6 MIL/uL — ABNORMAL LOW (ref 3.87–5.11)
RDW: 14.7 % (ref 11.5–15.5)
WBC: 5.6 10*3/uL (ref 4.0–10.5)
nRBC: 0 % (ref 0.0–0.2)

## 2019-10-05 MED ORDER — LISINOPRIL 5 MG PO TABS
5.0000 mg | ORAL_TABLET | Freq: Every day | ORAL | Status: DC
  Administered 2019-10-05 – 2019-10-06 (×2): 5 mg via ORAL
  Filled 2019-10-05 (×2): qty 1

## 2019-10-05 MED ORDER — MORPHINE SULFATE (PF) 2 MG/ML IV SOLN: 2.0000 mg | INTRAVENOUS | Status: DC | PRN

## 2019-10-05 MED ORDER — POTASSIUM CHLORIDE CRYS ER 20 MEQ PO TBCR
40.0000 meq | EXTENDED_RELEASE_TABLET | Freq: Two times a day (BID) | ORAL | Status: AC
  Administered 2019-10-05 (×2): 40 meq via ORAL
  Filled 2019-10-05 (×2): qty 2

## 2019-10-05 MED ORDER — METHOCARBAMOL 500 MG PO TABS: 500.0000 mg | ORAL_TABLET | Freq: Four times a day (QID) | ORAL | Status: DC | PRN

## 2019-10-05 MED ORDER — OXYCODONE HCL 5 MG PO TABS
10.0000 mg | ORAL_TABLET | ORAL | Status: DC | PRN
  Administered 2019-10-05 – 2019-10-06 (×5): 10 mg via ORAL
  Filled 2019-10-05 (×5): qty 2

## 2019-10-05 MED ORDER — PREGABALIN 100 MG PO CAPS
100.0000 mg | ORAL_CAPSULE | Freq: Two times a day (BID) | ORAL | Status: DC
  Administered 2019-10-05 – 2019-10-06 (×3): 100 mg via ORAL
  Filled 2019-10-05 (×3): qty 1

## 2019-10-05 MED ORDER — METHOCARBAMOL 750 MG PO TABS
750.0000 mg | ORAL_TABLET | Freq: Four times a day (QID) | ORAL | Status: DC
  Administered 2019-10-05 – 2019-10-06 (×5): 750 mg via ORAL
  Filled 2019-10-05 (×5): qty 1

## 2019-10-05 MED ORDER — MORPHINE SULFATE (PF) 2 MG/ML IV SOLN
2.0000 mg | INTRAVENOUS | Status: DC | PRN
  Filled 2019-10-05: qty 1

## 2019-10-05 MED ORDER — ATENOLOL 50 MG PO TABS
50.0000 mg | ORAL_TABLET | Freq: Every day | ORAL | Status: DC
  Administered 2019-10-05 – 2019-10-06 (×2): 50 mg via ORAL
  Filled 2019-10-05 (×2): qty 1

## 2019-10-05 MED ORDER — ACETAMINOPHEN 500 MG PO TABS
1000.0000 mg | ORAL_TABLET | Freq: Four times a day (QID) | ORAL | Status: DC
  Administered 2019-10-05 – 2019-10-06 (×6): 1000 mg via ORAL
  Filled 2019-10-05 (×6): qty 2

## 2019-10-05 MED ORDER — METHOCARBAMOL 500 MG PO TABS: 500.0000 mg | ORAL_TABLET | Freq: Four times a day (QID) | ORAL | Status: DC

## 2019-10-05 MED ORDER — ROSUVASTATIN CALCIUM 5 MG PO TABS
10.0000 mg | ORAL_TABLET | Freq: Every day | ORAL | Status: DC
  Administered 2019-10-05: 10 mg via ORAL
  Filled 2019-10-05: qty 2

## 2019-10-05 MED ORDER — PANTOPRAZOLE SODIUM 40 MG PO TBEC
40.0000 mg | DELAYED_RELEASE_TABLET | Freq: Two times a day (BID) | ORAL | Status: DC
  Administered 2019-10-05 – 2019-10-06 (×3): 40 mg via ORAL
  Filled 2019-10-05 (×3): qty 1

## 2019-10-05 NOTE — Progress Notes (Signed)
2 Days Post-Op  Subjective: CC: Patient reports that she is still has moderate right upper quadrant abdominal pain.  She feels that her current oral pain medication regimen is not helping her. She toke Morphine x 4 yesterday and once this AM. She takes 10 mg Percocet every 6-8 hours at home for chronic back pain, along with Lyrica and Celexa.  She reports since having IR drain placed, she is only had liquids.  She is afraid to eat.  She did sit in the chair yesterday and walked to the restroom, but otherwise has not gotten out of bed.  She notes occasional waves of nausea.  These waves are not related to eating.  No emesis.  Objective: Vital signs in last 24 hours: Temp:  [98 F (36.7 C)-99.4 F (37.4 C)] 99 F (37.2 C) (11/05 0700) Pulse Rate:  [55-82] 79 (11/05 0700) Resp:  [10-18] 17 (11/05 0442) BP: (133-177)/(63-91) 145/70 (11/05 0700) SpO2:  [57 %-98 %] 96 % (11/05 0700)    Intake/Output from previous day: 11/04 0701 - 11/05 0700 In: 2281.6 [P.O.:540; I.V.:1461.6; IV Piggyback:100] Out: 280 [Drains:280] Intake/Output this shift: Total I/O In: 240 [P.O.:240] Out: -   PE: Gen:  Alert, NAD, pleasant Card:  RRR Pulm:  CTAB, no W/R/R, effort normal Abd: Soft, ND, tenderness of the right upper quadrant and around laparoscopic incisions. +BS. Laparoscopic incisions with dermabond and steri-strips in place. There is a small amount of redness around 2 sites but this appears more reactive than infectious. Drain with bloody SS output, 60cc/24 hours. Will d/c. Perc Chole drain with bile in bag. 220cc/24 hours.  Ext:  No erythema, edema, or tenderness BUE/BLE  Psych: A&Ox3  Skin: no rashes noted, warm and dry  Lab Results:  Recent Labs    10/04/19 0201 10/05/19 0215  WBC 5.0 5.6  HGB 10.2* 9.5*  HCT 32.5* 30.3*  PLT 146* 161   BMET Recent Labs    10/04/19 0201 10/05/19 0215  NA 138 141  K 3.8 3.1*  CL 105 105  CO2 24 27  GLUCOSE 190* 106*  BUN 6* <5*  CREATININE  0.84 0.77  CALCIUM 8.5* 8.3*   PT/INR Recent Labs    10/04/19 0737  LABPROT 13.6  INR 1.1   CMP     Component Value Date/Time   NA 141 10/05/2019 0215   K 3.1 (L) 10/05/2019 0215   CL 105 10/05/2019 0215   CO2 27 10/05/2019 0215   GLUCOSE 106 (H) 10/05/2019 0215   BUN <5 (L) 10/05/2019 0215   CREATININE 0.77 10/05/2019 0215   CALCIUM 8.3 (L) 10/05/2019 0215   PROT 5.4 (L) 10/05/2019 0215   ALBUMIN 2.4 (L) 10/05/2019 0215   AST 25 10/05/2019 0215   ALT 34 10/05/2019 0215   ALKPHOS 198 (H) 10/05/2019 0215   BILITOT 0.5 10/05/2019 0215   GFRNONAA >60 10/05/2019 0215   GFRAA >60 10/05/2019 0215   Lipase     Component Value Date/Time   LIPASE 25 10/01/2019 1826       Studies/Results: Ct Image Guided Drainage By Percutaneous Catheter  Result Date: 10/04/2019 INDICATION: Acute cholecystitis. Patient underwent attempted laparoscopic cholecystectomy however the procedure was aborted secondary to significant inflammation and scarring about the gallbladder fossa. As such, request made for placement of a image guided cholecystostomy tube for infection source purposes. Given recent operative attempt, decision was made to perform the cholecystostomy tube with CT / ultrasound guidance in lieu of routine fluoroscopic/ultrasound guidance. EXAM: ULTRASOUND AND  CT CHOLECYSTOSTOMY TUBE PLACEMENT COMPARISON:  CT abdomen and pelvis-10/01/2019; abdominal MRI-10/02/2019; abdominal ultrasound-10/02/2019 MEDICATIONS: The patient is currently admitted to the hospital and on intravenous antibiotics. Antibiotics were administered within an appropriate time frame prior to skin puncture. ANESTHESIA/SEDATION: Moderate (conscious) sedation was employed during this procedure. A total of Versed 1.5 mg and Fentanyl 150 mcg was administered intravenously. Moderate Sedation Time: 22 minutes. The patient's level of consciousness and vital signs were monitored continuously by radiology nursing throughout the  procedure under my direct supervision. CONTRAST:  None FLUOROSCOPY TIME:  N/A COMPLICATIONS: None immediate. PROCEDURE: Informed written consent was obtained from the patient after a discussion of the risks, benefits and alternatives to treatment. Questions regarding the procedure were encouraged and answered. A timeout was performed prior to the initiation of the procedure. Patient was positioned supine on the fluoroscopy table, slightly LPO. Location of the gallbladder was marked and the gallbladder was identified sonographically. Next, the right upper abdominal quadrant was prepped and draped in the usual sterile fashion, and a sterile drape was applied covering the operative field. A timeout was performed prior to the initiation of the procedure. Local anesthesia was provided with 1% lidocaine with epinephrine. Ultrasound scanning of the right upper quadrant demonstrates a markedly dilated gallbladder. Of note, the patient reported pain with ultrasound imaging over the gallbladder. Utilizing a transhepatic approach, an 18 gauge trocar needle was advanced into the gallbladder and a short Amplatz wire was coiled within the gallbladder lumen. An ultrasound image was saved for documentation purposes. Appropriate positioning was confirmed with limited CT imaging Next, the track was dilated allowing placement of a 10.2-French Cook cholecystomy tube into the gallbladder fossa, coiled and locked. Appropriate position was confirmed with CT imaging and the catheter was secured to the skin with suture, connected to a drainage bag and a dressing was placed. The patient tolerated the procedure well without immediate post procedural complication. IMPRESSION: Successful ultrasound and CT guided placement of a 10.2 French cholecystostomy tube. Electronically Signed   By: Sandi Mariscal M.D.   On: 10/04/2019 16:59    Anti-infectives: Anti-infectives (From admission, onward)   Start     Dose/Rate Route Frequency Ordered Stop    10/04/19 0900  ceFAZolin (ANCEF) IVPB 2g/100 mL premix  Status:  Discontinued     2 g 200 mL/hr over 30 Minutes Intravenous To Radiology 10/04/19 0852 10/04/19 1133   10/03/19 1500  ceFAZolin (ANCEF) IVPB 2g/100 mL premix     2 g 200 mL/hr over 30 Minutes Intravenous  Once 10/03/19 1448 10/03/19 1516   10/03/19 1449  ceFAZolin (ANCEF) 2-4 GM/100ML-% IVPB    Note to Pharmacy: Marga Melnick   : cabinet override      10/03/19 1449 10/03/19 1516   10/01/19 2200  cefTRIAXone (ROCEPHIN) 2 g in sodium chloride 0.9 % 100 mL IVPB     2 g 200 mL/hr over 30 Minutes Intravenous Every 24 hours 10/01/19 2141         Assessment/Plan HTN - Home meds HLD - home meds  CKD - Cr 0.77 Hypokalemia - Replace   Cholecystitis  - S/p attempted laparoscopic cholecystectomy with placement of a drain - Dr. Donne Hazel - 10/03/2019 - POD #2 - S/p IR Perc Chole drain, 10/04/2019 - MRCP pre-op negative for anything but a thick walled gallbladder  - Pull JP drain. Maintain Perc Chole drain.   - WBC and LFT's normalized.  - Adv diet  - Needs to mobilize. PT ordered  - Cont abx  -  Pain control - Pulm toliet, IS  FEN - HH diet  VTE - SCDs ID - Rocephin 11/1 >> Foley - None Follow-Up - Dr. Dwain Sarna Pain Control - Patient takes 10mg  Percocet q6hrs at home, along with Lyrica and Celexa for chronic back pain. Will adjust medications to try to wean off IV pain medication. Scheduled Tylenol, Robaxin, home Lyrica, home Celexa, PRN Oxy 10 q4hrs prn.    LOS: 4 days    , [facility] Surgery 10/05/2019, 8:42 AM Please see Amion for pager number during day hours 7:00am-4:30pm

## 2019-10-05 NOTE — Discharge Instructions (Addendum)
Cholecystostomy Cholecystostomy is a procedure to drain fluid from the gallbladder by using a flexible drainage tube (catheter). The gallbladder is a pear-shaped organ that lies beneath the liver on the right side of the body. The gallbladder stores bile, which is a fluid that helps the body digest fats. You may have this procedure:  If your gallbladder is infected due to gallstones (cholecystitis).  To control a gallbladder infection if you cannot have gallbladder surgery. Tell a health care provider about:  Any allergies you have.  All medicines you are taking, including vitamins, herbs, eye drops, creams, and over-the-counter medicines.  Any problems you or family members have had with anesthetic medicines.  Any blood disorders you have.  Any surgeries you have had.  Any medical conditions you have.  Whether you are pregnant or may be pregnant. What are the risks? Generally, this is a safe procedure. However, problems may occur, including:  The catheter moving out of place.  Clogging of the catheter.  Infection of the incision site.  Internal bleeding.  Leakage of bile from the gallbladder.  Infection inside the abdomen (peritonitis).  Damage to other structures or organs.  Low blood pressure and slowed heart rate.  Allergic reactions to medicines or dyes. What happens before the procedure? Most often, you will already be in the hospital receiving treatment for a gallbladder infection or other problems with the gallbladder. Staying hydrated Follow instructions from your health care provider about hydration, which may include:  Up to 2 hours before the procedure - you may continue to drink clear liquids, such as water, clear fruit juice, black coffee, and plain tea.  Eating and drinking Follow instructions from your health care provider about eating and drinking, which may include:  8 hours before the procedure - stop eating heavy meals or foods, such as meat,  fried foods, or fatty foods.  6 hours before the procedure - stop eating light meals or foods, such as toast or cereal.  6 hours before the procedure - stop drinking milk or drinks that contain milk.  2 hours before the procedure - stop drinking clear liquids. Medicines  Ask your health care provider about: ? Changing or stopping your regular medicines. This is especially important if you are taking diabetes medicines or blood thinners. ? Taking medicines such as aspirin and ibuprofen. These medicines can thin your blood. Do not take these medicines unless your health care provider tells you to take them. ? Taking over-the-counter medicines, vitamins, herbs, and supplements. General instructions  You may have an exam or testing, including: ? Imaging studies of your gallbladder. ? Blood tests.  Do not use any products that contain nicotine or tobacco before the procedure. These products include cigarettes, e-cigarettes, and chewing tobacco. If you need help quitting, ask your health care provider. Also, do not use these products after the procedure.  Plan to have someone take you home from the hospital or clinic.  If you will be going home right after the procedure, plan to have someone with you for 24 hours.  Ask your health care provider how your surgical site will be marked or identified.  Ask your health care provider what steps will be taken to help prevent infection. These may include: ? Removing hair at the surgery site. ? Washing skin with a germ-killing soap. ? Taking antibiotic medicine. What happens during the procedure?  An IV will be inserted into one of your veins.  You will be given one or more of the  following: ? A medicine to help you relax (sedative). ? A medicine to numb the area (local anesthetic).  A small incision will be made in your abdomen.  A long needle or a wide puncturing tool (trocar) will be put through the incision.  Your health care provider  will use an imaging study (ultrasound) to guide the needle or trocar into your gallbladder.  After the needle or trocar is in your gallbladder, a small amount of dye may be injected. An X-ray may be taken to make sure that the needle is in the correct place.  A catheter will be placed through the needle.  The needle or trocar will be removed.  The catheter will be secured to your skin with stitches (sutures).  The catheter will be connected to a drainage bag. Fluid will drain from the gallbladder into the bag. Some of this bile may be sent to the lab to be tested.  A bandage (dressing) will be placed over the incision site where the catheter was placed. The procedure may vary among health care providers and hospitals. What happens after the procedure?  Your blood pressure, heart rate, breathing rate, and blood oxygen level will be monitored until you leave the hospital or clinic.  Dye may be injected through your catheter to check the catheter and your gallbladder.  Your gallbladder may be flushed out (irrigated) through the catheter.  Your catheter and drainage bag may need to stay in place for several weeks or as told by your health care provider. Summary  Cholecystostomy is a procedure to drain fluid from the gallbladder using a flexible drainage tube (catheter).  Generally, this is a safe procedure. However, problems may occur, including bleeding, infection, clogging of the catheter, damage to other structures or organs, or low blood pressure and slowed heart rate.  Follow instructions before the procedure. You will be told when to stop all food and drink, whether to change or stop any medicines, and what tests need to be done.  After the procedure, you will be monitored in the hospital, and you may have a catheter and drainage bag when you go home. This information is not intended to replace advice given to you by your health care provider. Make sure you discuss any questions you  have with your health care provider. Document Released: 02/12/2009 Document Revised: 06/13/2018 Document Reviewed: 06/13/2018 Elsevier Patient Education  2020 [employer]  Please arrive at least 30 min before your appointment to complete your check in paperwork.  If you are unable to arrive 30 min prior to your appointment time we may have to cancel or reschedule you. LAPAROSCOPIC SURGERY: POST OP INSTRUCTIONS Always review your discharge instruction sheet given to you by the facility where your surgery was performed. IF YOU HAVE DISABILITY OR FAMILY LEAVE FORMS, YOU MUST BRING THEM TO THE OFFICE FOR PROCESSING.   DO NOT GIVE THEM TO YOUR DOCTOR.  PAIN CONTROL  1. First take acetaminophen (Tylenol) AND/or ibuprofen (Advil) to control your pain after surgery.  Follow directions on package.  Taking acetaminophen (Tylenol) and/or ibuprofen (Advil) regularly after surgery will help to control your pain and lower the amount of prescription pain medication you may need.  You should not take more than 4,000 mg (4 grams) of acetaminophen (Tylenol) in 24 hours.  You should not take ibuprofen (Advil), aleve, motrin, naprosyn or other NSAIDS if you have a history of stomach ulcers or chronic kidney disease.  2. A  prescription for pain medication may be given to you upon discharge.  Take your pain medication as prescribed, if you still have uncontrolled pain after taking acetaminophen (Tylenol) or ibuprofen (Advil). 3. Use ice packs to help control pain. 4. If you need a refill on your pain medication, please contact your pharmacy.  They will contact our office to request authorization. Prescriptions will not be filled after 5pm or on week-ends.  HOME MEDICATIONS 5. Take your usually prescribed medications unless otherwise directed.  DIET 6. You should follow a light diet the first few days after arrival home.  Be sure to include lots of fluids daily. Avoid fatty,  fried foods.   CONSTIPATION 7. It is common to experience some constipation after surgery and if you are taking pain medication.  Increasing fluid intake and taking a stool softener (such as Colace) will usually help or prevent this problem from occurring.  A mild laxative (Milk of Magnesia or Miralax) should be taken according to package instructions if there are no bowel movements after 48 hours.  WOUND/INCISION CARE 8. Most patients will experience some swelling and bruising in the area of the incisions.  Ice packs will help.  Swelling and bruising can take several days to resolve.  9. Unless discharge instructions indicate otherwise, follow guidelines below  a. STERI-STRIPS - you may remove your outer bandages 48 hours after surgery, and you may shower at that time.  You have steri-strips (small skin tapes) in place directly over the incision.  These strips should be left on the skin for 7-10 days.   b. DERMABOND/SKIN GLUE - you may shower in 24 hours.  The glue will flake off over the next 2-3 weeks. 10. Any sutures or staples will be removed at the office during your follow-up visit.  ACTIVITIES 11. You may resume regular (light) daily activities beginning the next day--such as daily self-care, walking, climbing stairs--gradually increasing activities as tolerated.  You may have sexual intercourse when it is comfortable.  Refrain from any heavy lifting or straining until approved by your doctor. a. You may drive when you are no longer taking prescription pain medication, you can comfortably wear a seatbelt, and you can safely maneuver your car and apply brakes.  FOLLOW-UP 12. You should see your doctor in the office for a follow-up appointment approximately 2-3 weeks after your surgery.  You should have been given your post-op/follow-up appointment when your surgery was scheduled.  If you did not receive a post-op/follow-up appointment, make sure that you call for this appointment within a day  or two after you arrive home to insure a convenient appointment time.   WHEN TO CALL YOUR DOCTOR: 1. Fever over 101.0 2. Inability to urinate 3. Continued bleeding from incision. 4. Increased pain, redness, or drainage from the incision. 5. Increasing abdominal pain  The clinic staff is available to answer your questions during regular business hours.  Please dont hesitate to call and ask to speak to one of the nurses for clinical concerns.  If you have a medical emergency, go to the nearest emergency room or call 911.  A surgeon from [facility] is always on call at the hospital. [address], [address], [place], [place]  [address] ? [address] [address], [place], [place]   [address] [phone] ? [phone] ? FAX [phone]

## 2019-10-05 NOTE — Evaluation (Signed)
Physical Therapy Evaluation Patient Details Name: Suzanne Harrison MRN: [id number] DOB: 07/10/1951 Today's Date: 10/05/2019   History of Present Illness  68 yo female admitted to ED on 11/1 with abdominal pain, N/V, weakness due to cholecystitis. Lap chole attempted on 11/3, but gallbladder was not able to be grasped and the stomach and duodenum were adhered to gallbladder, so surgery aborted and drain placed by IR on 11/4.  Clinical Impression   Pt presents with abdominal pain, difficulty performing bed mobility, increased time and effort to mobilize, decreased activity tolerance due to abdominal pain. Pt to benefit from acute PT to address deficits. Pt ambulated hallway distance with RW with min guard to supervision level of assist, pt requiring cuing for posture during gait. PT expects pt to progress well with mobility, and PT encouraged pt to walk with RN staff while acute. PT to progress mobility as tolerated, and will continue to follow acutely.      Follow Up Recommendations No PT follow up;Supervision - Intermittent    Equipment Recommendations  None recommended by PT    Recommendations for Other Services       Precautions / Restrictions Precautions Precautions: Fall Precaution Comments: abdominal- for pt comfort Restrictions Weight Bearing Restrictions: No      Mobility  Bed Mobility Overal bed mobility: Needs Assistance Bed Mobility: Supine to Sit     Supine to sit: Min assist;HOB elevated     General bed mobility comments: Min assist for LE lifting and translation to EOB, HOB elevated to assist with trunk elevation per pt request.  Transfers Overall transfer level: Needs assistance Equipment used: None Transfers: Sit to/from Stand Sit to Stand: Min guard         General transfer comment: min guard for safety, increased time to rise.  Ambulation/Gait Ambulation/Gait assistance: Supervision;Min guard Gait Distance (Feet): 170 Feet Assistive device:  Rolling walker (2 wheeled) Gait Pattern/deviations: Step-through pattern;Decreased stride length;Trunk flexed Gait velocity: slightly decr, guarded ambulation due to pain   General Gait Details: Min guard to supervision for safety, verbal cuing for upright posture limited by pt abdominal pain. Pt with moderate pace, no LOB or unsteadiness noted but was guarded due to abdominal pain.  Stairs            Wheelchair Mobility    Modified Rankin (Stroke Patients Only)       Balance Overall balance assessment: Mild deficits observed, not formally tested;History of Falls(pt reports one fall multiple years ago due to lacking rails on porch, since has been fixed)                                           Pertinent Vitals/Pain Pain Assessment: 0-10 Pain Score: 5  Pain Location: abdomen Pain Descriptors / Indicators: Sore;Discomfort Pain Intervention(s): Limited activity within patient's tolerance;Monitored during session;Repositioned;Patient requesting pain meds-RN notified    Home Living Family/patient expects to be discharged to:: Private residence Living Arrangements: Spouse/significant other Available Help at Discharge: Family;Available 24 hours/day(pt's husband took 2 weeks off of work to be with pt post-acutely) Type of Home: House Home Access: Stairs to enter Entrance Stairs-Rails: Doctor, general practice of Steps: 4 Home Layout: Able to live on main level with bedroom/bathroom Home Equipment: Walker - 2 wheels;Cane - single point;Bedside commode;Other (comment) Additional Comments: knee scooter    Prior Function Level of Independence: Independent with assistive device(s)  Comments: pt reports using cane for ambulation PTA     Hand Dominance   Dominant Hand: Right    Extremity/Trunk Assessment   Upper Extremity Assessment Upper Extremity Assessment: Overall WFL for tasks assessed    Lower Extremity Assessment Lower  Extremity Assessment: Overall WFL for tasks assessed    Cervical / Trunk Assessment Cervical / Trunk Assessment: Other exceptions Cervical / Trunk Exceptions: forward flexed trunk posture due to abdominal pain, correctable with verbal cuing from PT  Communication   Communication: No difficulties  Cognition Arousal/Alertness: Awake/alert Behavior During Therapy: WFL for tasks assessed/performed Overall Cognitive Status: Within Functional Limits for tasks assessed                                 General Comments: pt reports being sleepy, but cognition [facility]      General Comments      Exercises     Assessment/Plan    PT Assessment Patient needs continued PT services  PT Problem List Decreased mobility;Decreased safety awareness;Decreased activity tolerance;Pain       PT Treatment Interventions DME instruction;Therapeutic activities;Gait training;Patient/family education;Therapeutic exercise;Balance training;Functional mobility training;Stair training    PT Goals (Current goals can be found in the Care Plan section)  Acute Rehab PT Goals Patient Stated Goal: decrease abdominal pain, go home PT Goal Formulation: With patient Time For Goal Achievement: 10/19/19 Potential to Achieve Goals: Good    Frequency Min 3X/week   Barriers to discharge        Co-evaluation               AM-PAC PT "6 Clicks" Mobility  Outcome Measure Help needed turning from your back to your side while in a flat bed without using bedrails?: A Little Help needed moving from lying on your back to sitting on the side of a flat bed without using bedrails?: A Little Help needed moving to and from a bed to a chair (including a wheelchair)?: None Help needed standing up from a chair using your arms (e.g., wheelchair or bedside chair)?: None Help needed to walk in hospital room?: A Little Help needed climbing 3-5 steps with a railing? : A Little 6 Click Score: 20    End of Session  Equipment Utilized During Treatment: (pt politely declines gait belt) Activity Tolerance: Patient limited by pain;Patient limited by fatigue Patient left: in chair;with call bell/phone within reach(pt verbally agrees to wait for assist prior to mobilizing) Nurse Communication: Patient requests pain meds PT Visit Diagnosis: Difficulty in walking, not elsewhere classified (R26.2);Pain Pain - Right/Left: (mid) Pain - part of body: (abdomen)    Time: 4008-6761 PT Time Calculation (min) (ACUTE ONLY): 22 min   Charges:   PT Evaluation $PT Eval Low Complexity: 1 Low         Alexi Dorminey E, PT Acute Rehabilitation Services Pager [phone]  Office [phone]   Delvecchio Madole D Aaryn Parrilla 10/05/2019, 2:14 PM

## 2019-10-05 NOTE — Progress Notes (Signed)
Referring Physician(s): Dr Darnelle SpangleM [place]  Supervising Physician: Richarda OverlieHenn, Adam  Patient Status:  [facility] - In-pt  Chief Complaint:  cholecystitis   Subjective:  Perc chole drain placed in IR 11/4 Up in chair out of bed Eating reg diet Pain controlled Feeling better   Allergies: Gatifloxacin and Sulfa antibiotics  Medications: Prior to Admission medications   Medication Sig Start Date End Date Taking? Authorizing Provider  amoxicillin (AMOXIL) 500 MG tablet Take 2,000 mg by mouth See admin instructions. Take four capsules (2000 mg) by mouth one hour prior to any procedure 02/25/18  Yes [provider]  aspirin EC 81 MG tablet Take 81 mg by mouth at bedtime.   Yes [provider]  atenolol (TENORMIN) 50 MG tablet Take 50 mg by mouth daily. 09/18/19  Yes [provider]  Calcium Carbonate-Vitamin D (CALCIUM-D PO) Take 2 tablets by mouth at bedtime.   Yes [provider]  cetirizine (ZYRTEC) 10 MG tablet Take 10 mg by mouth daily.   Yes [provider]  citalopram (CELEXA) 20 MG tablet Take 20 mg by mouth daily. 02/23/18  Yes [provider]  cycloSPORINE (RESTASIS) 0.05 % ophthalmic emulsion Place 1 drop into both eyes 2 (two) times daily.   Yes [provider]  levothyroxine (SYNTHROID, LEVOTHROID) 50 MCG tablet Take 50 mcg by mouth daily. 02/15/18  Yes [provider]  lisinopril (PRINIVIL,ZESTRIL) 5 MG tablet Take 5 mg by mouth daily. 02/09/18  Yes [provider]  Multiple Vitamin (MULTIVITAMIN WITH MINERALS) TABS tablet Take 1 tablet by mouth at bedtime.   Yes [provider]  Multiple Vitamins-Minerals (HAIR/SKIN/NAILS/BIOTIN) TABS Take 1 tablet by mouth 2 (two) times daily.   Yes [provider]  oxybutynin (DITROPAN-XL) 10 MG 24 hr tablet Take 10 mg by mouth daily. 02/13/18  Yes [provider]  oxyCODONE-acetaminophen (PERCOCET) 10-325 MG tablet Take 1 tablet by mouth every  6 (six) hours as needed for pain.  02/16/18  Yes [provider]  pantoprazole (PROTONIX) 40 MG tablet Take 40 mg by mouth 2 (two) times daily. 02/11/18  Yes [provider]  pregabalin (LYRICA) 150 MG capsule Take 150 mg by mouth 2 (two) times daily.    Yes [provider]  rosuvastatin (CRESTOR) 10 MG tablet Take 10 mg by mouth at bedtime.  02/15/18  Yes [provider]  DENTA 5000 PLUS 1.1 % CREA dental cream Place 1 application onto teeth 2 (two) times daily. Do not eat, drink or rinse for 30 minutes after use. 08/12/19   [provider]     Vital Signs: BP (!) 145/70 (BP Location: Left Arm)    Pulse 79    Temp 99 F (37.2 C) (Oral)    Resp 17    Ht 5\' 2"  (1.575 m)    Wt 170 lb (77.1 kg)    LMP  (LMP Unknown)    SpO2 96%    BMI 31.09 kg/m   Physical Exam Skin:    General: Skin is warm and dry.     Comments: Site is clean and dry Tender OP bile  280 cc OP yesterday      Imaging: Ct Abdomen Pelvis W Contrast  Result Date: 10/01/2019 CLINICAL DATA:  Generalized abdominal pain, nausea, vomiting, diarrhea, fever, SOB, and generalized weakness, and fatigue since yesterday. Hx CKD, back surgery, and total hip replacement. EXAM: CT ABDOMEN AND PELVIS WITH CONTRAST TECHNIQUE: Multidetector CT imaging of the abdomen and pelvis was performed  using the standard protocol following bolus administration of intravenous contrast. CONTRAST:  OMNIPAQUE IOHEXOL 300 MG/ML  SOLN COMPARISON:  None. FINDINGS: Lower chest: Clear lung bases.  Heart normal in size. Hepatobiliary: Normal liver. Abnormal gallbladder. Wall is thickened between 5-6 mm and over a cm. There is mild adjacent hazy inflammation. No visualized stone. No bile duct dilation. Pancreas: Unremarkable. No pancreatic ductal dilatation or surrounding inflammatory changes. Spleen: Normal in size without focal abnormality. Adrenals/Urinary Tract: Left greater than right renal cortical thinning.  Symmetric renal enhancement and excretion. Multiple bilateral low-attenuation renal masses. Largest on the left, lower pole, 8.5 cm. Largest on the right lateral upper pole, 5 cm. 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 Other renal masses either clearly cysts or likely cyst, too small to characterize. No intrarenal stones. No hydronephrosis. Ureters normal in course and in caliber. Bladder is unremarkable. Stomach/Bowel: Stomach is unremarkable. Small bowel and colon are normal in caliber. No wall thickening. No inflammation. Mild generalized increased stool burden throughout the colon. No evidence of appendicitis. Vascular/Lymphatic: No significant vascular findings are present. No enlarged abdominal or pelvic lymph nodes. Reproductive: Uterus and bilateral adnexa are unremarkable. Other: No abdominal wall hernia or abnormality. No abdominopelvic ascites. Musculoskeletal: S/p long thoracolumbar fusion and bilateral total hip arthroplasties. Orthopedic hardware appears well seated. Sclerosis and cystic changes noted involving both SI joints. No acute fracture. IMPRESSION: 1. Abnormal gallbladder with significant wall thickening and mild adjacent inflammation. Suspect acute cholecystitis. No stone visualized by CT. Consider follow-up right upper quadrant ultrasound to assess for non radiopaque stones. 2. No other acute abnormality within the abdomen or pelvis. Electronically Signed   By: Amie Portland M.D.   On: 10/01/2019 20:31   Mr 3d Recon At Scanner  Result Date: 10/03/2019 CLINICAL DATA:  Cholelithiasis. Abnormal liver function tests. Ultrasound findings worrisome for acute cholecystitis. EXAM: MRI ABDOMEN WITHOUT AND WITH CONTRAST (INCLUDING MRCP) TECHNIQUE: Multiplanar multisequence MR imaging of the abdomen was performed both before and after the administration of intravenous contrast. Heavily T2-weighted images of the biliary and  pancreatic ducts were obtained, and three-dimensional MRCP images were rendered by post processing. CONTRAST:  7mL GADAVIST GADOBUTROL 1 MMOL/ML IV SOLN COMPARISON:  Ultrasound exam 10/02/2019.  CT scan 10/01/2019. FINDINGS: Lower chest: Unremarkable. Hepatobiliary: Evaluation of liver parenchyma is motion degraded. There is a 6 mm T1 hypointense, T2 hyperintense focus in the posterior right liver (segment VI) seen on image 35 of series 16. This lesion appears to fill in with enhancement on later post-contrast sequences but assessment is limited by the motion artifact. Gallbladder is distended with sludge and tiny dependent stones. There is irregular ill-defined gallbladder wall thickening and enhancement anteriorly in along the fundus, similar to recent CT scan. Non dependent 3 mm gallbladder polyp identified. No substantial intrahepatic biliary duct dilatation. Common bile duct measures 7-8 mm diameter. No evidence for choledocholithiasis. Pancreas: No focal mass lesion. No dilatation of the main duct. No intraparenchymal cyst. No peripancreatic edema. Spleen:  No splenomegaly. No focal mass lesion. Adrenals/Urinary Tract: No adrenal nodule or mass. Bilateral renal cysts are evident including a 5.4 cm dominant Bosniak 1 cyst in the right kidney and a 8.9 cm dominant Bosniak II cyst in the lower pole left kidney. Stomach/Bowel: Stomach is nondilated. No small bowel or colonic dilatation within the visualized abdomen. Vascular/Lymphatic: No abdominal aortic aneurysm. No abdominal lymphadenopathy. Other:  No intraperitoneal free fluid. Musculoskeletal: Extensive thoracolumbar fusion hardware  generates artifact through the spinal anatomy. IMPRESSION: 1. Dilated gallbladder with intraluminal sludge in tiny dependent gallstones. There is marked gallbladder wall thickening which is ill-defined and diffusely enhancing. Imaging features concerning for acute cholecystitis. 2. No evidence for choledocholithiasis. Common bile  duct in the head of the pancreas measures 7-8 mm diameter. 3. Small gallbladder polyp noted towards the fundus. Electronically Signed   By: Misty Stanley M.D.   On: 10/03/2019 07:26   Dg Chest Portable 1 View  Result Date: 10/01/2019 CLINICAL DATA:  Shortness of breath, history hypertension EXAM: PORTABLE CHEST 1 VIEW COMPARISON:  Portable exam 1936 hours compared to 06/29/2014 FINDINGS: Normal heart size, mediastinal contours, and pulmonary vascularity. Minimal RIGHT basilar atelectasis. Remaining lungs clear. No pleural effusion or pneumothorax. Osseous demineralization with prior thoracic spine fixation. BILATERAL glenohumeral degenerative changes. IMPRESSION: Minimal RIGHT basilar atelectasis. Electronically Signed   By: Lavonia Dana M.D.   On: 10/01/2019 19:53   Mr Abdomen Mrcp Moise Boring Contast  Result Date: 10/03/2019 CLINICAL DATA:  Cholelithiasis. Abnormal liver function tests. Ultrasound findings worrisome for acute cholecystitis. EXAM: MRI ABDOMEN WITHOUT AND WITH CONTRAST (INCLUDING MRCP) TECHNIQUE: Multiplanar multisequence MR imaging of the abdomen was performed both before and after the administration of intravenous contrast. Heavily T2-weighted images of the biliary and pancreatic ducts were obtained, and three-dimensional MRCP images were rendered by post processing. CONTRAST:  61mL GADAVIST GADOBUTROL 1 MMOL/ML IV SOLN COMPARISON:  Ultrasound exam 10/02/2019.  CT scan 10/01/2019. FINDINGS: Lower chest: Unremarkable. Hepatobiliary: Evaluation of liver parenchyma is motion degraded. There is a 6 mm T1 hypointense, T2 hyperintense focus in the posterior right liver (segment VI) seen on image 35 of series 16. This lesion appears to fill in with enhancement on later post-contrast sequences but assessment is limited by the motion artifact. Gallbladder is distended with sludge and tiny dependent stones. There is irregular ill-defined gallbladder wall thickening and enhancement anteriorly in along the  fundus, similar to recent CT scan. Non dependent 3 mm gallbladder polyp identified. No substantial intrahepatic biliary duct dilatation. Common bile duct measures 7-8 mm diameter. No evidence for choledocholithiasis. Pancreas: No focal mass lesion. No dilatation of the main duct. No intraparenchymal cyst. No peripancreatic edema. Spleen:  No splenomegaly. No focal mass lesion. Adrenals/Urinary Tract: No adrenal nodule or mass. Bilateral renal cysts are evident including a 5.4 cm dominant Bosniak 1 cyst in the right kidney and a 8.9 cm dominant Bosniak II cyst in the lower pole left kidney. Stomach/Bowel: Stomach is nondilated. No small bowel or colonic dilatation within the visualized abdomen. Vascular/Lymphatic: No abdominal aortic aneurysm. No abdominal lymphadenopathy. Other:  No intraperitoneal free fluid. Musculoskeletal: Extensive thoracolumbar fusion hardware generates artifact through the spinal anatomy. IMPRESSION: 1. Dilated gallbladder with intraluminal sludge in tiny dependent gallstones. There is marked gallbladder wall thickening which is ill-defined and diffusely enhancing. Imaging features concerning for acute cholecystitis. 2. No evidence for choledocholithiasis. Common bile duct in the head of the pancreas measures 7-8 mm diameter. 3. Small gallbladder polyp noted towards the fundus. Electronically Signed   By: Misty Stanley M.D.   On: 10/03/2019 07:26   Ct Image Guided Drainage By Percutaneous Catheter  Result Date: 10/04/2019 INDICATION: Acute cholecystitis. Patient underwent attempted laparoscopic cholecystectomy however the procedure was aborted secondary to significant inflammation and scarring about the gallbladder fossa. As such, request made for placement of a image guided cholecystostomy tube for infection source purposes. Given recent operative attempt, decision was made to perform the cholecystostomy tube with CT / ultrasound  guidance in lieu of routine fluoroscopic/ultrasound  guidance. EXAM: ULTRASOUND AND CT CHOLECYSTOSTOMY TUBE PLACEMENT COMPARISON:  CT abdomen and pelvis-10/01/2019; abdominal MRI-10/02/2019; abdominal ultrasound-10/02/2019 MEDICATIONS: The patient is currently admitted to the hospital and on intravenous antibiotics. Antibiotics were administered within an appropriate time frame prior to skin puncture. ANESTHESIA/SEDATION: Moderate (conscious) sedation was employed during this procedure. A total of Versed 1.5 mg and Fentanyl 150 mcg was administered intravenously. Moderate Sedation Time: 22 minutes. The patient's level of consciousness and vital signs were monitored continuously by radiology nursing throughout the procedure under my direct supervision. CONTRAST:  None FLUOROSCOPY TIME:  N/A COMPLICATIONS: None immediate. PROCEDURE: Informed written consent was obtained from the patient after a discussion of the risks, benefits and alternatives to treatment. Questions regarding the procedure were encouraged and answered. A timeout was performed prior to the initiation of the procedure. Patient was positioned supine on the fluoroscopy table, slightly LPO. Location of the gallbladder was marked and the gallbladder was identified sonographically. Next, the right upper abdominal quadrant was prepped and draped in the usual sterile fashion, and a sterile drape was applied covering the operative field. A timeout was performed prior to the initiation of the procedure. Local anesthesia was provided with 1% lidocaine with epinephrine. Ultrasound scanning of the right upper quadrant demonstrates a markedly dilated gallbladder. Of note, the patient reported pain with ultrasound imaging over the gallbladder. Utilizing a transhepatic approach, an 18 gauge trocar needle was advanced into the gallbladder and a short Amplatz wire was coiled within the gallbladder lumen. An ultrasound image was saved for documentation purposes. Appropriate positioning was confirmed with limited CT  imaging Next, the track was dilated allowing placement of a 10.2-French Cook cholecystomy tube into the gallbladder fossa, coiled and locked. Appropriate position was confirmed with CT imaging and the catheter was secured to the skin with suture, connected to a drainage bag and a dressing was placed. The patient tolerated the procedure well without immediate post procedural complication. IMPRESSION: Successful ultrasound and CT guided placement of a 10.2 French cholecystostomy tube. Electronically Signed   By: Simonne Come M.D.   On: 10/04/2019 16:59   US Abdomen Limited Ruq  Result Date: 10/02/2019 CLINICAL DATA:  Elevated LFTs. History of hypertension, hyperlipidemia and chronic kidney disease. EXAM: ULTRASOUND ABDOMEN LIMITED RIGHT UPPER QUADRANT COMPARISON:  07/09/2011; CT abdomen and pelvis-10/01/2019 FINDINGS: Gallbladder: There is diffuse thickening of the gallbladder wall (measuring approximately 1.6 cm about the gallbladder fundus - image 10) with small amount of pericholecystic fluid. There are several echogenic gallstones and biliary sludge noted within the gallbladder with dominant stone measuring approximately 0.7 cm in diameter. No definitive sonographic Murphy's sign. Common bile duct: Diameter: Normal in size for age measuring 0.7 cm in diameter. Liver: Mildly coarsened echogenicity of the hepatic parenchyma. No discrete hepatic lesions. No definitive intrahepatic biliary ductal dilatation. No ascites. Portal vein is patent on color Doppler imaging with normal direction of blood flow towards the liver. Other: Incidentally noted approximately 4.8 x 4.4 x 4.4 cm anechoic cyst arising from the superior pole of the right kidney, similar to the 07/2011 examination. IMPRESSION: 1. Cholelithiasis with findings worrisome for acute cholecystitis including marked gallbladder wall thickening and trace amount of pericholecystic fluid, though patient denies pain with sonographic evaluation of the abnormal  appearing gallbladder. If the clinical diagnosis remains uncertain, further evaluation with nuclear medicine HIDA scan could be performed as indicated. 2. Mild, slightly coarsened echogenicity of the hepatic parenchyma, nonspecific though could be seen in the setting  of hepatic steatosis. 3. Incidentally noted approximately 4.8 cm right-sided renal cyst, similar to the 07/2011 examination Electronically Signed   By: Simonne Come M.D.   On: 10/02/2019 08:11    Labs:  CBC: Recent Labs    10/02/19 0625 10/03/19 0319 10/04/19 0201 10/05/19 0215  WBC 7.7 4.9 5.0 5.6  HGB 10.4* 10.9* 10.2* 9.5*  HCT 33.3* 35.0* 32.5* 30.3*  PLT 121* 136* 146* 161    COAGS: Recent Labs    10/04/19 0737  INR 1.1    BMP: Recent Labs    10/02/19 0625 10/03/19 0319 10/04/19 0201 10/05/19 0215  NA 136 139 138 141  K 3.5 3.4* 3.8 3.1*  CL 102 105 105 105  CO2 26 24 24 27   GLUCOSE 134* 89 190* 106*  BUN 8 6* 6* <5*  CALCIUM 8.3* 8.6* 8.5* 8.3*  CREATININE 0.95 0.93 0.84 0.77  GFRNONAA >60 >60 >60 >60  GFRAA >60 >60 >60 >60    LIVER FUNCTION TESTS: Recent Labs    10/02/19 0625 10/03/19 0319 10/04/19 0201 10/05/19 0215  BILITOT 3.0* 1.3* 0.3 0.5  AST 156* 83* 44* 25  ALT 91* 70* 52* 34  ALKPHOS 291* 283* 260* 198*  PROT 5.8* 6.1* 5.8* 5.4*  ALBUMIN 2.5* 2.7* 2.4* 2.4*    Assessment and Plan:  Perc chole drain placed in IR 11/4 Will need to remain 8 weeks We will call pt for OP IR Clinic follow up Please flush drain 10 cc sterile saline daily (may need Rx for flushes) Record OP OP clinic orders in place  Electronically Signed: 13/4, PA-C 10/05/2019, 1:11 PM   I spent a total of 15 Minutes at the the patient's bedside AND on the patient's hospital floor or unit, greater than 50% of which was counseling/coordinating care for perc chole drain

## 2019-10-06 MED ORDER — SODIUM CHLORIDE 0.9% FLUSH: 10.0000 mL | Freq: Every day | INTRAVENOUS | 0 refills | Status: DC

## 2019-10-06 MED ORDER — ONDANSETRON 4 MG PO TBDP: 4.0000 mg | ORAL_TABLET | Freq: Four times a day (QID) | ORAL | 0 refills | Status: DC | PRN

## 2019-10-06 MED ORDER — AMOXICILLIN-POT CLAVULANATE 875-125 MG PO TABS: 1.0000 | ORAL_TABLET | Freq: Two times a day (BID) | ORAL | 0 refills | Status: DC

## 2019-10-06 MED ORDER — SODIUM CHLORIDE FLUSH 0.9 % IV SOLN: 10.0000 mL | Freq: Every day | INTRAVENOUS | 0 refills | Status: AC

## 2019-10-06 MED ORDER — OXYCODONE HCL 10 MG PO TABS: 10.0000 mg | ORAL_TABLET | Freq: Four times a day (QID) | ORAL | 0 refills | Status: DC | PRN

## 2019-10-06 MED ORDER — METHOCARBAMOL 750 MG PO TABS: 750.0000 mg | ORAL_TABLET | Freq: Four times a day (QID) | ORAL | 0 refills | Status: DC | PRN

## 2019-10-06 NOTE — Progress Notes (Signed)
Referring Physician(s): Dr. Donne Hazel  Supervising Physician: Aletta Edouard  Patient Status:  [facility] - In-pt  Chief Complaint: Acute cholecystitis  Subjective: Patient complains of nausea and itching a tube insertion site. States she is planning for discharge home today.  Has several questions about how to make sure she is ok at home.   Allergies: Gatifloxacin and Sulfa antibiotics  Medications: Prior to Admission medications   Medication Sig Start Date End Date Taking? Authorizing Provider  amoxicillin (AMOXIL) 500 MG tablet Take 2,000 mg by mouth See admin instructions. Take four capsules (2000 mg) by mouth one hour prior to any procedure 02/25/18  Yes [provider]  aspirin EC 81 MG tablet Take 81 mg by mouth at bedtime.   Yes [provider]  atenolol (TENORMIN) 50 MG tablet Take 50 mg by mouth daily. 09/18/19  Yes [provider]  Calcium Carbonate-Vitamin D (CALCIUM-D PO) Take 2 tablets by mouth at bedtime.   Yes [provider]  cetirizine (ZYRTEC) 10 MG tablet Take 10 mg by mouth daily.   Yes [provider]  citalopram (CELEXA) 20 MG tablet Take 20 mg by mouth daily. 02/23/18  Yes [provider]  cycloSPORINE (RESTASIS) 0.05 % ophthalmic emulsion Place 1 drop into both eyes 2 (two) times daily.   Yes [provider]  levothyroxine (SYNTHROID, LEVOTHROID) 50 MCG tablet Take 50 mcg by mouth daily. 02/15/18  Yes [provider]  lisinopril (PRINIVIL,ZESTRIL) 5 MG tablet Take 5 mg by mouth daily. 02/09/18  Yes [provider]  Multiple Vitamin (MULTIVITAMIN WITH MINERALS) TABS tablet Take 1 tablet by mouth at bedtime.   Yes [provider]  Multiple Vitamins-Minerals (HAIR/SKIN/NAILS/BIOTIN) TABS Take 1 tablet by mouth 2 (two) times daily.   Yes [provider]  oxybutynin (DITROPAN-XL) 10 MG 24 hr tablet Take 10 mg by mouth daily. 02/13/18  Yes [provider]    oxyCODONE-acetaminophen (PERCOCET) 10-325 MG tablet Take 1 tablet by mouth every 6 (six) hours as needed for pain.  02/16/18  Yes [provider]  pantoprazole (PROTONIX) 40 MG tablet Take 40 mg by mouth 2 (two) times daily. 02/11/18  Yes [provider]  pregabalin (LYRICA) 150 MG capsule Take 150 mg by mouth 2 (two) times daily.    Yes [provider]  rosuvastatin (CRESTOR) 10 MG tablet Take 10 mg by mouth at bedtime.  02/15/18  Yes [provider]  amoxicillin-clavulanate (AUGMENTIN) 875-125 MG tablet Take 1 tablet by mouth every 12 (twelve) hours. 10/06/19   Maczis, Barth Kirks, PA-C  DENTA 5000 PLUS 1.1 % CREA dental cream Place 1 application onto teeth 2 (two) times daily. Do not eat, drink or rinse for 30 minutes after use. 08/12/19   [provider]  methocarbamol (ROBAXIN) 750 MG tablet Take 1 tablet (750 mg total) by mouth every 6 (six) hours as needed for muscle spasms. 10/06/19   Maczis, Barth Kirks, PA-C  ondansetron (ZOFRAN-ODT) 4 MG disintegrating tablet Take 1 tablet (4 mg total) by mouth every 6 (six) hours as needed for nausea. 10/06/19   Maczis, Barth Kirks, PA-C  oxyCODONE 10 MG TABS Take 1 tablet (10 mg total) by mouth every 6 (six) hours as needed for severe pain or breakthrough pain. 10/06/19   Maczis, Barth Kirks, PA-C  sodium chloride flush (NS) 0.9 % SOLN Inject 10 mLs into the vein daily. 10/06/19   Maczis, Barth Kirks, PA-C  sodium chloride flush 0.9 % SOLN injection Inject 10 mLs  into the vein daily. 10/06/19 12/05/19  Maczis, Elmer Sow, PA-C     Vital Signs: BP 135/73    Pulse 61    Temp 98.1 F (36.7 C) (Oral)    Resp 17    Ht  (1.575 m)    Wt 170 lb (77.1 kg)    LMP  (LMP Unknown)    SpO2 98%    BMI 31.09 kg/m   Physical Exam  NAD, alert, lying in bed.  Abdomen: soft, non-tender.  Chole tube in place.  Insertion site intact, clean, and dry.  Bag attached with leg strap.  Bilious output with debris in tubing.   Imaging: Mr 3d Recon  At Scanner  Result Date: 10/03/2019 CLINICAL DATA:  Cholelithiasis. Abnormal liver function tests. Ultrasound findings worrisome for acute cholecystitis. EXAM: MRI ABDOMEN WITHOUT AND WITH CONTRAST (INCLUDING MRCP) TECHNIQUE: Multiplanar multisequence MR imaging of the abdomen was performed both before and after the administration of intravenous contrast. Heavily T2-weighted images of the biliary and pancreatic ducts were obtained, and three-dimensional MRCP images were rendered by post processing. CONTRAST:  7mL GADAVIST GADOBUTROL 1 MMOL/ML IV SOLN COMPARISON:  Ultrasound exam 10/02/2019.  CT scan 10/01/2019. FINDINGS: Lower chest: Unremarkable. Hepatobiliary: Evaluation of liver parenchyma is motion degraded. There is a 6 mm T1 hypointense, T2 hyperintense focus in the posterior right liver (segment VI) seen on image 35 of series 16. This lesion appears to fill in with enhancement on later post-contrast sequences but assessment is limited by the motion artifact. Gallbladder is distended with sludge and tiny dependent stones. There is irregular ill-defined gallbladder wall thickening and enhancement anteriorly in along the fundus, similar to recent CT scan. Non dependent 3 mm gallbladder polyp identified. No substantial intrahepatic biliary duct dilatation. Common bile duct measures 7-8 mm diameter. No evidence for choledocholithiasis. Pancreas: No focal mass lesion. No dilatation of the main duct. No intraparenchymal cyst. No peripancreatic edema. Spleen:  No splenomegaly. No focal mass lesion. Adrenals/Urinary Tract: No adrenal nodule or mass. Bilateral renal cysts are evident including a 5.4 cm dominant Bosniak 1 cyst in the right kidney and a 8.9 cm dominant Bosniak II cyst in the lower pole left kidney. Stomach/Bowel: Stomach is nondilated. No small bowel or colonic dilatation within the visualized abdomen. Vascular/Lymphatic: No abdominal aortic aneurysm. No abdominal lymphadenopathy. Other:  No  intraperitoneal free fluid. Musculoskeletal: Extensive thoracolumbar fusion hardware generates artifact through the spinal anatomy. IMPRESSION: 1. Dilated gallbladder with intraluminal sludge in tiny dependent gallstones. There is marked gallbladder wall thickening which is ill-defined and diffusely enhancing. Imaging features concerning for acute cholecystitis. 2. No evidence for choledocholithiasis. Common bile duct in the head of the pancreas measures 7-8 mm diameter. 3. Small gallbladder polyp noted towards the fundus. Electronically Signed   By: Kennith Center M.D.   On: 10/03/2019 07:26   Mr Abdomen Mrcp Vivien Rossetti Contast  Result Date: 10/03/2019 CLINICAL DATA:  Cholelithiasis. Abnormal liver function tests. Ultrasound findings worrisome for acute cholecystitis. EXAM: MRI ABDOMEN WITHOUT AND WITH CONTRAST (INCLUDING MRCP) TECHNIQUE: Multiplanar multisequence MR imaging of the abdomen was performed both before and after the administration of intravenous contrast. Heavily T2-weighted images of the biliary and pancreatic ducts were obtained, and three-dimensional MRCP images were rendered by post processing. CONTRAST:  7mL GADAVIST GADOBUTROL 1 MMOL/ML IV SOLN COMPARISON:  Ultrasound exam 10/02/2019.  CT scan 10/01/2019. FINDINGS: Lower chest: Unremarkable. Hepatobiliary: Evaluation of liver parenchyma is motion degraded. There is a 6 mm T1 hypointense, T2 hyperintense focus in the  posterior right liver (segment VI) seen on image 35 of series 16. This lesion appears to fill in with enhancement on later post-contrast sequences but assessment is limited by the motion artifact. Gallbladder is distended with sludge and tiny dependent stones. There is irregular ill-defined gallbladder wall thickening and enhancement anteriorly in along the fundus, similar to recent CT scan. Non dependent 3 mm gallbladder polyp identified. No substantial intrahepatic biliary duct dilatation. Common bile duct measures 7-8 mm diameter. No  evidence for choledocholithiasis. Pancreas: No focal mass lesion. No dilatation of the main duct. No intraparenchymal cyst. No peripancreatic edema. Spleen:  No splenomegaly. No focal mass lesion. Adrenals/Urinary Tract: No adrenal nodule or mass. Bilateral renal cysts are evident including a 5.4 cm dominant Bosniak 1 cyst in the right kidney and a 8.9 cm dominant Bosniak II cyst in the lower pole left kidney. Stomach/Bowel: Stomach is nondilated. No small bowel or colonic dilatation within the visualized abdomen. Vascular/Lymphatic: No abdominal aortic aneurysm. No abdominal lymphadenopathy. Other:  No intraperitoneal free fluid. Musculoskeletal: Extensive thoracolumbar fusion hardware generates artifact through the spinal anatomy. IMPRESSION: 1. Dilated gallbladder with intraluminal sludge in tiny dependent gallstones. There is marked gallbladder wall thickening which is ill-defined and diffusely enhancing. Imaging features concerning for acute cholecystitis. 2. No evidence for choledocholithiasis. Common bile duct in the head of the pancreas measures 7-8 mm diameter. 3. Small gallbladder polyp noted towards the fundus. Electronically Signed   By: Kennith Center M.D.   On: 10/03/2019 07:26   Ct Image Guided Drainage By Percutaneous Catheter  Result Date: 10/04/2019 INDICATION: Acute cholecystitis. Patient underwent attempted laparoscopic cholecystectomy however the procedure was aborted secondary to significant inflammation and scarring about the gallbladder fossa. As such, request made for placement of a image guided cholecystostomy tube for infection source purposes. Given recent operative attempt, decision was made to perform the cholecystostomy tube with CT / ultrasound guidance in lieu of routine fluoroscopic/ultrasound guidance. EXAM: ULTRASOUND AND CT CHOLECYSTOSTOMY TUBE PLACEMENT COMPARISON:  CT abdomen and pelvis-10/01/2019; abdominal MRI-10/02/2019; abdominal ultrasound-10/02/2019 MEDICATIONS: The  patient is currently admitted to the hospital and on intravenous antibiotics. Antibiotics were administered within an appropriate time frame prior to skin puncture. ANESTHESIA/SEDATION: Moderate (conscious) sedation was employed during this procedure. A total of Versed 1.5 mg and Fentanyl 150 mcg was administered intravenously. Moderate Sedation Time: 22 minutes. The patient's level of consciousness and vital signs were monitored continuously by radiology nursing throughout the procedure under my direct supervision. CONTRAST:  None FLUOROSCOPY TIME:  N/A COMPLICATIONS: None immediate. PROCEDURE: Informed written consent was obtained from the patient after a discussion of the risks, benefits and alternatives to treatment. Questions regarding the procedure were encouraged and answered. A timeout was performed prior to the initiation of the procedure. Patient was positioned supine on the fluoroscopy table, slightly LPO. Location of the gallbladder was marked and the gallbladder was identified sonographically. Next, the right upper abdominal quadrant was prepped and draped in the usual sterile fashion, and a sterile drape was applied covering the operative field. A timeout was performed prior to the initiation of the procedure. Local anesthesia was provided with 1% lidocaine with epinephrine. Ultrasound scanning of the right upper quadrant demonstrates a markedly dilated gallbladder. Of note, the patient reported pain with ultrasound imaging over the gallbladder. Utilizing a transhepatic approach, an 18 gauge trocar needle was advanced into the gallbladder and a short Amplatz wire was coiled within the gallbladder lumen. An ultrasound image was saved for documentation purposes. Appropriate positioning was confirmed with  limited CT imaging Next, the track was dilated allowing placement of a 10.2-French Cook cholecystomy tube into the gallbladder fossa, coiled and locked. Appropriate position was confirmed with CT imaging  and the catheter was secured to the skin with suture, connected to a drainage bag and a dressing was placed. The patient tolerated the procedure well without immediate post procedural complication. IMPRESSION: Successful ultrasound and CT guided placement of a 10.2 French cholecystostomy tube. Electronically Signed   By: Simonne ComeJohn  Watts M.D.   On: 10/04/2019 16:59    Labs:  CBC: Recent Labs    10/02/19 0625 10/03/19 0319 10/04/19 0201 10/05/19 0215  WBC 7.7 4.9 5.0 5.6  HGB 10.4* 10.9* 10.2* 9.5*  HCT 33.3* 35.0* 32.5* 30.3*  PLT 121* 136* 146* 161    COAGS: Recent Labs    10/04/19 0737  INR 1.1    BMP: Recent Labs    10/02/19 0625 10/03/19 0319 10/04/19 0201 10/05/19 0215  NA 136 139 138 141  K 3.5 3.4* 3.8 3.1*  CL 102 105 105 105  CO2 26 24 24 27   GLUCOSE 134* 89 190* 106*  BUN 8 6* 6* <5*  CALCIUM 8.3* 8.6* 8.5* 8.3*  CREATININE 0.95 0.93 0.84 0.77  GFRNONAA >60 >60 >60 >60  GFRAA >60 >60 >60 >60    LIVER FUNCTION TESTS: Recent Labs    10/02/19 0625 10/03/19 0319 10/04/19 0201 10/05/19 0215  BILITOT 3.0* 1.3* 0.3 0.5  AST 156* 83* 44* 25  ALT 91* 70* 52* 34  ALKPHOS 291* 283* 260* 198*  PROT 5.8* 6.1* 5.8* 5.4*  ALBUMIN 2.5* 2.7* 2.4* 2.4*    Assessment and Plan: Acute cholecystitis s/p percutaneous cholecystostomy tube placement 10/04/19 Patient complaining of nausea. RN made aware.  Chole tube is draining.  Had 280 mL out yesterday.  States she is planning for discharge today.  Outpatient orders are already in place.   All questions answered.  Electronically Signed: Hoyt KochKacie Sue-Ellen Dyesha Henault, PA 10/06/2019, 4:11 PM   I spent a total of 15 Minutes at the the patient's bedside AND on the patient's hospital floor or unit, greater than 50% of which was counseling/coordinating care for acute cholecystitis.

## 2019-10-06 NOTE — Progress Notes (Signed)
Suzanne Harrison to be D/C'd  per MD order. Discussed with the patient and all questions fully answered.  VSS, Skin clean, dry and intact without evidence of skin break down, no evidence of skin tears noted.  IV catheter discontinued intact. Site without signs and symptoms of complications. Dressing and pressure applied.  An After Visit Summary was printed and given to the patient. Patient received prescription.  D/c education completed with patient/family including follow up instructions, medication list, d/c activities limitations if indicated, with other d/c instructions as indicated by MD - patient able to verbalize understanding, all questions fully answered.   Patient instructed to return to ED, call 911, or call MD for any changes in condition.   Patient to be escorted via [place], and D/C home via private auto.

## 2019-10-06 NOTE — Progress Notes (Signed)
Reviewed drain flush with husband

## 2019-10-06 NOTE — Discharge Summary (Signed)
Patient ID: Suzanne Harrison [id number] July 29, 1951 68 y.o.  Admit date: 10/01/2019 Discharge date: 10/06/2019  Admitting Diagnosis: Acute Cholecystitis   Discharge Diagnosis Patient Active Problem List   Diagnosis Date Noted  . Acute cholecystitis 10/01/2019  . Referred ear pain 05/27/2016  . Hip arthritis 03/02/2013  . 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 Dr. Dwain Sarna - attempted laparoscopic cholecystectomy, drain placement -10/03/2019  2. Dr. Grace Isaac - Percutaneous Cholecystostomy Tube Placement - 10/04/2019  Hospital Course:  68 yo female who presented with 2 days of constant epigastric pain with associated nausea and fever. Workup in the ED was consistent with acute cholecystitis. Patient was taken to the OR on 11/3 for an attempted laparoscopic cholecystectomy. Unfortunately, it was a difficult case. Dr. Dwain Sarna was able to dissect down several cm and it appeared that the stomach and duodenum were adherent to the galbladder and were not going to come down easily.  He felt there was not a way to do a subtotal or get down to find the structures in the triangle without a major operation or injury. He also felt that putting a drain in by me would not be a good idea as opposed to an IR drain that traverses the liver. The patient was taken to IR and had a perc chole drain place on 11/4. She tolerated this procedure well. Her diet was advanced as tolerated. He WBC count and LFT's normalized. Her hospital course was complicated by pain control as she takes Percocet, along with Lyrica and Celexa for chronic back pain at home. On POD 3, 10/06/2019, the patient was voiding well, tolerating diet, ambulating well, pain well controlled, vital signs stable, incisions c/d/i and felt stable for discharge home. Her surgical JP drain was removed prior to d/c . Her Perc Chole drain remained in place. Follow up as noted below. We  discussed pain medication at home. She reports she does not have a pain contract. I supplied a short course of pain medication at the time of discharge.   Physical Exam: Gen: Alert, NAD, pleasant Card: RRR Pulm: CTAB, no W/R/R, effort normal Abd: Soft, ND,appropriately tender.+BS. Laparoscopic incisions with dermabond and steri-strips in place. Redness around laparoscopic sites has resolved. Prior JP drain site clean and dry.Perc Chole drain with bile in bag Ext: No erythema, edema, or tenderness BUE/BLE  Psych: A&Ox3  Skin: no rashes noted, warm and dry  Allergies as of 10/06/2019      Reactions   Gatifloxacin Rash, Other (See Comments)   Other reaction(s): Myalgias (Muscle Pain) Rash in mouth   Sulfa Antibiotics Nausea Only      Medication List    STOP taking these medications   amoxicillin 500 MG tablet Commonly known as: AMOXIL   oxyCODONE-acetaminophen 10-325 MG tablet Commonly known as: PERCOCET     TAKE these medications   amoxicillin-clavulanate 875-125 MG tablet Commonly known as: AUGMENTIN Take 1 tablet by mouth every 12 (twelve) hours.   aspirin EC 81 MG tablet Take 81 mg by mouth at bedtime.   atenolol 50 MG tablet Commonly known as: TENORMIN Take 50 mg by mouth daily.   CALCIUM-D PO Take 2 tablets by mouth at bedtime.   cetirizine 10 MG tablet Commonly known as: ZYRTEC Take 10 mg by mouth daily.   citalopram 20 MG tablet Commonly known as: CELEXA Take 20 mg by mouth daily.   cycloSPORINE 0.05 % ophthalmic emulsion Commonly known as: RESTASIS Place  1 drop into both eyes 2 (two) times daily.   Denta 5000 Plus 1.1 % Crea dental cream Generic drug: sodium fluoride Place 1 application onto teeth 2 (two) times daily. Do not eat, drink or rinse for 30 minutes after use.   Hair/Skin/Nails/Biotin Tabs Take 1 tablet by mouth 2 (two) times daily.   levothyroxine 50 MCG tablet Commonly known as: SYNTHROID Take 50 mcg by mouth daily.    lisinopril 5 MG tablet Commonly known as: ZESTRIL Take 5 mg by mouth daily.   Lyrica 150 MG capsule Generic drug: pregabalin Take 150 mg by mouth 2 (two) times daily.   methocarbamol 750 MG tablet Commonly known as: ROBAXIN Take 1 tablet (750 mg total) by mouth every 6 (six) hours as needed for muscle spasms.   multivitamin with minerals Tabs tablet Take 1 tablet by mouth at bedtime.   ondansetron 4 MG disintegrating tablet Commonly known as: ZOFRAN-ODT Take 1 tablet (4 mg total) by mouth every 6 (six) hours as needed for nausea.   oxybutynin 10 MG 24 hr tablet Commonly known as: DITROPAN-XL Take 10 mg by mouth daily.   Oxycodone HCl 10 MG Tabs Take 1 tablet (10 mg total) by mouth every 6 (six) hours as needed for severe pain or breakthrough pain.   pantoprazole 40 MG tablet Commonly known as: PROTONIX Take 40 mg by mouth 2 (two) times daily.   rosuvastatin 10 MG tablet Commonly known as: CRESTOR Take 10 mg by mouth at bedtime.   sodium chloride flush 0.9 % Soln Commonly known as: NS Inject 10 mLs into the vein daily.   sodium chloride flush 0.9 % Soln injection Inject 10 mLs into the vein daily.        Follow-up Information    Sandi Mariscal, MD Follow up in 8 week(s).   Specialties: Interventional Radiology, Radiology Why: pt will hear from scheduler for 8 week follow up appt; call [phone] if questions; please flush drain daily 10 cc sterile saline and record output daily Contact information: [address] [place] [address] [phone]        Rolm Bookbinder, MD. Call.   Specialty: General Surgery Why: Call to arrange follow up with Dr. Donne Hazel after your appointment with interventional radiology Contact information: [place] [address] [phone]           Signed: Alferd Apa, [facility] 10/06/2019, 4:57 PM Please see Amion for pager number during day hours  7:00am-4:30pm

## 2019-10-09 ENCOUNTER — Other Ambulatory Visit: Payer: Self-pay | Admitting: General Surgery

## 2019-10-09 DIAGNOSIS — K8020 Calculus of gallbladder without cholecystitis without obstruction: Secondary | ICD-10-CM

## 2019-11-16 ENCOUNTER — Encounter: Payer: Self-pay | Admitting: Radiology

## 2019-11-16 ENCOUNTER — Ambulatory Visit
Admission: RE | Admit: 2019-11-16 | Discharge: 2019-11-16 | Disposition: A | Discharge status: Home / Self Care | Payer: 59 | Source: Ambulatory Visit | Attending: General Surgery | Admitting: General Surgery

## 2019-11-16 ENCOUNTER — Ambulatory Visit
Admission: RE | Admit: 2019-11-16 | Discharge: 2019-11-16 | Disposition: A | Discharge status: Home / Self Care | Payer: 59 | Source: Ambulatory Visit | Attending: Radiology | Admitting: Radiology

## 2019-11-16 DIAGNOSIS — K8020 Calculus of gallbladder without cholecystitis without obstruction: Secondary | ICD-10-CM

## 2019-11-16 HISTORY — PX: IR RADIOLOGIST EVAL & MGMT: IMG5224

## 2019-11-16 NOTE — Progress Notes (Signed)
Chief Complaint: Follow up Chole drain  Referring Physician(s): Dwain Sarna  Supervising Physician: Malachy Moan  History of Present Illness: Suzanne Harrison is a 68 y.o. female Acute cholecystitis. Patient underwent attempted laparoscopic cholecystectomy however the procedure was aborted secondary to ignificant inflammation and scarring about the gallbladder fossa.  She had a percutaneous cholecystostomy drain placed by Dr. Grace Isaac on 10/04/2019.  She is here today for injection.  She has kept very good records of output. She has anywhere from 125 to 200 mL output daily.  She denies fever/chills, N/V, or RUQ pain.  Her only complaint is "lugging around that bag... it's so cumbersome, and gets in the way".  Past Medical History:  Diagnosis Date  . CKD (chronic kidney disease)   . Gallstones 10/2019  . Hypercholesteremia   . Hypertension     Past Surgical History:  Procedure Laterality Date  . BACK SURGERY    . BILIARY DRAINAGE  10/03/2019   ATTEMPTED LAPAROSCOPIC CHOLECYSTECTOMY WITH PLACEMENT OF DRAIN (N/A Abdomen)  . CHOLECYSTECTOMY N/A 10/03/2019   Procedure: ATTEMPTED LAPAROSCOPIC CHOLECYSTECTOMY WITH PLACEMENT OF DRAIN;  Surgeon: Emelia Loron, MD;  Location: [facility] OR;  Service: General;  Laterality: N/A;  . REPLACEMENT TOTAL HIP W/  RESURFACING IMPLANTS      Allergies: Gatifloxacin and Sulfa antibiotics  Medications: Prior to Admission medications   Medication Sig Start Date End Date Taking? Authorizing Provider  amoxicillin-clavulanate (AUGMENTIN) 875-125 MG tablet Take 1 tablet by mouth every 12 (twelve) hours. 10/06/19   Maczis, Elmer Sow, PA-C  aspirin EC 81 MG tablet Take 81 mg by mouth at bedtime.    [provider]  atenolol (TENORMIN) 50 MG tablet Take 50 mg by mouth daily. 09/18/19   [provider]  Calcium Carbonate-Vitamin D (CALCIUM-D PO) Take 2 tablets by mouth at bedtime.    [provider]  cetirizine  (ZYRTEC) 10 MG tablet Take 10 mg by mouth daily.    [provider]  citalopram (CELEXA) 20 MG tablet Take 20 mg by mouth daily. 02/23/18   [provider]  cycloSPORINE (RESTASIS) 0.05 % ophthalmic emulsion Place 1 drop into both eyes 2 (two) times daily.    [provider]  DENTA 5000 PLUS 1.1 % CREA dental cream Place 1 application onto teeth 2 (two) times daily. Do not eat, drink or rinse for 30 minutes after use. 08/12/19   [provider]  levothyroxine (SYNTHROID, LEVOTHROID) 50 MCG tablet Take 50 mcg by mouth daily. 02/15/18   [provider]  lisinopril (PRINIVIL,ZESTRIL) 5 MG tablet Take 5 mg by mouth daily. 02/09/18   [provider]  methocarbamol (ROBAXIN) 750 MG tablet Take 1 tablet (750 mg total) by mouth every 6 (six) hours as needed for muscle spasms. 10/06/19   Maczis, Elmer Sow, PA-C  Multiple Vitamin (MULTIVITAMIN WITH MINERALS) TABS tablet Take 1 tablet by mouth at bedtime.    [provider]  Multiple Vitamins-Minerals (HAIR/SKIN/NAILS/BIOTIN) TABS Take 1 tablet by mouth 2 (two) times daily.    [provider]  ondansetron (ZOFRAN-ODT) 4 MG disintegrating tablet Take 1 tablet (4 mg total) by mouth every 6 (six) hours as needed for nausea. 10/06/19   Maczis, Elmer Sow, PA-C  oxybutynin (DITROPAN-XL) 10 MG 24 hr tablet Take 10 mg by mouth daily. 02/13/18   [provider]  oxyCODONE 10 MG TABS Take 1 tablet (10 mg total) by mouth every 6 (six) hours as needed for severe pain or breakthrough pain. 10/06/19  Maczis, Elmer SowMichael M, PA-C  pantoprazole (PROTONIX) 40 MG tablet Take 40 mg by mouth 2 (two) times daily. 02/11/18   [provider]  pregabalin (LYRICA) 150 MG capsule Take 150 mg by mouth 2 (two) times daily.     [provider]  rosuvastatin (CRESTOR) 10 MG tablet Take 10 mg by mouth at bedtime.  02/15/18   [provider]  sodium chloride flush (NS) 0.9 % SOLN Inject 10 mLs into  the vein daily. 10/06/19   Maczis, Elmer SowMichael M, PA-C  sodium chloride flush 0.9 % SOLN injection Inject 10 mLs into the vein daily. 10/06/19 12/05/19  Maczis, Elmer SowMichael M, PA-C     No family history on file.  Social History   Socioeconomic History  . Marital status: Married    Spouse name: Not on file  . Number of children: Not on file  . Years of education: Not on file  . Highest education level: Not on file  Occupational History  . Not on file  Tobacco Use  . Smoking status: Never Smoker  . Smokeless tobacco: Never Used  Substance and Sexual Activity  . Alcohol use: No  . Drug use: No  . Sexual activity: Not on file  Other Topics Concern  . Not on file  Social History Narrative  . Not on file   Social Determinants of Health   Financial Resource Strain:   . Difficulty of Paying Living Expenses: Not on file  Food Insecurity:   . Worried About Programme researcher, broadcasting/film/videounning Out of Food in the Last Year: Not on file  . Ran Out of Food in the Last Year: Not on file  Transportation Needs:   . Lack of Transportation (Medical): Not on file  . Lack of Transportation (Non-Medical): Not on file  Physical Activity:   . Days of Exercise per Week: Not on file  . Minutes of Exercise per Session: Not on file  Stress:   . Feeling of Stress : Not on file  Social Connections:   . Frequency of Communication with Friends and Family: Not on file  . Frequency of Social Gatherings with Friends and Family: Not on file  . Attends Religious Services: Not on file  . Active Member of Clubs or Organizations: Not on file  . Attends BankerClub or Organization Meetings: Not on file  . Marital Status: Not on file     Review of Systems: A 12 point ROS discussed and pertinent positives are indicated in the HPI above.  All other systems are negative.  Review of Systems  Vital Signs: LMP  (LMP Unknown)   Physical Exam Vitals reviewed.  Constitutional:      Appearance: Normal appearance.  Pulmonary:     Effort: Pulmonary  effort is normal. No respiratory distress.  Abdominal:     General: There is no distension.     Palpations: Abdomen is soft.     Tenderness: There is no abdominal tenderness.     Comments: Drain in place RUQ. About 150 mL bile in bag, not purulent. Stat-lock is loose, about to fall off.  Drain injection showed patency of the cystic duct. Common bile duct narrows but there is a trickle of contrast that reaches the duodenum. (30 mL contrast used)  Neurological:     General: No focal deficit present.     Mental Status: She is alert and oriented to person, place, and time.  Psychiatric:        Mood and Affect: Mood normal.  Behavior: Behavior normal.        Thought Content: Thought content normal.        Judgment: Judgment normal.       Imaging: No results found.  Labs:  CBC: Recent Labs    10/02/19 0625 10/03/19 0319 10/04/19 0201 10/05/19 0215  WBC 7.7 4.9 5.0 5.6  HGB 10.4* 10.9* 10.2* 9.5*  HCT 33.3* 35.0* 32.5* 30.3*  PLT 121* 136* 146* 161    COAGS: Recent Labs    10/04/19 0737  INR 1.1    BMP: Recent Labs    10/02/19 0625 10/03/19 0319 10/04/19 0201 10/05/19 0215  NA 136 139 138 141  K 3.5 3.4* 3.8 3.1*  CL 102 105 105 105  CO2 26 24 24 27   GLUCOSE 134* 89 190* 106*  BUN 8 6* 6* <5*  CALCIUM 8.3* 8.6* 8.5* 8.3*  CREATININE 0.95 0.93 0.84 0.77  GFRNONAA >60 >60 >60 >60  GFRAA >60 >60 >60 >60    LIVER FUNCTION TESTS: Recent Labs    10/02/19 0625 10/03/19 0319 10/04/19 0201 10/05/19 0215  BILITOT 3.0* 1.3* 0.3 0.5  AST 156* 83* 44* 25  ALT 91* 70* 52* 34  ALKPHOS 291* 283* 260* 198*  PROT 5.8* 6.1* 5.8* 5.4*  ALBUMIN 2.5* 2.7* 2.4* 2.4*    TUMOR MARKERS: No results for input(s): AFPTM, CEA, CA199, CHROMGRNA in the last 8760 hours.  Assessment/Plan:  Recent acute cholecystitis.   S/P attempted laparoscopic cholecystectomy = procedure was aborted secondary to significant inflammation and scarring about the gallbladder  fossa.  Percutaneous cholecystostomy drain placed by Dr. Pascal Lux on 10/04/2019.  Trickle of contrast enters the duodenum on today's drain injection.  Images and patient seen by Dr. Laurence Ferrari as well.  Drain capped today at patient's request.   The plan is for Laparoscopic Cholecystectomy in a few weeks (holding off now due to COVID spike and limited hospital beds).  Dr. Laurence Ferrari and I both explained to her if she develops any symptoms, N/V, F/C, RUQ pain, she is to imediatley re-attach a gravity bag to the drain. She understands any prolonged inflammation could delay her procedure.   I instructed her how to re-attach the bag and she understands it is just like flushing it.  If her Cholecystectomy has not been performed within the next 4 weeks, she will need to be scheduled for a drain exchange. I have sent a message to Dr. Donne Hazel and he is aware.  Electronically Signed: Murrell Redden PA-C 11/16/2019, 2:03 PM    Please refer to Dr. Katrinka Blazing attestation of this note for management and plan.

## 2019-11-21 ENCOUNTER — Other Ambulatory Visit (HOSPITAL_COMMUNITY): Payer: Self-pay | Admitting: Interventional Radiology

## 2019-11-29 ENCOUNTER — Other Ambulatory Visit: Payer: Self-pay | Admitting: General Surgery

## 2019-11-29 DIAGNOSIS — K819 Cholecystitis, unspecified: Secondary | ICD-10-CM

## 2019-11-30 ENCOUNTER — Other Ambulatory Visit: Payer: Self-pay | Admitting: General Surgery

## 2019-12-01 DIAGNOSIS — J90 Pleural effusion, not elsewhere classified: Secondary | ICD-10-CM

## 2019-12-01 HISTORY — DX: Pleural effusion, not elsewhere classified: J90

## 2019-12-06 ENCOUNTER — Other Ambulatory Visit: Payer: 59

## 2019-12-13 ENCOUNTER — Ambulatory Visit
Admission: RE | Admit: 2019-12-13 | Discharge: 2019-12-13 | Disposition: A | Discharge status: Home / Self Care | Payer: 59 | Source: Ambulatory Visit | Attending: General Surgery | Admitting: General Surgery

## 2019-12-13 ENCOUNTER — Encounter: Payer: Self-pay | Admitting: *Deleted

## 2019-12-13 DIAGNOSIS — K819 Cholecystitis, unspecified: Secondary | ICD-10-CM

## 2019-12-13 HISTORY — PX: IR RADIOLOGIST EVAL & MGMT: IMG5224

## 2019-12-13 NOTE — Progress Notes (Addendum)
Referring Physician(s): [facility]  Chief Complaint: The patient is seen in follow up today s/p percutaneous cholecystostomy on 10/04/2019  History of present illness: [name] is a 69 year old female with history of acute cholecystitis and prior attempted laparoscopic cholecystectomy which was aborted secondary to significant inflammation and scarring about the gallbladder fossa.  She underwent percutaneous cholecystostomy on 10/04/2019.  Her most recent cholangiogram on 11/16/2019 revealed patency of the cystic duct with no evidence of choledocholithiasis.  Her cholecystostomy drain has been capped since that time.  She presents today in follow-up for gallbladder drain removal.  According to the patient she has been stable since that time.  She reports no increasing abdominal pain, fever, nausea or vomiting.   Past Medical History:  Diagnosis Date  . CKD (chronic kidney disease)   . Gallstones 10/2019  . Hypercholesteremia   . Hypertension     Past Surgical History:  Procedure Laterality Date  . BACK SURGERY    . BILIARY DRAINAGE  10/03/2019   ATTEMPTED LAPAROSCOPIC CHOLECYSTECTOMY WITH PLACEMENT OF DRAIN (N/A Abdomen)  . CHOLECYSTECTOMY N/A 10/03/2019   Procedure: ATTEMPTED LAPAROSCOPIC CHOLECYSTECTOMY WITH PLACEMENT OF DRAIN;  Surgeon: Emelia Loron, MD;  Location: [facility] OR;  Service: General;  Laterality: N/A;  . IR RADIOLOGIST EVAL & MGMT  11/16/2019  . REPLACEMENT TOTAL HIP W/  RESURFACING IMPLANTS      Allergies: Gatifloxacin and Sulfa antibiotics  Medications: Prior to Admission medications   Medication Sig Start Date End Date Taking? Authorizing Provider  amoxicillin-clavulanate (AUGMENTIN) 875-125 MG tablet Take 1 tablet by mouth every 12 (twelve) hours. 10/06/19   Maczis, Elmer Sow, PA-C  aspirin EC 81 MG tablet Take 81 mg by mouth at bedtime.    [provider]  atenolol (TENORMIN) 50 MG tablet Take 50 mg by mouth daily. 09/18/19   [provider]  Calcium Carbonate-Vitamin D (CALCIUM-D PO) Take 2 tablets by mouth at bedtime.    [provider]  cetirizine (ZYRTEC) 10 MG tablet Take 10 mg by mouth daily.    [provider]  citalopram (CELEXA) 20 MG tablet Take 20 mg by mouth daily. 02/23/18   [provider]  cycloSPORINE (RESTASIS) 0.05 % ophthalmic emulsion Place 1 drop into both eyes 2 (two) times daily.    [provider]  DENTA 5000 PLUS 1.1 % CREA dental cream Place 1 application onto teeth 2 (two) times daily. Do not eat, drink or rinse for 30 minutes after use. 08/12/19   [provider]  levothyroxine (SYNTHROID, LEVOTHROID) 50 MCG tablet Take 50 mcg by mouth daily. 02/15/18   [provider]  lisinopril (PRINIVIL,ZESTRIL) 5 MG tablet Take 5 mg by mouth daily. 02/09/18   [provider]  methocarbamol (ROBAXIN) 750 MG tablet Take 1 tablet (750 mg total) by mouth every 6 (six) hours as needed for muscle spasms. 10/06/19   Maczis, Elmer Sow, PA-C  Multiple Vitamin (MULTIVITAMIN WITH MINERALS) TABS tablet Take 1 tablet by mouth at bedtime.    [provider]  Multiple Vitamins-Minerals (HAIR/SKIN/NAILS/BIOTIN) TABS Take 1 tablet by mouth 2 (two) times daily.    [provider]  ondansetron (ZOFRAN-ODT) 4 MG disintegrating tablet Take 1 tablet (4 mg total) by mouth every 6 (six) hours as needed for nausea. 10/06/19   Maczis, Elmer Sow, PA-C  oxybutynin (DITROPAN-XL) 10 MG 24 hr tablet Take 10 mg by mouth daily. 02/13/18   [provider]  oxyCODONE 10 MG TABS Take 1 tablet (10 mg total) by  mouth every 6 (six) hours as needed for severe pain or breakthrough pain. 10/06/19   Maczis, Elmer Sow, PA-C  pantoprazole (PROTONIX) 40 MG tablet Take 40 mg by mouth 2 (two) times daily. 02/11/18   [provider]  pregabalin (LYRICA) 150 MG capsule Take 150 mg by mouth 2 (two) times daily.     [provider]  rosuvastatin (CRESTOR) 10 MG  tablet Take 10 mg by mouth at bedtime.  02/15/18   [provider]  sodium chloride flush (NS) 0.9 % SOLN Inject 10 mLs into the vein daily. 10/06/19   Maczis, Elmer Sow, PA-C     No family history on file.  Social History   Socioeconomic History  . Marital status: Married    Spouse name: Not on file  . Number of children: Not on file  . Years of education: Not on file  . Highest education level: Not on file  Occupational History  . Not on file  Tobacco Use  . Smoking status: Never Smoker  . Smokeless tobacco: Never Used  Substance and Sexual Activity  . Alcohol use: No  . Drug use: No  . Sexual activity: Not on file  Other Topics Concern  . Not on file  Social History Narrative  . Not on file   Social Determinants of Health   Financial Resource Strain:   . Difficulty of Paying Living Expenses: Not on file  Food Insecurity:   . Worried About Programme researcher, broadcasting/film/video in the Last Year: Not on file  . Ran Out of Food in the Last Year: Not on file  Transportation Needs:   . Lack of Transportation (Medical): Not on file  . Lack of Transportation (Non-Medical): Not on file  Physical Activity:   . Days of Exercise per Week: Not on file  . Minutes of Exercise per Session: Not on file  Stress:   . Feeling of Stress : Not on file  Social Connections:   . Frequency of Communication with Friends and Family: Not on file  . Frequency of Social Gatherings with Friends and Family: Not on file  . Attends Religious Services: Not on file  . Active Member of Clubs or Organizations: Not on file  . Attends Banker Meetings: Not on file  . Marital Status: Not on file     Vital Harrison: LMP  (LMP Unknown)   Physical Exam awake, alert.  Gallbladder drain intact, and capped; insertion site okay, not significantly tender.  Abdomen soft  Imaging: No results found.  Labs:  CBC: Recent Labs    10/02/19 0625 10/03/19 0319 10/04/19 0201 10/05/19 0215  WBC 7.7 4.9  5.0 5.6  HGB 10.4* 10.9* 10.2* 9.5*  HCT 33.3* 35.0* 32.5* 30.3*  PLT 121* 136* 146* 161    COAGS: Recent Labs    10/04/19 0737  INR 1.1    BMP: Recent Labs    10/02/19 0625 10/03/19 0319 10/04/19 0201 10/05/19 0215  NA 136 139 138 141  K 3.5 3.4* 3.8 3.1*  CL 102 105 105 105  CO2 26 24 24 27   GLUCOSE 134* 89 190* 106*  BUN 8 6* 6* <5*  CALCIUM 8.3* 8.6* 8.5* 8.3*  CREATININE 0.95 0.93 0.84 0.77  GFRNONAA >60 >60 >60 >60  GFRAA >60 >60 >60 >60    LIVER FUNCTION TESTS: Recent Labs    10/02/19 0625 10/03/19 0319 10/04/19 0201 10/05/19 0215  BILITOT 3.0* 1.3* 0.3 0.5  AST 156* 83* 44* 25  ALT 91* 70* 52* 34  ALKPHOS 291* 283* 260* 198*  PROT 5.8* 6.1* 5.8* 5.4*  ALBUMIN 2.5* 2.7* 2.4* 2.4*    Assessment: Patient status post percutaneous cholecystostomy on 10/04/2019 secondary to cholecystitis and aborted attempt at laparoscopic cholecystectomy secondary to significant inflammation and scarring about the gallbladder fossa.  Most recent cholangiogram performed on 11/16/2019 yielded patent cystic duct.  Drain has been capped since that time.  Patient presents today for gallbladder drain removal per surgeon order.  Patient currently stable.  Gallbladder drain removed in its entirety without immediate complications.  Gauze dressing applied over site.  Site care reviewed with patient.   Signed: D. Rowe Robert, PA-C 12/13/2019, 9:27 AM   Please refer to Dr. Fritz Pickerel attestation of this note for management and plan.      Patient ID: Suzanne Harrison, female   DOB: 1951-02-19, 69 y.o.   MRN: [id number]

## 2019-12-16 ENCOUNTER — Emergency Department (HOSPITAL_COMMUNITY): Payer: 59

## 2019-12-16 ENCOUNTER — Encounter (HOSPITAL_COMMUNITY): Payer: Self-pay | Admitting: Emergency Medicine

## 2019-12-16 ENCOUNTER — Inpatient Hospital Stay (HOSPITAL_COMMUNITY)
Admission: EM | Admit: 2019-12-16 | Discharge: 2019-12-25 | DRG: 194 | Disposition: A | Discharge status: Home / Self Care | Payer: 59 | Attending: Internal Medicine | Admitting: Internal Medicine

## 2019-12-16 ENCOUNTER — Ambulatory Visit (HOSPITAL_COMMUNITY)
Admission: EM | Admit: 2019-12-16 | Discharge: 2019-12-16 | Disposition: A | Discharge status: Home / Self Care | Payer: 59 | Source: Home / Self Care

## 2019-12-16 ENCOUNTER — Other Ambulatory Visit: Payer: Self-pay

## 2019-12-16 DIAGNOSIS — Z96643 Presence of artificial hip joint, bilateral: Secondary | ICD-10-CM | POA: Diagnosis present

## 2019-12-16 DIAGNOSIS — J869 Pyothorax without fistula: Secondary | ICD-10-CM | POA: Diagnosis not present

## 2019-12-16 DIAGNOSIS — J181 Lobar pneumonia, unspecified organism: Secondary | ICD-10-CM | POA: Diagnosis present

## 2019-12-16 DIAGNOSIS — E039 Hypothyroidism, unspecified: Secondary | ICD-10-CM | POA: Diagnosis present

## 2019-12-16 DIAGNOSIS — Z20822 Contact with and (suspected) exposure to covid-19: Secondary | ICD-10-CM | POA: Diagnosis present

## 2019-12-16 DIAGNOSIS — R0602 Shortness of breath: Secondary | ICD-10-CM

## 2019-12-16 DIAGNOSIS — I959 Hypotension, unspecified: Secondary | ICD-10-CM | POA: Diagnosis not present

## 2019-12-16 DIAGNOSIS — I10 Essential (primary) hypertension: Secondary | ICD-10-CM

## 2019-12-16 DIAGNOSIS — Z9889 Other specified postprocedural states: Secondary | ICD-10-CM

## 2019-12-16 DIAGNOSIS — Z882 Allergy status to sulfonamides status: Secondary | ICD-10-CM

## 2019-12-16 DIAGNOSIS — J90 Pleural effusion, not elsewhere classified: Secondary | ICD-10-CM | POA: Diagnosis not present

## 2019-12-16 DIAGNOSIS — N181 Chronic kidney disease, stage 1: Secondary | ICD-10-CM | POA: Diagnosis present

## 2019-12-16 DIAGNOSIS — D649 Anemia, unspecified: Secondary | ICD-10-CM

## 2019-12-16 DIAGNOSIS — E785 Hyperlipidemia, unspecified: Secondary | ICD-10-CM | POA: Diagnosis present

## 2019-12-16 DIAGNOSIS — I129 Hypertensive chronic kidney disease with stage 1 through stage 4 chronic kidney disease, or unspecified chronic kidney disease: Secondary | ICD-10-CM | POA: Diagnosis present

## 2019-12-16 DIAGNOSIS — D631 Anemia in chronic kidney disease: Secondary | ICD-10-CM | POA: Diagnosis present

## 2019-12-16 DIAGNOSIS — R1011 Right upper quadrant pain: Secondary | ICD-10-CM | POA: Diagnosis present

## 2019-12-16 DIAGNOSIS — J189 Pneumonia, unspecified organism: Secondary | ICD-10-CM

## 2019-12-16 DIAGNOSIS — J918 Pleural effusion in other conditions classified elsewhere: Secondary | ICD-10-CM | POA: Diagnosis present

## 2019-12-16 DIAGNOSIS — Z79890 Hormone replacement therapy (postmenopausal): Secondary | ICD-10-CM | POA: Diagnosis not present

## 2019-12-16 DIAGNOSIS — Z881 Allergy status to other antibiotic agents status: Secondary | ICD-10-CM

## 2019-12-16 DIAGNOSIS — F419 Anxiety disorder, unspecified: Secondary | ICD-10-CM

## 2019-12-16 DIAGNOSIS — Z7982 Long term (current) use of aspirin: Secondary | ICD-10-CM

## 2019-12-16 DIAGNOSIS — E7800 Pure hypercholesterolemia, unspecified: Secondary | ICD-10-CM | POA: Diagnosis present

## 2019-12-16 DIAGNOSIS — F329 Major depressive disorder, single episode, unspecified: Secondary | ICD-10-CM | POA: Diagnosis present

## 2019-12-16 HISTORY — DX: Unspecified osteoarthritis, unspecified site: M19.90

## 2019-12-16 LAB — CBC WITH DIFFERENTIAL/PLATELET
Abs Immature Granulocytes: 0.02 10*3/uL (ref 0.00–0.07)
Basophils Absolute: 0 10*3/uL (ref 0.0–0.1)
Basophils Relative: 0 %
Eosinophils Absolute: 0.4 10*3/uL (ref 0.0–0.5)
Eosinophils Relative: 5 %
HCT: 34.5 % — ABNORMAL LOW (ref 36.0–46.0)
Hemoglobin: 10.9 g/dL — ABNORMAL LOW (ref 12.0–15.0)
Immature Granulocytes: 0 %
Lymphocytes Relative: 16 %
Lymphs Abs: 1.2 10*3/uL (ref 0.7–4.0)
MCH: 26.7 pg (ref 26.0–34.0)
MCHC: 31.6 g/dL (ref 30.0–36.0)
MCV: 84.6 fL (ref 80.0–100.0)
Monocytes Absolute: 0.9 10*3/uL (ref 0.1–1.0)
Monocytes Relative: 12 %
Neutro Abs: 5.2 10*3/uL (ref 1.7–7.7)
Neutrophils Relative %: 67 %
Platelets: 150 10*3/uL (ref 150–400)
RBC: 4.08 MIL/uL (ref 3.87–5.11)
RDW: 14.7 % (ref 11.5–15.5)
WBC: 7.7 10*3/uL (ref 4.0–10.5)
nRBC: 0 % (ref 0.0–0.2)

## 2019-12-16 LAB — I-STAT CHEM 8, ED
BUN: 12 mg/dL (ref 8–23)
Calcium, Ion: 1.17 mmol/L (ref 1.15–1.40)
Chloride: 96 mmol/L — ABNORMAL LOW (ref 98–111)
Creatinine, Ser: 1 mg/dL (ref 0.44–1.00)
Glucose, Bld: 101 mg/dL — ABNORMAL HIGH (ref 70–99)
HCT: 34 % — ABNORMAL LOW (ref 36.0–46.0)
Hemoglobin: 11.6 g/dL — ABNORMAL LOW (ref 12.0–15.0)
Potassium: 3.6 mmol/L (ref 3.5–5.1)
Sodium: 133 mmol/L — ABNORMAL LOW (ref 135–145)
TCO2: 26 mmol/L (ref 22–32)

## 2019-12-16 LAB — URINALYSIS, ROUTINE W REFLEX MICROSCOPIC
Bilirubin Urine: NEGATIVE
Glucose, UA: NEGATIVE mg/dL
Hgb urine dipstick: NEGATIVE
Ketones, ur: NEGATIVE mg/dL
Leukocytes,Ua: NEGATIVE
Nitrite: NEGATIVE
Protein, ur: NEGATIVE mg/dL
Specific Gravity, Urine: 1.012 (ref 1.005–1.030)
pH: 5 (ref 5.0–8.0)

## 2019-12-16 LAB — COMPREHENSIVE METABOLIC PANEL
ALT: 11 U/L (ref 0–44)
AST: 14 U/L — ABNORMAL LOW (ref 15–41)
Albumin: 3 g/dL — ABNORMAL LOW (ref 3.5–5.0)
Alkaline Phosphatase: 116 U/L (ref 38–126)
Anion gap: 8 (ref 5–15)
BUN: 11 mg/dL (ref 8–23)
CO2: 26 mmol/L (ref 22–32)
Calcium: 8.6 mg/dL — ABNORMAL LOW (ref 8.9–10.3)
Chloride: 99 mmol/L (ref 98–111)
Creatinine, Ser: 0.97 mg/dL (ref 0.44–1.00)
GFR calc Af Amer: >60 mL/min (ref >60)
GFR calc non Af Amer: >60 mL/min (ref >60)
Glucose, Bld: 108 mg/dL — ABNORMAL HIGH (ref 70–99)
Potassium: 3.8 mmol/L (ref 3.5–5.1)
Sodium: 133 mmol/L — ABNORMAL LOW (ref 135–145)
Total Bilirubin: 1.1 mg/dL (ref 0.3–1.2)
Total Protein: 6.6 g/dL (ref 6.5–8.1)

## 2019-12-16 LAB — LIPASE, BLOOD: Lipase: 16 U/L (ref 11–51)

## 2019-12-16 MED ORDER — PANTOPRAZOLE SODIUM 40 MG PO TBEC
40.0000 mg | DELAYED_RELEASE_TABLET | Freq: Two times a day (BID) | ORAL | Status: DC
  Administered 2019-12-16 – 2019-12-25 (×18): 40 mg via ORAL
  Filled 2019-12-16 (×18): qty 1

## 2019-12-16 MED ORDER — OXYBUTYNIN CHLORIDE ER 10 MG PO TB24
10.0000 mg | ORAL_TABLET | Freq: Every day | ORAL | Status: DC
  Administered 2019-12-17 – 2019-12-25 (×9): 10 mg via ORAL
  Filled 2019-12-16 (×9): qty 1

## 2019-12-16 MED ORDER — OXYCODONE HCL 5 MG PO TABS
10.0000 mg | ORAL_TABLET | Freq: Four times a day (QID) | ORAL | Status: DC | PRN
  Administered 2019-12-16 – 2019-12-17 (×2): 10 mg via ORAL
  Filled 2019-12-16 (×3): qty 2

## 2019-12-16 MED ORDER — MORPHINE SULFATE (PF) 4 MG/ML IV SOLN
4.0000 mg | Freq: Once | INTRAVENOUS | Status: AC
  Administered 2019-12-16: 4 mg via INTRAVENOUS
  Filled 2019-12-16: qty 1

## 2019-12-16 MED ORDER — ONDANSETRON 4 MG PO TBDP: 4.0000 mg | ORAL_TABLET | Freq: Four times a day (QID) | ORAL | Status: DC | PRN

## 2019-12-16 MED ORDER — PIPERACILLIN-TAZOBACTAM 3.375 G IVPB
3.3750 g | Freq: Three times a day (TID) | INTRAVENOUS | Status: DC
  Administered 2019-12-16 – 2019-12-19 (×8): 3.375 g via INTRAVENOUS
  Filled 2019-12-16 (×8): qty 50

## 2019-12-16 MED ORDER — LEVOTHYROXINE SODIUM 50 MCG PO TABS
50.0000 ug | ORAL_TABLET | Freq: Every day | ORAL | Status: DC
  Administered 2019-12-17 – 2019-12-25 (×9): 50 ug via ORAL
  Filled 2019-12-16 (×9): qty 1

## 2019-12-16 MED ORDER — CYCLOSPORINE 0.05 % OP EMUL
1.0000 [drp] | Freq: Two times a day (BID) | OPHTHALMIC | Status: DC
  Administered 2019-12-16 – 2019-12-25 (×18): 1 [drp] via OPHTHALMIC
  Filled 2019-12-16 (×19): qty 1

## 2019-12-16 MED ORDER — HEPARIN SODIUM (PORCINE) 5000 UNIT/ML IJ SOLN
5000.0000 [IU] | Freq: Three times a day (TID) | INTRAMUSCULAR | Status: DC
  Administered 2019-12-16 – 2019-12-18 (×5): 5000 [IU] via SUBCUTANEOUS
  Filled 2019-12-16 (×5): qty 1

## 2019-12-16 MED ORDER — ROSUVASTATIN CALCIUM 5 MG PO TABS
10.0000 mg | ORAL_TABLET | Freq: Every day | ORAL | Status: DC
  Administered 2019-12-16 – 2019-12-24 (×9): 10 mg via ORAL
  Filled 2019-12-16 (×9): qty 2

## 2019-12-16 MED ORDER — CITALOPRAM HYDROBROMIDE 20 MG PO TABS
20.0000 mg | ORAL_TABLET | Freq: Every day | ORAL | Status: DC
  Administered 2019-12-17 – 2019-12-25 (×9): 20 mg via ORAL
  Filled 2019-12-16 (×9): qty 1

## 2019-12-16 MED ORDER — LISINOPRIL 5 MG PO TABS
5.0000 mg | ORAL_TABLET | Freq: Every day | ORAL | Status: DC
  Administered 2019-12-17 – 2019-12-22 (×6): 5 mg via ORAL
  Filled 2019-12-16 (×6): qty 1

## 2019-12-16 MED ORDER — ASPIRIN EC 81 MG PO TBEC
81.0000 mg | DELAYED_RELEASE_TABLET | Freq: Every day | ORAL | Status: DC
  Administered 2019-12-16 – 2019-12-24 (×9): 81 mg via ORAL
  Filled 2019-12-16 (×9): qty 1

## 2019-12-16 MED ORDER — ADULT MULTIVITAMIN W/MINERALS CH
1.0000 | ORAL_TABLET | Freq: Every day | ORAL | Status: DC
  Administered 2019-12-16 – 2019-12-24 (×9): 1 via ORAL
  Filled 2019-12-16 (×9): qty 1

## 2019-12-16 MED ORDER — LORATADINE 10 MG PO TABS
10.0000 mg | ORAL_TABLET | Freq: Every day | ORAL | Status: DC
  Administered 2019-12-17 – 2019-12-25 (×9): 10 mg via ORAL
  Filled 2019-12-16 (×9): qty 1

## 2019-12-16 MED ORDER — PIPERACILLIN-TAZOBACTAM 3.375 G IVPB 30 MIN
3.3750 g | Freq: Once | INTRAVENOUS | Status: AC
  Administered 2019-12-16: 3.375 g via INTRAVENOUS
  Filled 2019-12-16: qty 50

## 2019-12-16 MED ORDER — ATENOLOL 50 MG PO TABS
50.0000 mg | ORAL_TABLET | Freq: Every day | ORAL | Status: DC
  Administered 2019-12-17 – 2019-12-25 (×8): 50 mg via ORAL
  Filled 2019-12-16 (×9): qty 1

## 2019-12-16 MED ORDER — IOHEXOL 300 MG/ML  SOLN
100.0000 mL | Freq: Once | INTRAMUSCULAR | Status: AC | PRN
  Administered 2019-12-16: 100 mL via INTRAVENOUS

## 2019-12-16 MED ORDER — SODIUM CHLORIDE 0.9 % IV SOLN: INTRAVENOUS | Status: DC

## 2019-12-16 MED ORDER — PREGABALIN 75 MG PO CAPS
150.0000 mg | ORAL_CAPSULE | Freq: Two times a day (BID) | ORAL | Status: DC
  Administered 2019-12-16 – 2019-12-25 (×18): 150 mg via ORAL
  Filled 2019-12-16 (×18): qty 2

## 2019-12-16 MED ORDER — SODIUM FLUORIDE 1.1 % DT CREA: 1.0000 "application " | TOPICAL_CREAM | Freq: Two times a day (BID) | DENTAL | Status: DC

## 2019-12-16 MED ORDER — METHOCARBAMOL 750 MG PO TABS
750.0000 mg | ORAL_TABLET | Freq: Four times a day (QID) | ORAL | Status: DC | PRN
  Administered 2019-12-17 – 2019-12-24 (×7): 750 mg via ORAL
  Filled 2019-12-16 (×8): qty 1

## 2019-12-16 NOTE — ED Provider Notes (Signed)
[facility] EMERGENCY DEPARTMENT Provider Note   CSN: [id number] Arrival date & time: 12/16/19  1345     History Chief Complaint  Patient presents with  . Abdominal Pain    rt. side    Suzanne Harrison is a 69 y.o. female. With a PMH of HTN, HLD, and anxiety who presents to the ED c/o 2 days of gradually worsening RUQ pain. Pt had attempted laparoscopic cholecystectomy on 10/03/19 that was complicated by adhesions to the stomach and duodenum and had subsequent per chole drain placed on 10/04/19. She states that her time with the drain was uneventful and had it removed on 12/13/19. She states that the following day she began having RUQ pain that is constant but intermittently becomes stabbing. She denies drainage from drain site. Pt states that last PO was yesterday before onset of pain, however, she is able to tolerate fluids. No F/C, N/V/D, dysuria.   Abdominal Pain Associated symptoms: no chills, no dysuria, no fever, no nausea and no vomiting         Past Medical History:  Diagnosis Date  . CKD (chronic kidney disease)   . Gallstones 10/2019  . Hypercholesteremia   . Hypertension     Patient Active Problem List   Diagnosis Date Noted  . Acute cholecystitis 10/01/2019  . Referred ear pain 05/27/2016  . Hip arthritis 03/02/2013  . History of total hip arthroplasty 03/02/2012    Past Surgical History:  Procedure Laterality Date  . BACK SURGERY    . BILIARY DRAINAGE  10/03/2019   ATTEMPTED LAPAROSCOPIC CHOLECYSTECTOMY WITH PLACEMENT OF DRAIN (N/A Abdomen)  . CHOLECYSTECTOMY N/A 10/03/2019   Procedure: ATTEMPTED LAPAROSCOPIC CHOLECYSTECTOMY WITH PLACEMENT OF DRAIN;  Surgeon: Emelia Loron, MD;  Location: [facility] OR;  Service: General;  Laterality: N/A;  . IR RADIOLOGIST EVAL & MGMT  11/16/2019  . IR RADIOLOGIST EVAL & MGMT  12/13/2019  . REPLACEMENT TOTAL HIP W/  RESURFACING IMPLANTS       OB History   No obstetric history on file.     No family  history on file.  Social History   Tobacco Use  . Smoking status: Never Smoker  . Smokeless tobacco: Never Used  Substance Use Topics  . Alcohol use: No  . Drug use: No    Home Medications Prior to Admission medications   Medication Sig Start Date End Date Taking? Authorizing Provider  amoxicillin-clavulanate (AUGMENTIN) 875-125 MG tablet Take 1 tablet by mouth every 12 (twelve) hours. 10/06/19   Maczis, Elmer Sow, PA-C  aspirin EC 81 MG tablet Take 81 mg by mouth at bedtime.    [provider]  atenolol (TENORMIN) 50 MG tablet Take 50 mg by mouth daily. 09/18/19   [provider]  Calcium Carbonate-Vitamin D (CALCIUM-D PO) Take 2 tablets by mouth at bedtime.    [provider]  cetirizine (ZYRTEC) 10 MG tablet Take 10 mg by mouth daily.    [provider]  citalopram (CELEXA) 20 MG tablet Take 20 mg by mouth daily. 02/23/18   [provider]  cycloSPORINE (RESTASIS) 0.05 % ophthalmic emulsion Place 1 drop into both eyes 2 (two) times daily.    [provider]  DENTA 5000 PLUS 1.1 % CREA dental cream Place 1 application onto teeth 2 (two) times daily. Do not eat, drink or rinse for 30 minutes after use. 08/12/19   [provider]  levothyroxine (SYNTHROID, LEVOTHROID) 50 MCG tablet Take 50 mcg by mouth daily. 02/15/18  [provider]  lisinopril (PRINIVIL,ZESTRIL) 5 MG tablet Take 5 mg by mouth daily. 02/09/18   [provider]  methocarbamol (ROBAXIN) 750 MG tablet Take 1 tablet (750 mg total) by mouth every 6 (six) hours as needed for muscle spasms. 10/06/19   Maczis, Barth Kirks, PA-C  Multiple Vitamin (MULTIVITAMIN WITH MINERALS) TABS tablet Take 1 tablet by mouth at bedtime.    [provider]  Multiple Vitamins-Minerals (HAIR/SKIN/NAILS/BIOTIN) TABS Take 1 tablet by mouth 2 (two) times daily.    [provider]  ondansetron (ZOFRAN-ODT) 4 MG disintegrating tablet Take 1 tablet (4 mg total)  by mouth every 6 (six) hours as needed for nausea. 10/06/19   Maczis, Barth Kirks, PA-C  oxybutynin (DITROPAN-XL) 10 MG 24 hr tablet Take 10 mg by mouth daily. 02/13/18   [provider]  oxyCODONE 10 MG TABS Take 1 tablet (10 mg total) by mouth every 6 (six) hours as needed for severe pain or breakthrough pain. 10/06/19   Maczis, Barth Kirks, PA-C  pantoprazole (PROTONIX) 40 MG tablet Take 40 mg by mouth 2 (two) times daily. 02/11/18   [provider]  pregabalin (LYRICA) 150 MG capsule Take 150 mg by mouth 2 (two) times daily.     [provider]  rosuvastatin (CRESTOR) 10 MG tablet Take 10 mg by mouth at bedtime.  02/15/18   [provider]  sodium chloride flush (NS) 0.9 % SOLN Inject 10 mLs into the vein daily. 10/06/19   Maczis, Barth Kirks, PA-C    Allergies    Gatifloxacin and Sulfa antibiotics  Review of Systems   Review of Systems  Constitutional: Negative for chills and fever.  HENT: Negative.   Eyes: Negative.   Respiratory: Negative.   Gastrointestinal: Positive for abdominal pain. Negative for nausea and vomiting.  Endocrine: Negative.   Genitourinary: Negative for dysuria, flank pain and pelvic pain.  Musculoskeletal: Negative.   Skin: Negative for wound.  Neurological: Negative.   All other systems reviewed and are negative.   Physical Exam Updated Vital Signs BP (!) 133/56 (BP Location: Right Arm)   Pulse 65   Temp 98.1 F (36.7 C) (Oral)   Resp 16   Ht 5\' 2"  (1.575 m)   Wt 79.8 kg   LMP  (LMP Unknown)   SpO2 99%   BMI 32.19 kg/m   Physical Exam Vitals and nursing note reviewed.  Constitutional:      General: She is not in acute distress.    Appearance: She is well-developed. She is not diaphoretic.  HENT:     Head: Normocephalic and atraumatic.  Eyes:     General: No scleral icterus.    Conjunctiva/sclera: Conjunctivae normal.  Cardiovascular:     Rate and Rhythm: Normal rate and regular rhythm.     Heart sounds: Normal  heart sounds. No murmur. No friction rub. No gallop.   Pulmonary:     Effort: Pulmonary effort is normal. No respiratory distress.     Breath sounds: Normal breath sounds.  Abdominal:     General: Bowel sounds are normal. There is no distension.     Palpations: Abdomen is soft. There is no mass.     Tenderness: There is abdominal tenderness in the right upper quadrant. There is no guarding.     Comments: Drain site is well healing.  No heat, redness, swelling or erythema.    Musculoskeletal:     Cervical back: Normal range of motion.  Skin:    General: Skin  is warm and dry.  Neurological:     Mental Status: She is alert and oriented to person, place, and time.  Psychiatric:        Behavior: Behavior normal.     ED Results / Procedures / Treatments   Labs (all labs ordered are listed, but only abnormal results are displayed) Labs Reviewed  COMPREHENSIVE METABOLIC PANEL  CBC WITH DIFFERENTIAL/PLATELET  URINALYSIS, ROUTINE W REFLEX MICROSCOPIC  LIPASE, BLOOD  I-STAT CHEM 8, ED    EKG None  Radiology No results found.  Procedures Procedures (including critical care time)  Medications Ordered in ED Medications - No data to display  ED Course  I have reviewed the triage vital signs and the nursing notes.  Pertinent labs & imaging results that were available during my care of the patient were reviewed by me and considered in my medical decision making (see chart for details).    MDM Rules/Calculators/A&P                      Patient here with right upper quadrant abdominal pain after recent drain removal.  This was removed on Wednesday of this week.  She feels distended but there is no obvious distention, erythema, swelling or drainage.  Plan labs, right upper quadrant abdominal ultrasound, pain control. Sign out to PA Geiple at shift change. Final Clinical Impression(s) / ED Diagnoses Final diagnoses:  Abdominal pain, RUQ    Rx / DC Orders ED Discharge Orders      None       Arthor Captain, PA-C 12/16/19 1501    Eber Hong, MD 12/16/19 1611    Eber Hong, MD 12/16/19 1742

## 2019-12-16 NOTE — ED Notes (Signed)
Attempted to call report x 1  

## 2019-12-16 NOTE — ED Notes (Signed)
Patient transported to Ultrasound 

## 2019-12-16 NOTE — ED Notes (Signed)
ED TO INPATIENT HANDOFF REPORT  ED Nurse Name and Phone #: [phone] Wendie Simmer., RN  S Name/Age/Gender Suzanne Harrison 69 y.o. female Room/Bed: H011C/H011C  Code Status   Code Status: Full Code  Home/SNF/Other Home Patient oriented to: self, place, time and situation Is this baseline? Yes   Triage Complete: Triage complete  Chief Complaint Pleural effusion [J90]  Triage Note Pt. Stated, Im having rt. Side pain toward back. I had a tube draining my gall bladder and they capped it off on Wed. And pain started Thursday. Im suppose to have my gall bladder out but it was attached so could not do.    Allergies Allergies  Allergen Reactions  . Gatifloxacin Rash and Other (See Comments)    Other reaction(s): Myalgias (Muscle Pain) Rash in mouth   . Sulfa Antibiotics Nausea Only    Level of Care/Admitting Diagnosis ED Disposition    ED Disposition Condition Comment   Admit  Hospital Area: [facility] [[id number]]  Level of Care: Med-Surg [16]  Covid Evaluation: Asymptomatic Screening Protocol (No Symptoms)  Diagnosis: Pleural effusion [242230]  Admitting Physician: Emeline General [[id number]]  Attending Physician: Emeline General [[id number]]  Estimated length of stay: 3 - 4 days  Certification:: I certify this patient will need inpatient services for at least 2 midnights       B Medical/Surgery History Past Medical History:  Diagnosis Date  . CKD (chronic kidney disease)   . Gallstones 10/2019  . Hypercholesteremia   . Hypertension    Past Surgical History:  Procedure Laterality Date  . BACK SURGERY    . BILIARY DRAINAGE  10/03/2019   ATTEMPTED LAPAROSCOPIC CHOLECYSTECTOMY WITH PLACEMENT OF DRAIN (N/A Abdomen)  . CHOLECYSTECTOMY N/A 10/03/2019   Procedure: ATTEMPTED LAPAROSCOPIC CHOLECYSTECTOMY WITH PLACEMENT OF DRAIN;  Surgeon: Emelia Loron, MD;  Location: [facility] OR;  Service: General;  Laterality: N/A;  . IR RADIOLOGIST EVAL & MGMT  11/16/2019  .  IR RADIOLOGIST EVAL & MGMT  12/13/2019  . REPLACEMENT TOTAL HIP W/  RESURFACING IMPLANTS       A IV Location/Drains/Wounds Patient Lines/Drains/Airways Status   Active Line/Drains/Airways    Name:   Placement date:   Placement time:   Site:   Days:   Peripheral IV 12/16/19 Left Antecubital   12/16/19    1502    Antecubital   less than 1   Closed System Drain 2 Right RUQ Other (Comment) 10 Fr.   10/04/19    1248    RUQ   73   Incision (Closed) 10/03/19 Abdomen Other (Comment)   10/03/19    1607     74   Incision - 3 Ports Abdomen 1: Umbilicus 2: Mid;Upper 3: Right;Lateral   10/03/19    1613     74          Intake/Output Last 24 hours  Intake/Output Summary (Last 24 hours) at 12/16/2019 1923 Last data filed at 12/16/2019 1815 Gross per 24 hour  Intake 1100 ml  Output -  Net 1100 ml    Labs/Imaging Results for orders placed or performed during the hospital encounter of 12/16/19 (from the past 48 hour(s))  Comprehensive metabolic panel     Status: Abnormal   Collection Time: 12/16/19  2:00 PM  Result Value Ref Range   Sodium 133 (L) 135 - 145 mmol/L   Potassium 3.8 3.5 - 5.1 mmol/L   Chloride 99 98 - 111 mmol/L   CO2 26 22 - 32 mmol/L  Glucose, Bld 108 (H) 70 - 99 mg/dL   BUN 11 8 - 23 mg/dL   Creatinine, Ser 7.32 0.44 - 1.00 mg/dL   Calcium 8.6 (L) 8.9 - 10.3 mg/dL   Total Protein 6.6 6.5 - 8.1 g/dL   Albumin 3.0 (L) 3.5 - 5.0 g/dL   AST 14 (L) 15 - 41 U/L   ALT 11 0 - 44 U/L   Alkaline Phosphatase 116 38 - 126 U/L   Total Bilirubin 1.1 0.3 - 1.2 mg/dL   GFR calc non Af Amer >60 >60 mL/min   GFR calc Af Amer >60 >60 mL/min   Anion gap 8 5 - 15    Comment: Performed at [facility], [address], [place], [place] [address]  CBC with Differential     Status: Abnormal   Collection Time: 12/16/19  2:00 PM  Result Value Ref Range   WBC 7.7 4.0 - 10.5 K/uL   RBC 4.08 3.87 - 5.11 MIL/uL   Hemoglobin 10.9 (L) 12.0 - 15.0 g/dL   HCT 27.0 (L) 62.3 - 76.2 %   MCV  84.6 80.0 - 100.0 fL   MCH 26.7 26.0 - 34.0 pg   MCHC 31.6 30.0 - 36.0 g/dL   RDW 83.1 51.7 - 61.6 %   Platelets 150 150 - 400 K/uL   nRBC 0.0 0.0 - 0.2 %   Neutrophils Relative % 67 %   Neutro Abs 5.2 1.7 - 7.7 K/uL   Lymphocytes Relative 16 %   Lymphs Abs 1.2 0.7 - 4.0 K/uL   Monocytes Relative 12 %   Monocytes Absolute 0.9 0.1 - 1.0 K/uL   Eosinophils Relative 5 %   Eosinophils Absolute 0.4 0.0 - 0.5 K/uL   Basophils Relative 0 %   Basophils Absolute 0.0 0.0 - 0.1 K/uL   Immature Granulocytes 0 %   Abs Immature Granulocytes 0.02 0.00 - 0.07 K/uL    Comment: Performed at [facility], [address] [place], [place] [address]  Lipase     Status: None   Collection Time: 12/16/19  2:00 PM  Result Value Ref Range   Lipase 16 11 - 51 U/L    Comment: Performed at [facility], [address], [place], [place] [address]  I-stat chem 8, ED (not at [facility] or [facility])     Status: Abnormal   Collection Time: 12/16/19  4:05 PM  Result Value Ref Range   Sodium 133 (L) 135 - 145 mmol/L   Potassium 3.6 3.5 - 5.1 mmol/L   Chloride 96 (L) 98 - 111 mmol/L   BUN 12 8 - 23 mg/dL   Creatinine, Ser 4.85 0.44 - 1.00 mg/dL   Glucose, Bld 462 (H) 70 - 99 mg/dL   Calcium, Ion 7.03 5.00 - 1.40 mmol/L   TCO2 26 22 - 32 mmol/L   Hemoglobin 11.6 (L) 12.0 - 15.0 g/dL   HCT 93.8 (L) 18.2 - 99.3 %  Urinalysis, Routine w reflex microscopic     Status: None   Collection Time: 12/16/19  4:25 PM  Result Value Ref Range   Color, Urine YELLOW YELLOW   APPearance CLEAR CLEAR   Specific Gravity, Urine 1.012 1.005 - 1.030   pH 5.0 5.0 - 8.0   Glucose, UA NEGATIVE NEGATIVE mg/dL   Hgb urine dipstick NEGATIVE NEGATIVE   Bilirubin Urine NEGATIVE NEGATIVE   Ketones, ur NEGATIVE NEGATIVE mg/dL   Protein, ur NEGATIVE NEGATIVE mg/dL   Nitrite NEGATIVE NEGATIVE  Leukocytes,Ua NEGATIVE NEGATIVE    Comment: Performed at [facility], [address] [place], [place] [address]   CT ABDOMEN PELVIS W  CONTRAST  Result Date: 12/16/2019 CLINICAL DATA:  Right upper quadrant abdominal pain, complicated postsurgical course with reported gallbladder surgery EXAM: CT ABDOMEN AND PELVIS WITH CONTRAST TECHNIQUE: Multidetector CT imaging of the abdomen and pelvis was performed using the standard protocol following bolus administration of intravenous contrast. CONTRAST:  143mL OMNIPAQUE IOHEXOL 300 MG/ML  SOLN COMPARISON:  Percutaneous CT-guided imaging 10/04/2019, MR abdomen 10/02/2019, CT abdomen 10/01/2019 FINDINGS: Lower chest: Loculated right pleural effusion with some adjacent atelectatic changes in the lungs as well as more peripheral ground-glass and reticulonodular opacity in the right lower lung. Question some mild peripheral enhancement and pleural thickening. More bandlike areas of opacity compatible with atelectasis and/or scarring. Hepatobiliary: There are surgical clips in the gallbladder fossa likely reflecting at least partial cholecystectomy as well as a rib hypoattenuating tract through the liver parenchyma extending laterally compatible with prior placement of a cholecystostomy tube. Trace amount of fluid is seen along the lateral aspect of the liver surface extending towards the left lateral abdominal wall with some faint residual intercostal thickening at the level of the ninth interspace on the right (3/23) no other focal liver abnormality is seen. No acute pericholecystic inflammation is seen. No gallbladder wall thickening. No visible calcified intraluminal or intraductal gallstones. No biliary ductal dilatation. Pancreas: Fatty replacement of the pancreas. No pancreatic ductal dilatation or surrounding inflammatory changes. Spleen: Normal in size without focal abnormality. Small accessory splenules. Adrenals/Urinary Tract: Normal adrenal glands. Symmetric enhancement and excretion of the kidneys. Multiple fluid attenuation cysts are again seen in both kidneys, largest in the lower pole left  kidney with some thin peripheral mural calcification (Bosniak II). Additional fat attenuation lesion in the upper pole left kidney compatible with angiomyolipoma. Bilateral cortical cysts are noted. Left extrarenal pelvis is noted. No worrisome renal lesions, obstructive urolithiasis or hydronephrosis. Bladder is unremarkable. Stomach/Bowel: Distal esophagus, stomach and duodenal sweep are unremarkable. No small bowel wall thickening or dilatation. No evidence of obstruction. Appendix not well visualized. No focal pericecal inflammation is suggest occult appendicitis. Question some mild edematous mural thickening of the distal colon from the splenic flexure to the level of the rectum. Scattered colonic diverticula without focal pericolonic inflammation to suggest diverticulitis. Vascular/Lymphatic: Minimal atherosclerotic plaque within the normal caliber aorta. Reactive lymph nodes seen in the lower mediastinum near the GE junction. Few reactive nodes in the abdomen as well. No pathologically enlarged nodes. Reproductive: Normal appearance of the uterus and adnexal structures. Other: No free fluid or free air in the abdomen or pelvis. Perihepatic fluid and cholecystostomy tract, as above. Postsurgical changes of the anterior abdominal wall. Musculoskeletal: Extensive thoracolumbar fusion and bilateral total hip arthroplasties with significant metallic streak artifact. No acute hardware complication is seen. Prior L3 compression deformity is similar comparison. Underlying multilevel degenerative changes are present. IMPRESSION: 1. Surgical clips in the gallbladder fossa likely reflecting at least partial cholecystectomy as well as a rib hypoattenuating tract extending laterally through the liver parenchyma compatible with prior placement of a cholecystostomy tube. Trace amount of fluid along the lateral aspect of the liver surface extending towards the left lateral abdominal wall with some faint residual intercostal  thickening at the level of the ninth interspace on the right. No CT evidence of acute pericholecystic inflammation. 2. Loculated right pleural effusion with some questionable pleural thickening and enhancement worrisome for empyema. While this could  reflect a primary lung process such as a developing pneumonia with parapneumonic effusion, given some residual thickening adjacent the diaphragm could reflect traversal of the lateral-most diaphragmatic recess with placement and subsequent removal of the cholecystostomy tube. Direct sampling could better ascertain the origin of this fluid. 3. Question some mild edematous mural thickening of the distal colon from the splenic flexure to the level of the rectum. Findings could represent a mild colitis. 4. Colonic diverticula without evidence of acute diverticulitis. 5. 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 6. Extensive thoracolumbar fusion and bilateral total hip arthroplasties with significant metallic streak artifact. No acute hardware complication. 7.  Aortic Atherosclerosis (ICD10-I70.0). These results were called by telephone at the time of interpretation on 12/16/2019 at 5:22 pm to provider [facility] , who verbally acknowledged these results. Electronically Signed   By: Kreg Shropshire M.D.   On: 12/16/2019 17:23   US Abdomen Limited RUQ  Result Date: 12/16/2019 CLINICAL DATA:  Post removal of gallbladder drain. EXAM: ULTRASOUND ABDOMEN LIMITED RIGHT UPPER QUADRANT COMPARISON:  October 02, 2019 FINDINGS: Gallbladder: The gallbladder is normally distended. There is wall thickening of 7.8 mm. The sonographic Murphy's sign was reported as negative. Gallbladder calculus measures 7 mm. Common bile duct: Diameter: 3.6 mm Liver: No focal lesion identified. Within normal limits in parenchymal echogenicity. Portal vein is patent on color Doppler imaging with normal direction of blood flow towards the liver. Other: Small right pleural  effusion. 3.9 cm right upper pole renal cyst. IMPRESSION: Residual gallbladder wall thickening along the interface with the liver of 7.8 mm. 7 mm nonobstructive gallstone. Small right pleural effusion. Electronically Signed   By: Ted Mcalpine M.D.   On: 12/16/2019 15:38    Pending Labs Unresulted Labs (From admission, onward)    Start     Ordered   12/17/19 0500  CBC  Tomorrow morning,   R     12/16/19 1851   12/17/19 0500  Basic metabolic panel  Tomorrow morning,   R     12/16/19 1851   12/16/19 1924  SARS CORONAVIRUS 2 (TAT 6-24 HRS) Nasopharyngeal Nasopharyngeal Swab  (Tier 3 (TAT 6-24 hrs))  Once,   STAT    Question Answer Comment  Is this test for diagnosis or screening Screening   Symptomatic for COVID-19 as defined by CDC No   Hospitalized for COVID-19 No   Admitted to ICU for COVID-19 No   Previously tested for COVID-19 Yes   Resident in a congregate (group) care setting No   Employed in healthcare setting No   Pregnant No      12/16/19 1923          Vitals/Pain Today's Vitals   12/16/19 1630 12/16/19 1731 12/16/19 1801 12/16/19 1815  BP: 134/71 (!) 156/72  (!) 143/62  Pulse: 66 72  68  Resp:    16  Temp:      TempSrc:      SpO2: 95% 95%  94%  Weight:      Height:      PainSc:   5      Isolation Precautions No active isolations  Medications Medications  0.9 %  sodium chloride infusion ( Intravenous New Bag/Given 12/16/19 1815)  aspirin EC tablet 81 mg (has no administration in time range)  Oxycodone HCl TABS 10 mg (has no administration in time range)  atenolol (TENORMIN) tablet 50 mg (has no administration in time range)  lisinopril (ZESTRIL) tablet 5 mg (has no  administration in time range)  rosuvastatin (CRESTOR) tablet 10 mg (has no administration in time range)  citalopram (CELEXA) tablet 20 mg (has no administration in time range)  levothyroxine (SYNTHROID) tablet 50 mcg (has no administration in time range)  ondansetron (ZOFRAN-ODT)  disintegrating tablet 4 mg (has no administration in time range)  pantoprazole (PROTONIX) EC tablet 40 mg (has no administration in time range)  oxybutynin (DITROPAN-XL) 24 hr tablet 10 mg (has no administration in time range)  methocarbamol (ROBAXIN) tablet 750 mg (has no administration in time range)  pregabalin (LYRICA) capsule 150 mg (has no administration in time range)  multivitamin with minerals tablet 1 tablet (has no administration in time range)  loratadine (CLARITIN) tablet 10 mg (has no administration in time range)  cycloSPORINE (RESTASIS) 0.05 % ophthalmic emulsion 1 drop (has no administration in time range)  sodium fluoride (PREVIDENT 5000 PLUS) 1.1 % dental cream 1 application (has no administration in time range)  heparin injection 5,000 Units (has no administration in time range)  piperacillin-tazobactam (ZOSYN) IVPB 3.375 g (has no administration in time range)  morphine 4 MG/ML injection 4 mg (4 mg Intravenous Given 12/16/19 1624)  iohexol (OMNIPAQUE) 300 MG/ML solution 100 mL (100 mLs Intravenous Contrast Given 12/16/19 1644)  piperacillin-tazobactam (ZOSYN) IVPB 3.375 g (0 g Intravenous Stopped 12/16/19 1815)    Mobility walks Low fall risk   Focused Assessments Pulmonary Assessment Handoff:  Lung sounds:   O2 Device: Room Air        R Recommendations: See Admitting Provider Note  Report given to:   Additional Notes:

## 2019-12-16 NOTE — ED Triage Notes (Signed)
Pt. Stated, Im having rt. Side pain toward back. I had a tube draining my gall bladder and they capped it off on Wed. And pain started Thursday. Im suppose to have my gall bladder out but it was attached so could not do.

## 2019-12-16 NOTE — ED Notes (Signed)
Dinner Tray Ordered @ 1859. 

## 2019-12-16 NOTE — ED Notes (Signed)
Urine Culture sent to Main Lab with UA 

## 2019-12-16 NOTE — H&P (Signed)
History and Physical    Suzanne Harrison GEX:[id number] DOB: 04/06/1951 DOA: 12/16/2019  PCP: Maurice SmallGriffin, Elaine, MD  Patient coming from: Home  I have personally briefly reviewed patient's [facility]  Chief Complaint: Chest pain  HPI: Suzanne Harrison is a 69 y.o. female with medical history significant of CKD stage I, hypertension hyperlipidemia, recent cholecystostomy after having had difficulty removing her gallbladder.    10 days ago, she had cholecystostomy drainage tube removed, and at the point she did not have any chest pain were abdominal pain.  But 2 days ago, started to experience increasing pain mostly as the day goes on located in the right upper right lateral and right flank area, has been constant, sharp-like worsened with deep breathing, that she had low-grade subjective fever at home up to 99 F.    She denies any cough, no chills, no nauseous vomiting no diarrhea.   ED Course: CT scan shows infected the patient has a loculated pleural effusion on the right, there is some concern from the radiologist that the catheter that had been placed possibly injured the diaphragm allowing biliary fluid to leak up into the chest.  This certainly explains the patient's difficulty taking a deep breath. nterventional radiology was consulted for thoracentesis to rule out empyema.  Review of Systems: As per HPI otherwise 10 point review of systems negative.   Past Medical History:  Diagnosis Date  . CKD (chronic kidney disease)   . Gallstones 10/2019  . Hypercholesteremia   . Hypertension     Past Surgical History:  Procedure Laterality Date  . BACK SURGERY    . BILIARY DRAINAGE  10/03/2019   ATTEMPTED LAPAROSCOPIC CHOLECYSTECTOMY WITH PLACEMENT OF DRAIN (N/A Abdomen)  . CHOLECYSTECTOMY N/A 10/03/2019   Procedure: ATTEMPTED LAPAROSCOPIC CHOLECYSTECTOMY WITH PLACEMENT OF DRAIN;  Surgeon: Emelia LoronWakefield, Matthew, MD;  [facility];  Service: General;   Laterality: N/A;  . IR RADIOLOGIST EVAL & MGMT  11/16/2019  . IR RADIOLOGIST EVAL & MGMT  12/13/2019  . REPLACEMENT TOTAL HIP W/  RESURFACING IMPLANTS       reports that she has never smoked. She has never used smokeless tobacco. She reports that she does not drink alcohol or use drugs.  Allergies  Allergen Reactions  . Gatifloxacin Rash and Other (See Comments)    Other reaction(s): Myalgias (Muscle Pain) Rash in mouth   . Sulfa Antibiotics Nausea Only   HTN runs in family  Prior to Admission medications   Medication Sig Start Date End Date Taking? Authorizing Provider  amoxicillin-clavulanate (AUGMENTIN) 875-125 MG tablet Take 1 tablet by mouth every 12 (twelve) hours. 10/06/19   Maczis, Elmer SowMichael M, PA-C  aspirin EC 81 MG tablet Take 81 mg by mouth at bedtime.    [provider]  atenolol (TENORMIN) 50 MG tablet Take 50 mg by mouth daily. 09/18/19   [provider]  Calcium Carbonate-Vitamin D (CALCIUM-D PO) Take 2 tablets by mouth at bedtime.    [provider]  cetirizine (ZYRTEC) 10 MG tablet Take 10 mg by mouth daily.    [provider]  citalopram (CELEXA) 20 MG tablet Take 20 mg by mouth daily. 02/23/18   [provider]  cycloSPORINE (RESTASIS) 0.05 % ophthalmic emulsion Place 1 drop into both eyes 2 (two) times daily.    [provider]  DENTA 5000 PLUS 1.1 % CREA dental cream Place 1 application onto teeth 2 (two) times daily. Do not eat, drink or rinse  for 30 minutes after use. 08/12/19   [provider]  levothyroxine (SYNTHROID, LEVOTHROID) 50 MCG tablet Take 50 mcg by mouth daily. 02/15/18   [provider]  lisinopril (PRINIVIL,ZESTRIL) 5 MG tablet Take 5 mg by mouth daily. 02/09/18   [provider]  methocarbamol (ROBAXIN) 750 MG tablet Take 1 tablet (750 mg total) by mouth every 6 (six) hours as needed for muscle spasms. 10/06/19   Maczis, Elmer Sow, PA-C  Multiple Vitamin (MULTIVITAMIN WITH  MINERALS) TABS tablet Take 1 tablet by mouth at bedtime.    [provider]  Multiple Vitamins-Minerals (HAIR/SKIN/NAILS/BIOTIN) TABS Take 1 tablet by mouth 2 (two) times daily.    [provider]  ondansetron (ZOFRAN-ODT) 4 MG disintegrating tablet Take 1 tablet (4 mg total) by mouth every 6 (six) hours as needed for nausea. 10/06/19   Maczis, Elmer Sow, PA-C  oxybutynin (DITROPAN-XL) 10 MG 24 hr tablet Take 10 mg by mouth daily. 02/13/18   [provider]  oxyCODONE 10 MG TABS Take 1 tablet (10 mg total) by mouth every 6 (six) hours as needed for severe pain or breakthrough pain. 10/06/19   Maczis, Elmer Sow, PA-C  pantoprazole (PROTONIX) 40 MG tablet Take 40 mg by mouth 2 (two) times daily. 02/11/18   [provider]  pregabalin (LYRICA) 150 MG capsule Take 150 mg by mouth 2 (two) times daily.     [provider]  rosuvastatin (CRESTOR) 10 MG tablet Take 10 mg by mouth at bedtime.  02/15/18   [provider]  sodium chloride flush (NS) 0.9 % SOLN Inject 10 mLs into the vein daily. 10/06/19   Jacinto Halim, PA-C    Physical Exam: Vitals:   12/16/19 1623 12/16/19 1630 12/16/19 1731 12/16/19 1815  BP: 138/69 134/71 (!) 156/72 (!) 143/62  Pulse: 69 66 72 68  Resp:    16  Temp:      TempSrc:      SpO2: 98% 95% 95% 94%  Weight:      Height:        Constitutional: NAD, calm, comfortable Vitals:   12/16/19 1623 12/16/19 1630 12/16/19 1731 12/16/19 1815  BP: 138/69 134/71 (!) 156/72 (!) 143/62  Pulse: 69 66 72 68  Resp:    16  Temp:      TempSrc:      SpO2: 98% 95% 95% 94%  Weight:      Height:       Eyes: PERRL, lids and conjunctivae normal ENMT: Mucous membranes are moist. Posterior pharynx clear of any exudate or lesions.Normal dentition.  Neck: normal, supple, no masses, no thyromegaly Respiratory: clear to auscultation bilaterally, no wheezing, no crackles. Normal respiratory effort. No accessory muscle use.  decreased  breathing sounds on the right lower field. Cardiovascular: Regular rate and rhythm, no murmurs / rubs / gallops. No extremity edema. 2+ pedal pulses. No carotid bruits.  Abdomen: no tenderness, no masses palpated.  Right upper quadrant cholecystotomy insertion site healing well, no hepatosplenomegaly. Bowel sounds positive.  Musculoskeletal: no clubbing / cyanosis. No joint deformity upper and lower extremities. Good ROM, no contractures. Normal muscle tone.  Skin: no rashes, lesions, ulcers. No induration Neurologic: CN 2-12 grossly intact. Sensation intact, DTR normal. Strength 5/5 in all 4.  Psychiatric: Normal judgment and insight. Alert and oriented x 3. Normal mood.     Labs on Admission: I have personally reviewed following labs and imaging studies  CBC: Recent Labs  Lab 12/16/19 1400 12/16/19 1605  WBC 7.7  --   NEUTROABS 5.2  --   HGB 10.9* 11.6*  HCT 34.5* 34.0*  MCV 84.6  --   PLT 150  --    Basic Metabolic Panel: Recent Labs  Lab 12/16/19 1400 12/16/19 1605  NA 133* 133*  K 3.8 3.6  CL 99 96*  CO2 26  --   GLUCOSE 108* 101*  BUN 11 12  CREATININE 0.97 1.00  CALCIUM 8.6*  --    GFR: Estimated Creatinine Clearance: 53.4 mL/min (by C-G formula based on SCr of 1 mg/dL). Liver Function Tests: Recent Labs  Lab 12/16/19 1400  AST 14*  ALT 11  ALKPHOS 116  BILITOT 1.1  PROT 6.6  ALBUMIN 3.0*   Recent Labs  Lab 12/16/19 1400  LIPASE 16   No results for input(s): AMMONIA in the last 168 hours. Coagulation Profile: No results for input(s): INR, PROTIME in the last 168 hours. Cardiac Enzymes: No results for input(s): CKTOTAL, CKMB, CKMBINDEX, TROPONINI in the last 168 hours. BNP (last 3 results) No results for input(s): PROBNP in the last 8760 hours. HbA1C: No results for input(s): HGBA1C in the last 72 hours. CBG: No results for input(s): GLUCAP in the last 168 hours. Lipid Profile: No results for input(s): CHOL, HDL, LDLCALC, TRIG, CHOLHDL,  LDLDIRECT in the last 72 hours. Thyroid Function Tests: No results for input(s): TSH, T4TOTAL, FREET4, T3FREE, THYROIDAB in the last 72 hours. Anemia Panel: No results for input(s): VITAMINB12, FOLATE, FERRITIN, TIBC, IRON, RETICCTPCT in the last 72 hours. Urine analysis:    Component Value Date/Time   COLORURINE YELLOW 12/16/2019 1625   APPEARANCEUR CLEAR 12/16/2019 1625   LABSPEC 1.012 12/16/2019 1625   PHURINE 5.0 12/16/2019 1625   GLUCOSEU NEGATIVE 12/16/2019 1625   HGBUR NEGATIVE 12/16/2019 1625   BILIRUBINUR NEGATIVE 12/16/2019 1625   KETONESUR NEGATIVE 12/16/2019 1625   PROTEINUR NEGATIVE 12/16/2019 1625   NITRITE NEGATIVE 12/16/2019 1625   LEUKOCYTESUR NEGATIVE 12/16/2019 1625    Radiological Exams on Admission: CT ABDOMEN PELVIS W CONTRAST  Result Date: 12/16/2019 CLINICAL DATA:  Right upper quadrant abdominal pain, complicated postsurgical course with reported gallbladder surgery EXAM: CT ABDOMEN AND PELVIS WITH CONTRAST TECHNIQUE: Multidetector CT imaging of the abdomen and pelvis was performed using the standard protocol following bolus administration of intravenous contrast. CONTRAST:  111mL OMNIPAQUE IOHEXOL 300 MG/ML  SOLN COMPARISON:  Percutaneous CT-guided imaging 10/04/2019, MR abdomen 10/02/2019, CT abdomen 10/01/2019 FINDINGS: Lower chest: Loculated right pleural effusion with some adjacent atelectatic changes in the lungs as well as more peripheral ground-glass and reticulonodular opacity in the right lower lung. Question some mild peripheral enhancement and pleural thickening. More bandlike areas of opacity compatible with atelectasis and/or scarring. Hepatobiliary: There are surgical clips in the gallbladder fossa likely reflecting at least partial cholecystectomy as well as a rib hypoattenuating tract through the liver parenchyma extending laterally compatible with prior placement of a cholecystostomy tube. Trace amount of fluid is seen along the lateral aspect of the  liver surface extending towards the left lateral abdominal wall with some faint residual intercostal thickening at the level of the ninth interspace on the right (3/23) no other focal liver abnormality is seen. No acute pericholecystic inflammation is seen. No gallbladder wall thickening. No visible calcified intraluminal or intraductal gallstones. No biliary ductal dilatation. Pancreas: Fatty replacement of the pancreas. No pancreatic ductal dilatation or surrounding inflammatory changes. Spleen: Normal in size without focal abnormality. Small accessory splenules. Adrenals/Urinary Tract: Normal adrenal glands. Symmetric enhancement and excretion  of the kidneys. Multiple fluid attenuation cysts are again seen in both kidneys, largest in the lower pole left kidney with some thin peripheral mural calcification (Bosniak II). Additional fat attenuation lesion in the upper pole left kidney compatible with angiomyolipoma. Bilateral cortical cysts are noted. Left extrarenal pelvis is noted. No worrisome renal lesions, obstructive urolithiasis or hydronephrosis. Bladder is unremarkable. Stomach/Bowel: Distal esophagus, stomach and duodenal sweep are unremarkable. No small bowel wall thickening or dilatation. No evidence of obstruction. Appendix not well visualized. No focal pericecal inflammation is suggest occult appendicitis. Question some mild edematous mural thickening of the distal colon from the splenic flexure to the level of the rectum. Scattered colonic diverticula without focal pericolonic inflammation to suggest diverticulitis. Vascular/Lymphatic: Minimal atherosclerotic plaque within the normal caliber aorta. Reactive lymph nodes seen in the lower mediastinum near the GE junction. Few reactive nodes in the abdomen as well. No pathologically enlarged nodes. Reproductive: Normal appearance of the uterus and adnexal structures. Other: No free fluid or free air in the abdomen or pelvis. Perihepatic fluid and  cholecystostomy tract, as above. Postsurgical changes of the anterior abdominal wall. Musculoskeletal: Extensive thoracolumbar fusion and bilateral total hip arthroplasties with significant metallic streak artifact. No acute hardware complication is seen. Prior L3 compression deformity is similar comparison. Underlying multilevel degenerative changes are present. IMPRESSION: 1. Surgical clips in the gallbladder fossa likely reflecting at least partial cholecystectomy as well as a rib hypoattenuating tract extending laterally through the liver parenchyma compatible with prior placement of a cholecystostomy tube. Trace amount of fluid along the lateral aspect of the liver surface extending towards the left lateral abdominal wall with some faint residual intercostal thickening at the level of the ninth interspace on the right. No CT evidence of acute pericholecystic inflammation. 2. Loculated right pleural effusion with some questionable pleural thickening and enhancement worrisome for empyema. While this could reflect a primary lung process such as a developing pneumonia with parapneumonic effusion, given some residual thickening adjacent the diaphragm could reflect traversal of the lateral-most diaphragmatic recess with placement and subsequent removal of the cholecystostomy tube. Direct sampling could better ascertain the origin of this fluid. 3. Question some mild edematous mural thickening of the distal colon from the splenic flexure to the level of the rectum. Findings could represent a mild colitis. 4. Colonic diverticula without evidence of acute diverticulitis. 5. 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 6. Extensive thoracolumbar fusion and bilateral total hip arthroplasties with significant metallic streak artifact. No acute hardware complication. 7.  Aortic Atherosclerosis (ICD10-I70.0). These results were called by telephone at the time of interpretation on 12/16/2019 at  5:22 pm to provider [facility] , who verbally acknowledged these results. Electronically Signed   By: Kreg Shropshire M.D.   On: 12/16/2019 17:23   US Abdomen Limited RUQ  Result Date: 12/16/2019 CLINICAL DATA:  Post removal of gallbladder drain. EXAM: ULTRASOUND ABDOMEN LIMITED RIGHT UPPER QUADRANT COMPARISON:  October 02, 2019 FINDINGS: Gallbladder: The gallbladder is normally distended. There is wall thickening of 7.8 mm. The sonographic Murphy's sign was reported as negative. Gallbladder calculus measures 7 mm. Common bile duct: Diameter: 3.6 mm Liver: No focal lesion identified. Within normal limits in parenchymal echogenicity. Portal vein is patent on color Doppler imaging with normal direction of blood flow towards the liver. Other: Small right pleural effusion. 3.9 cm right upper pole renal cyst. IMPRESSION: Residual gallbladder wall thickening along the interface with the liver of 7.8 mm. 7 mm nonobstructive gallstone.  Small right pleural effusion. Electronically Signed   By: Ted Mcalpine M.D.   On: 12/16/2019 15:38    EKG: Independently reviewed.   Assessment/Plan Active Problems:   Pleural effusion  Right pleural effusion, rule out empyema, Zosyn was started in the ED, will continue Zosyn.  IR contacted for thoracentesis, cytology and culture to rule out empyema.  Hypertension, continue home meds.  Lipidemia, on statin.  Hypothyroidism, continue Synthroid Synthroid.    DVT prophylaxis: Heparin subcu Code Status: Full code Family Communication: Discussed with patient husband Disposition Plan: Home Consults called: IR Admission status: MedSurg   Emeline General MD [facility] Pager [phone]  If 7PM-7AM, please contact night-coverage [email or web address] Password [facility]  12/16/2019, 6:59 PM

## 2019-12-16 NOTE — ED Notes (Signed)
Patient is being discharged from the Urgent [facility] Center and sent to the Emergency Department via wheelchair by staff. Per Dr Tracie Harrier, patient is stable but in need of higher level of care due to post surgical complications. Patient is aware and verbalizes understanding of plan of care. There were no vitals filed for this visit.

## 2019-12-16 NOTE — ED Notes (Signed)
Patient transported to CT 

## 2019-12-16 NOTE — ED Notes (Signed)
Patient having right upper quadrant abdominal pain for 2-3 days.  Patient has had a complicated post-surgical course after going in for gallbladder surgery, but did not have gallbladder removed and recently had a drain removed. "not safe to remove" gallbladder per patient.  After drain removed, pain free for 12 hours, pain then reoccurred and has increased since onset.  Spoke to dr hagler and patient sent to ED with family-both agreeable to plan

## 2019-12-16 NOTE — ED Notes (Signed)
Pt had a gall bladder drain placed 10/04/2019-- removed past Wednesday- now feels distended, abd pain , has dressing to right side area-- clean and dry. - dressing removed for assessment- clean dry no redness no drainage.

## 2019-12-17 DIAGNOSIS — I10 Essential (primary) hypertension: Secondary | ICD-10-CM

## 2019-12-17 DIAGNOSIS — J189 Pneumonia, unspecified organism: Secondary | ICD-10-CM

## 2019-12-17 LAB — CBC
HCT: 29.1 % — ABNORMAL LOW (ref 36.0–46.0)
Hemoglobin: 9.3 g/dL — ABNORMAL LOW (ref 12.0–15.0)
MCH: 26.6 pg (ref 26.0–34.0)
MCHC: 32 g/dL (ref 30.0–36.0)
MCV: 83.4 fL (ref 80.0–100.0)
Platelets: 147 10*3/uL — ABNORMAL LOW (ref 150–400)
RBC: 3.49 MIL/uL — ABNORMAL LOW (ref 3.87–5.11)
RDW: 14.7 % (ref 11.5–15.5)
WBC: 5.3 10*3/uL (ref 4.0–10.5)
nRBC: 0 % (ref 0.0–0.2)

## 2019-12-17 LAB — SARS CORONAVIRUS 2 (TAT 6-24 HRS): SARS Coronavirus 2: NEGATIVE

## 2019-12-17 LAB — BASIC METABOLIC PANEL
Anion gap: 8 (ref 5–15)
BUN: 8 mg/dL (ref 8–23)
CO2: 26 mmol/L (ref 22–32)
Calcium: 8 mg/dL — ABNORMAL LOW (ref 8.9–10.3)
Chloride: 103 mmol/L (ref 98–111)
Creatinine, Ser: 0.88 mg/dL (ref 0.44–1.00)
GFR calc Af Amer: >60 mL/min (ref >60)
GFR calc non Af Amer: >60 mL/min (ref >60)
Glucose, Bld: 127 mg/dL — ABNORMAL HIGH (ref 70–99)
Potassium: 3.6 mmol/L (ref 3.5–5.1)
Sodium: 137 mmol/L (ref 135–145)

## 2019-12-17 LAB — PROCALCITONIN: Procalcitonin: <0.1 ng/mL

## 2019-12-17 MED ORDER — OXYCODONE HCL 5 MG PO TABS
10.0000 mg | ORAL_TABLET | ORAL | Status: DC | PRN
  Administered 2019-12-17 – 2019-12-25 (×27): 10 mg via ORAL
  Filled 2019-12-17 (×28): qty 2

## 2019-12-17 NOTE — Progress Notes (Signed)
PROGRESS NOTE    Suzanne Harrison  VZC:[id number]  DOB: 09-Sep-1951  DOA: 12/16/2019 PCP: Maurice Small, MD Outpatient Specialists:   Hospital course:  Patient is a 70 year old female with PMH significant for HTN, HL who was recently discharged 2 weeks ago after treatment for acute cholecystitis.  Initial laparoscopic cholecystectomy was unsuccessful due to likely adherence of gallbladder to bowel.  She was treated instead with percutaneous cholecystostomy tube placed by IR and IV antibiotics.  She did well clinically and was discharged home.  4 days prior to admission cholecystostomy tube was discontinued electively by IR.  The following day patient developed right-sided pleuritic chest pain which progressed and patient presented to ED.  CT scan in ED reveals a loculated pleural effusion on the right side with some question whether the fluid is in communication subdiaphragmatically with possible biliary leak.   Subjective:  Patient does not necessarily feel any better from yesterday.  Her biggest concern is pain management and inability to sleep due to right-sided flank/chest pain.  Denies fevers or chills either here or at home.   Objective: Vitals:   12/16/19 2001 12/16/19 2130 12/17/19 0502 12/17/19 1333  BP: (!) 125/51 112/62 112/63 (!) 116/59  Pulse: 70 82 64 71  Resp:  18 15 18   Temp: 98.1 F (36.7 C) 100.1 F (37.8 C) 99.6 F (37.6 C) 99.1 F (37.3 C)  TempSrc: Oral Oral Oral Oral  SpO2:  95% 95% 96%  Weight:      Height:        Intake/Output Summary (Last 24 hours) at 12/17/2019 1639 Last data filed at 12/17/2019 1500 Gross per 24 hour  Intake 4248.1 ml  Output 2475 ml  Net 1773.1 ml   Filed Weights   12/16/19 1357  Weight: 79.8 kg     Assessment & Plan:   Right pleural effusion Right pleural effusion may represent parapneumonic effusion/empyema secondary to pneumonia versus possible biliary leak with communication to lung. Patient herself denies  fevers chills cough or shortness of breath so I think this is more likely secondary to cholecystostomy instrumentation status post drain removal 4 days ago. Gust with IR, they will take patient in and had a sample of fluid to send off. Continue Zosyn day #2 although patient is afebrile with no leukocytosis. Will order pro calcitonin, if negative can discontinue Zosyn.  HTN Continue atenolol and lisinopril  Anxiety and depression Continue citalopram  HL Continue statin  Hypothyroidism Continue Synthroid   DVT prophylaxis: On subcu heparin Code Status: Full Family Communication: No need to contact family per patient Disposition Plan: Home   Consultants:  IR  Procedures:  Thoracentesis to be done today  Antimicrobials:  Zosyn day #2   Exam:  General: Patient lying in bed complaining of pain, somewhat sleepy. Eyes: sclera anicteric, conjuctiva mild injection bilaterally CVS: S1-S2 no murmur rubs or gallops Respiratory: Shallow respirations with some splinting, decreased breath sounds at the right base without rhonchi. GI: NABS, soft, NT, Murphy sign is negative.   LE: No edema. No cyanosis Neuro: A/O x 3, Moving all extremities equally with normal strength, CN 3-12 intact, grossly nonfocal.    Data Reviewed: Basic Metabolic Panel: Recent Labs  Lab 12/16/19 1400 12/16/19 1605 12/17/19 0307  NA 133* 133* 137  K 3.8 3.6 3.6  CL 99 96* 103  CO2 26  --  26  GLUCOSE 108* 101* 127*  BUN 11 12 8   CREATININE 0.97 1.00 0.88  CALCIUM 8.6*  --  8.0*  Liver Function Tests: Recent Labs  Lab 12/16/19 1400  AST 14*  ALT 11  ALKPHOS 116  BILITOT 1.1  PROT 6.6  ALBUMIN 3.0*   Recent Labs  Lab 12/16/19 1400  LIPASE 16   No results for input(s): AMMONIA in the last 168 hours. CBC: Recent Labs  Lab 12/16/19 1400 12/16/19 1605 12/17/19 0307  WBC 7.7  --  5.3  NEUTROABS 5.2  --   --   HGB 10.9* 11.6* 9.3*  HCT 34.5* 34.0* 29.1*  MCV 84.6  --  83.4    PLT 150  --  147*   Cardiac Enzymes: No results for input(s): CKTOTAL, CKMB, CKMBINDEX, TROPONINI in the last 168 hours. BNP (last 3 results) No results for input(s): PROBNP in the last 8760 hours. CBG: No results for input(s): GLUCAP in the last 168 hours.  Recent Results (from the past 240 hour(s))  SARS CORONAVIRUS 2 (TAT 6-24 HRS) Nasopharyngeal Nasopharyngeal Swab     Status: None   Collection Time: 12/16/19  7:45 PM   Specimen: Nasopharyngeal Swab  Result Value Ref Range Status   SARS Coronavirus 2 NEGATIVE NEGATIVE Final    Comment: (NOTE) SARS-CoV-2 target nucleic acids are NOT DETECTED. The SARS-CoV-2 RNA is generally detectable in upper and lower respiratory specimens during the acute phase of infection. Negative results do not preclude SARS-CoV-2 infection, do not rule out co-infections with other pathogens, and should not be used as the sole basis for treatment or other patient management decisions. Negative results must be combined with clinical observations, patient history, and epidemiological information. The expected result is Negative. Fact Sheet for Patients: HairSlick.no Fact Sheet for Healthcare Providers: quierodirigir.com This test is not yet approved or cleared by the [place] FDA and  has been authorized for detection and/or diagnosis of SARS-CoV-2 by FDA under an Emergency Use Authorization (EUA). This EUA will remain  in effect (meaning this test can be used) for the duration of the COVID-19 declaration under Section 56 4(b)(1) of the Act, 21 U.S.C. section 360bbb-3(b)(1), unless the authorization is terminated or revoked sooner. Performed at [facility], [address], [place], [place] [address]       Studies: CT ABDOMEN PELVIS W CONTRAST  Result Date: 12/16/2019 CLINICAL DATA:  Right upper quadrant abdominal pain, complicated postsurgical course with reported gallbladder  surgery EXAM: CT ABDOMEN AND PELVIS WITH CONTRAST TECHNIQUE: Multidetector CT imaging of the abdomen and pelvis was performed using the standard protocol following bolus administration of intravenous contrast. CONTRAST:  OMNIPAQUE IOHEXOL 300 MG/ML  SOLN COMPARISON:  Percutaneous CT-guided imaging 10/04/2019, MR abdomen 10/02/2019, CT abdomen 10/01/2019 FINDINGS: Lower chest: Loculated right pleural effusion with some adjacent atelectatic changes in the lungs as well as more peripheral ground-glass and reticulonodular opacity in the right lower lung. Question some mild peripheral enhancement and pleural thickening. More bandlike areas of opacity compatible with atelectasis and/or scarring. Hepatobiliary: There are surgical clips in the gallbladder fossa likely reflecting at least partial cholecystectomy as well as a rib hypoattenuating tract through the liver parenchyma extending laterally compatible with prior placement of a cholecystostomy tube. Trace amount of fluid is seen along the lateral aspect of the liver surface extending towards the left lateral abdominal wall with some faint residual intercostal thickening at the level of the ninth interspace on the right (3/23) no other focal liver abnormality is seen. No acute pericholecystic inflammation is seen. No gallbladder wall thickening. No visible calcified intraluminal or intraductal gallstones. No biliary ductal dilatation.  Pancreas: Fatty replacement of the pancreas. No pancreatic ductal dilatation or surrounding inflammatory changes. Spleen: Normal in size without focal abnormality. Small accessory splenules. Adrenals/Urinary Tract: Normal adrenal glands. Symmetric enhancement and excretion of the kidneys. Multiple fluid attenuation cysts are again seen in both kidneys, largest in the lower pole left kidney with some thin peripheral mural calcification (Bosniak II). Additional fat attenuation lesion in the upper pole left kidney compatible with  angiomyolipoma. Bilateral cortical cysts are noted. Left extrarenal pelvis is noted. No worrisome renal lesions, obstructive urolithiasis or hydronephrosis. Bladder is unremarkable. Stomach/Bowel: Distal esophagus, stomach and duodenal sweep are unremarkable. No small bowel wall thickening or dilatation. No evidence of obstruction. Appendix not well visualized. No focal pericecal inflammation is suggest occult appendicitis. Question some mild edematous mural thickening of the distal colon from the splenic flexure to the level of the rectum. Scattered colonic diverticula without focal pericolonic inflammation to suggest diverticulitis. Vascular/Lymphatic: Minimal atherosclerotic plaque within the normal caliber aorta. Reactive lymph nodes seen in the lower mediastinum near the GE junction. Few reactive nodes in the abdomen as well. No pathologically enlarged nodes. Reproductive: Normal appearance of the uterus and adnexal structures. Other: No free fluid or free air in the abdomen or pelvis. Perihepatic fluid and cholecystostomy tract, as above. Postsurgical changes of the anterior abdominal wall. Musculoskeletal: Extensive thoracolumbar fusion and bilateral total hip arthroplasties with significant metallic streak artifact. No acute hardware complication is seen. Prior L3 compression deformity is similar comparison. Underlying multilevel degenerative changes are present. IMPRESSION: 1. Surgical clips in the gallbladder fossa likely reflecting at least partial cholecystectomy as well as a rib hypoattenuating tract extending laterally through the liver parenchyma compatible with prior placement of a cholecystostomy tube. Trace amount of fluid along the lateral aspect of the liver surface extending towards the left lateral abdominal wall with some faint residual intercostal thickening at the level of the ninth interspace on the right. No CT evidence of acute pericholecystic inflammation. 2. Loculated right pleural  effusion with some questionable pleural thickening and enhancement worrisome for empyema. While this could reflect a primary lung process such as a developing pneumonia with parapneumonic effusion, given some residual thickening adjacent the diaphragm could reflect traversal of the lateral-most diaphragmatic recess with placement and subsequent removal of the cholecystostomy tube. Direct sampling could better ascertain the origin of this fluid. 3. Question some mild edematous mural thickening of the distal colon from the splenic flexure to the level of the rectum. Findings could represent a mild colitis. 4. Colonic diverticula without evidence of acute diverticulitis. 5. 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 6. Extensive thoracolumbar fusion and bilateral total hip arthroplasties with significant metallic streak artifact. No acute hardware complication. 7.  Aortic Atherosclerosis (ICD10-I70.0). These results were called by telephone at the time of interpretation on 12/16/2019 at 5:22 pm to provider [facility] Suzanne , who verbally acknowledged these results. Electronically Signed   By: Kreg Shropshire M.D.   On: 12/16/2019 17:23   US Abdomen Limited RUQ  Result Date: 12/16/2019 CLINICAL DATA:  Post removal of gallbladder drain. EXAM: ULTRASOUND ABDOMEN LIMITED RIGHT UPPER QUADRANT COMPARISON:  October 02, 2019 FINDINGS: Gallbladder: The gallbladder is normally distended. There is wall thickening of 7.8 mm. The sonographic Murphy's sign was reported as negative. Gallbladder calculus measures 7 mm. Common bile duct: Diameter: 3.6 mm Liver: No focal lesion identified. Within normal limits in parenchymal echogenicity. Portal vein is patent on color Doppler imaging with normal direction of blood flow  towards the liver. Other: Small right pleural effusion. 3.9 cm right upper pole renal cyst. IMPRESSION: Residual gallbladder wall thickening along the interface with the liver of 7.8 mm. 7  mm nonobstructive gallstone. Small right pleural effusion. Electronically Signed   By: Fidela Salisbury M.D.   On: 12/16/2019 15:38     Scheduled Meds: . aspirin EC  81 mg Oral QHS  . atenolol  50 mg Oral Daily  . citalopram  20 mg Oral Daily  . cycloSPORINE  1 drop Both Eyes BID  . heparin  5,000 Units Subcutaneous Q8H  . levothyroxine  50 mcg Oral Daily  . lisinopril  5 mg Oral Daily  . loratadine  10 mg Oral Daily  . multivitamin with minerals  1 tablet Oral QHS  . oxybutynin  10 mg Oral Daily  . pantoprazole  40 mg Oral BID  . pregabalin  150 mg Oral BID  . rosuvastatin  10 mg Oral QHS   Continuous Infusions: . sodium chloride 250 mL/hr at 12/17/19 0649  . piperacillin-tazobactam (ZOSYN)  IV 3.375 g (12/17/19 1432)    Active Problems:   Loculated pleural effusion     Dewaine Oats Derek Jack, MD, FACP, [facility]. [facility]  If 7PM-7AM, please contact night-coverage [email or web address] Password [email or web address] 12/17/2019, 4:39 PM    LOS: 1 day

## 2019-12-18 ENCOUNTER — Inpatient Hospital Stay (HOSPITAL_COMMUNITY): Payer: 59

## 2019-12-18 HISTORY — PX: IR THORACENTESIS ASP PLEURAL SPACE W/IMG GUIDE: IMG5380

## 2019-12-18 LAB — BODY FLUID CELL COUNT WITH DIFFERENTIAL
Eos, Fluid: 12 %
Lymphs, Fluid: 17 %
Monocyte-Macrophage-Serous Fluid: 11 % — ABNORMAL LOW (ref 50–90)
Neutrophil Count, Fluid: 60 % — ABNORMAL HIGH (ref 0–25)
Total Nucleated Cell Count, Fluid: 2880 cu mm — ABNORMAL HIGH (ref 0–1000)

## 2019-12-18 LAB — CBC
HCT: 30 % — ABNORMAL LOW (ref 36.0–46.0)
Hemoglobin: 9.3 g/dL — ABNORMAL LOW (ref 12.0–15.0)
MCH: 26.3 pg (ref 26.0–34.0)
MCHC: 31 g/dL (ref 30.0–36.0)
MCV: 84.7 fL (ref 80.0–100.0)
Platelets: 155 10*3/uL (ref 150–400)
RBC: 3.54 MIL/uL — ABNORMAL LOW (ref 3.87–5.11)
RDW: 14.9 % (ref 11.5–15.5)
WBC: 4.2 10*3/uL (ref 4.0–10.5)
nRBC: 0 % (ref 0.0–0.2)

## 2019-12-18 LAB — BASIC METABOLIC PANEL
Anion gap: 7 (ref 5–15)
BUN: 5 mg/dL — ABNORMAL LOW (ref 8–23)
CO2: 26 mmol/L (ref 22–32)
Calcium: 8.3 mg/dL — ABNORMAL LOW (ref 8.9–10.3)
Chloride: 107 mmol/L (ref 98–111)
Creatinine, Ser: 0.75 mg/dL (ref 0.44–1.00)
GFR calc Af Amer: >60 mL/min (ref >60)
GFR calc non Af Amer: >60 mL/min (ref >60)
Glucose, Bld: 110 mg/dL — ABNORMAL HIGH (ref 70–99)
Potassium: 3.5 mmol/L (ref 3.5–5.1)
Sodium: 140 mmol/L (ref 135–145)

## 2019-12-18 LAB — GRAM STAIN

## 2019-12-18 MED ORDER — LIDOCAINE HCL 1 % IJ SOLN
INTRAMUSCULAR | Status: AC
  Filled 2019-12-18: qty 20

## 2019-12-18 MED ORDER — LIDOCAINE HCL (PF) 1 % IJ SOLN
INTRAMUSCULAR | Status: DC | PRN
  Administered 2019-12-18: 15 mL

## 2019-12-18 MED ORDER — HEPARIN SODIUM (PORCINE) 5000 UNIT/ML IJ SOLN
5000.0000 [IU] | Freq: Three times a day (TID) | INTRAMUSCULAR | Status: DC
  Administered 2019-12-18 – 2019-12-25 (×20): 5000 [IU] via SUBCUTANEOUS
  Filled 2019-12-18 (×21): qty 1

## 2019-12-18 NOTE — Progress Notes (Signed)
PROGRESS NOTE    [place] Farrey  NUU:[id number] DOB: 11/30/1951 DOA: 12/16/2019 PCP: Maurice Small, MD  Brief Narrative:  Patient is a 69 year old female with PMH significant for HTN, HL who was recently discharged 2 weeks ago after treatment for acute cholecystitis.  Initial laparoscopic cholecystectomy was unsuccessful due to likely adherence of gallbladder to bowel.  She was treated instead with percutaneous cholecystostomy tube placed by IR and IV antibiotics.  She did well clinically and was discharged home.  4 days prior to admission cholecystostomy tube was discontinued electively by IR.  The following day patient developed right-sided pleuritic chest pain which progressed and patient presented to ED.  CT scan in ED reveals a loculated pleural effusion on the right side with some question whether the fluid is in communication subdiaphragmatically with possible biliary leak.  12/18/19: Thoracentesis not done yet and IR has not evaluated. Unclear if they were truly contacted. Contacted IR today for thoracentesis and they will try. Unclear to them if there is enough fluid to sample.  Assessment & Plan:   Active Problems:   Loculated pleural effusion  Right pleural effusion -Empyema versus bile leak -Needs thoracentesis to evaluate source -currently on zosyn and will continue for today although procalcitonin was <0.1 -consulted IR today as no one has evaluated patient to make sure they were notified which it appears that they were not consulted -patient on heparin [place] -if thoracentesis drainage not purulent stop antibiotics -if bile leak contact general surgery for consultation  HTN Continue atenolol and lisinopril  Anxiety and depression Continue citalopram  HL Continue statin  Hypothyroidism Continue Synthroid  DVT prophylaxis: heparin Code Status: Full Family Communication:patient only Disposition Plan: pending thoracentesis  Consultants:    IR  Procedures:   None yet  Antimicrobials:  Zosyn start date 12/16/19  Subjective: Still having pain with breathing and moving right flank and right chest wall. Not sleeping well. Denies new problems.   Objective: Vitals:   12/17/19 0502 12/17/19 1333 12/17/19 2036 12/18/19 0451  BP: 112/63 (!) 116/59 (!) 146/75 (!) 158/82  Pulse: 64 71 63 71  Resp: 15 18 15 15   Temp: 99.6 F (37.6 C) 99.1 F (37.3 C) 98 F (36.7 C) 98.2 F (36.8 C)  TempSrc: Oral Oral Oral Oral  SpO2: 95% 96% 98% 97%  Weight:      Height:        Intake/Output Summary (Last 24 hours) at 12/18/2019 0941 Last data filed at 12/18/2019 0924 Gross per 24 hour  Intake 4286.08 ml  Output 3250 ml  Net 1036.08 ml   Filed Weights   12/16/19 1357  Weight: 79.8 kg    Examination:  General exam: Appears calm and comfortable  Respiratory system: Clear to auscultation. Respiratory effort normal. Cardiovascular system: S1 & S2 heard, RRR. No JVD, murmurs, rubs, gallops or clicks. No pedal edema. Gastrointestinal system: Abdomen is nondistended, soft and nontender. No organomegaly or masses felt. Normal bowel sounds heard. Central nervous system: Alert and oriented. No focal neurological deficits. Extremities: Symmetric 5 x 5 power. Skin: No rashes, lesions or ulcers Psychiatry: Judgement and insight appear normal. Mood & affect appropriate.     Data Reviewed: I have personally reviewed following labs and imaging studies  CBC: Recent Labs  Lab 12/16/19 1400 12/16/19 1605 12/17/19 0307 12/18/19 0221  WBC 7.7  --  5.3 4.2  NEUTROABS 5.2  --   --   --   HGB 10.9* 11.6* 9.3* 9.3*  HCT 34.5* 34.0* 29.1* 30.0*  MCV 84.6  --  83.4 84.7  PLT 150  --  147* 155   Basic Metabolic Panel: Recent Labs  Lab 12/16/19 1400 12/16/19 1605 12/17/19 0307 12/18/19 0221  NA 133* 133* 137 140  K 3.8 3.6 3.6 3.5  CL 99 96* 103 107  CO2 26  --  26 26  GLUCOSE 108* 101* 127* 110*  BUN 11 12 8  5*  CREATININE  0.97 1.00 0.88 0.75  CALCIUM 8.6*  --  8.0* 8.3*   GFR: Estimated Creatinine Clearance: 66.8 mL/min (by C-G formula based on SCr of 0.75 mg/dL). Liver Function Tests: Recent Labs  Lab 12/16/19 1400  AST 14*  ALT 11  ALKPHOS 116  BILITOT 1.1  PROT 6.6  ALBUMIN 3.0*   Recent Labs  Lab 12/16/19 1400  LIPASE 16   No results for input(s): AMMONIA in the last 168 hours. Coagulation Profile: No results for input(s): INR, PROTIME in the last 168 hours. Cardiac Enzymes: No results for input(s): CKTOTAL, CKMB, CKMBINDEX, TROPONINI in the last 168 hours. BNP (last 3 results) No results for input(s): PROBNP in the last 8760 hours. HbA1C: No results for input(s): HGBA1C in the last 72 hours. CBG: No results for input(s): GLUCAP in the last 168 hours. Lipid Profile: No results for input(s): CHOL, HDL, LDLCALC, TRIG, CHOLHDL, LDLDIRECT in the last 72 hours. Thyroid Function Tests: No results for input(s): TSH, T4TOTAL, FREET4, T3FREE, THYROIDAB in the last 72 hours. Anemia Panel: No results for input(s): VITAMINB12, FOLATE, FERRITIN, TIBC, IRON, RETICCTPCT in the last 72 hours. Sepsis Labs: Recent Labs  Lab 12/17/19 0307  PROCALCITON <0.10    Recent Results (from the past 240 hour(s))  SARS CORONAVIRUS 2 (TAT 6-24 HRS) Nasopharyngeal Nasopharyngeal Swab     Status: None   Collection Time: 12/16/19  7:45 PM   Specimen: Nasopharyngeal Swab  Result Value Ref Range Status   SARS Coronavirus 2 NEGATIVE NEGATIVE Final    Comment: (NOTE) SARS-CoV-2 target nucleic acids are NOT DETECTED. The SARS-CoV-2 RNA is generally detectable in upper and lower respiratory specimens during the acute phase of infection. Negative results do not preclude SARS-CoV-2 infection, do not rule out co-infections with other pathogens, and should not be used as the sole basis for treatment or other patient management decisions. Negative results must be combined with clinical observations, patient history,  and epidemiological information. The expected result is Negative. Fact Sheet for Patients: 12/18/19 Fact Sheet for Healthcare Providers: HairSlick.no This test is not yet approved or cleared by the quierodirigir.com FDA and  has been authorized for detection and/or diagnosis of SARS-CoV-2 by FDA under an Emergency Use Authorization (EUA). This EUA will remain  in effect (meaning this test can be used) for the duration of the COVID-19 declaration under Section 56 4(b)(1) of the Act, 21 U.S.C. section 360bbb-3(b)(1), unless the authorization is terminated or revoked sooner. Performed at [facility], [address] [place], [place]          Radiology Studies: CT ABDOMEN PELVIS W CONTRAST  Result Date: 12/16/2019 CLINICAL DATA:  Right upper quadrant abdominal pain, complicated postsurgical course with reported gallbladder surgery EXAM: CT ABDOMEN AND PELVIS WITH CONTRAST TECHNIQUE: Multidetector CT imaging of the abdomen and pelvis was performed using the standard protocol following bolus administration of intravenous contrast. CONTRAST:  12/18/2019 OMNIPAQUE IOHEXOL 300 MG/ML  SOLN COMPARISON:  Percutaneous CT-guided imaging 10/04/2019, MR abdomen 10/02/2019, CT abdomen 10/01/2019 FINDINGS: Lower chest: Loculated right pleural effusion with some adjacent  atelectatic changes in the lungs as well as more peripheral ground-glass and reticulonodular opacity in the right lower lung. Question some mild peripheral enhancement and pleural thickening. More bandlike areas of opacity compatible with atelectasis and/or scarring. Hepatobiliary: There are surgical clips in the gallbladder fossa likely reflecting at least partial cholecystectomy as well as a rib hypoattenuating tract through the liver parenchyma extending laterally compatible with prior placement of a cholecystostomy tube. Trace amount of fluid is seen along the lateral  aspect of the liver surface extending towards the left lateral abdominal wall with some faint residual intercostal thickening at the level of the ninth interspace on the right (3/23) no other focal liver abnormality is seen. No acute pericholecystic inflammation is seen. No gallbladder wall thickening. No visible calcified intraluminal or intraductal gallstones. No biliary ductal dilatation. Pancreas: Fatty replacement of the pancreas. No pancreatic ductal dilatation or surrounding inflammatory changes. Spleen: Normal in size without focal abnormality. Small accessory splenules. Adrenals/Urinary Tract: Normal adrenal glands. Symmetric enhancement and excretion of the kidneys. Multiple fluid attenuation cysts are again seen in both kidneys, largest in the lower pole left kidney with some thin peripheral mural calcification (Bosniak II). Additional fat attenuation lesion in the upper pole left kidney compatible with angiomyolipoma. Bilateral cortical cysts are noted. Left extrarenal pelvis is noted. No worrisome renal lesions, obstructive urolithiasis or hydronephrosis. Bladder is unremarkable. Stomach/Bowel: Distal esophagus, stomach and duodenal sweep are unremarkable. No small bowel wall thickening or dilatation. No evidence of obstruction. Appendix not well visualized. No focal pericecal inflammation is suggest occult appendicitis. Question some mild edematous mural thickening of the distal colon from the splenic flexure to the level of the rectum. Scattered colonic diverticula without focal pericolonic inflammation to suggest diverticulitis. Vascular/Lymphatic: Minimal atherosclerotic plaque within the normal caliber aorta. Reactive lymph nodes seen in the lower mediastinum near the GE junction. Few reactive nodes in the abdomen as well. No pathologically enlarged nodes. Reproductive: Normal appearance of the uterus and adnexal structures. Other: No free fluid or free air in the abdomen or pelvis. Perihepatic  fluid and cholecystostomy tract, as above. Postsurgical changes of the anterior abdominal wall. Musculoskeletal: Extensive thoracolumbar fusion and bilateral total hip arthroplasties with significant metallic streak artifact. No acute hardware complication is seen. Prior L3 compression deformity is similar comparison. Underlying multilevel degenerative changes are present. IMPRESSION: 1. Surgical clips in the gallbladder fossa likely reflecting at least partial cholecystectomy as well as a rib hypoattenuating tract extending laterally through the liver parenchyma compatible with prior placement of a cholecystostomy tube. Trace amount of fluid along the lateral aspect of the liver surface extending towards the left lateral abdominal wall with some faint residual intercostal thickening at the level of the ninth interspace on the right. No CT evidence of acute pericholecystic inflammation. 2. Loculated right pleural effusion with some questionable pleural thickening and enhancement worrisome for empyema. While this could reflect a primary lung process such as a developing pneumonia with parapneumonic effusion, given some residual thickening adjacent the diaphragm could reflect traversal of the lateral-most diaphragmatic recess with placement and subsequent removal of the cholecystostomy tube. Direct sampling could better ascertain the origin of this fluid. 3. Question some mild edematous mural thickening of the distal colon from the splenic flexure to the level of the rectum. Findings could represent a mild colitis. 4. Colonic diverticula without evidence of acute diverticulitis. 5. 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 6. Extensive thoracolumbar fusion and bilateral total hip arthroplasties with significant metallic  streak artifact. No acute hardware complication. 7.  Aortic Atherosclerosis (ICD10-I70.0). These results were called by telephone at the time of interpretation on  12/16/2019 at 5:22 pm to provider [facility] , who verbally acknowledged these results. Electronically Signed   By: Kreg Shropshire M.D.   On: 12/16/2019 17:23   US Abdomen Limited RUQ  Result Date: 12/16/2019 CLINICAL DATA:  Post removal of gallbladder drain. EXAM: ULTRASOUND ABDOMEN LIMITED RIGHT UPPER QUADRANT COMPARISON:  October 02, 2019 FINDINGS: Gallbladder: The gallbladder is normally distended. There is wall thickening of 7.8 mm. The sonographic Murphy's sign was reported as negative. Gallbladder calculus measures 7 mm. Common bile duct: Diameter: 3.6 mm Liver: No focal lesion identified. Within normal limits in parenchymal echogenicity. Portal vein is patent on color Doppler imaging with normal direction of blood flow towards the liver. Other: Small right pleural effusion. 3.9 cm right upper pole renal cyst. IMPRESSION: Residual gallbladder wall thickening along the interface with the liver of 7.8 mm. 7 mm nonobstructive gallstone. Small right pleural effusion. Electronically Signed   By: Ted Mcalpine M.D.   On: 12/16/2019 15:38        Scheduled Meds: . aspirin EC  81 mg Oral QHS  . atenolol  50 mg Oral Daily  . citalopram  20 mg Oral Daily  . cycloSPORINE  1 drop Both Eyes BID  . heparin  5,000 Units Subcutaneous Q8H  . levothyroxine  50 mcg Oral Daily  . lisinopril  5 mg Oral Daily  . loratadine  10 mg Oral Daily  . multivitamin with minerals  1 tablet Oral QHS  . oxybutynin  10 mg Oral Daily  . pantoprazole  40 mg Oral BID  . pregabalin  150 mg Oral BID  . rosuvastatin  10 mg Oral QHS   Continuous Infusions: . sodium chloride 125 mL/hr at 12/18/19 0600  . piperacillin-tazobactam (ZOSYN)  IV 3.375 g (12/18/19 0718)     LOS: 2 days   Time spent: 25  Myrlene Broker, MD [facility] Pager [phone]  If 7PM-7AM, please contact night-coverage [email or web address] Password [facility] 12/18/2019, 9:41 AM

## 2019-12-18 NOTE — Procedures (Signed)
PROCEDURE SUMMARY:  Successful US guided right thoracentesis. Yielded 800 mL of amber fluid. Patient tolerated procedure well. No immediate complications. EBL = trace  Specimen was sent for labs.  Post procedure chest X-ray reveals no pneumothorax  Nour Rodrigues S Jere Bostrom PA-C 12/18/2019 4:13 PM

## 2019-12-19 LAB — CBC
HCT: 30.7 % — ABNORMAL LOW (ref 36.0–46.0)
Hemoglobin: 9.9 g/dL — ABNORMAL LOW (ref 12.0–15.0)
MCH: 26.6 pg (ref 26.0–34.0)
MCHC: 32.2 g/dL (ref 30.0–36.0)
MCV: 82.5 fL (ref 80.0–100.0)
Platelets: 190 10*3/uL (ref 150–400)
RBC: 3.72 MIL/uL — ABNORMAL LOW (ref 3.87–5.11)
RDW: 14.5 % (ref 11.5–15.5)
WBC: 4.6 10*3/uL (ref 4.0–10.5)
nRBC: 0 % (ref 0.0–0.2)

## 2019-12-19 LAB — COMPREHENSIVE METABOLIC PANEL
ALT: 11 U/L (ref 0–44)
AST: 12 U/L — ABNORMAL LOW (ref 15–41)
Albumin: 2.3 g/dL — ABNORMAL LOW (ref 3.5–5.0)
Alkaline Phosphatase: 194 U/L — ABNORMAL HIGH (ref 38–126)
Anion gap: 6 (ref 5–15)
BUN: <5 mg/dL — ABNORMAL LOW (ref 8–23)
CO2: 30 mmol/L (ref 22–32)
Calcium: 8.6 mg/dL — ABNORMAL LOW (ref 8.9–10.3)
Chloride: 105 mmol/L (ref 98–111)
Creatinine, Ser: 0.9 mg/dL (ref 0.44–1.00)
GFR calc Af Amer: >60 mL/min (ref >60)
GFR calc non Af Amer: >60 mL/min (ref >60)
Glucose, Bld: 95 mg/dL (ref 70–99)
Potassium: 3.6 mmol/L (ref 3.5–5.1)
Sodium: 141 mmol/L (ref 135–145)
Total Bilirubin: 0.7 mg/dL (ref 0.3–1.2)
Total Protein: 5.6 g/dL — ABNORMAL LOW (ref 6.5–8.1)

## 2019-12-19 MED ORDER — DOXYCYCLINE HYCLATE 100 MG PO TABS
100.0000 mg | ORAL_TABLET | Freq: Two times a day (BID) | ORAL | Status: DC
  Administered 2019-12-19 – 2019-12-21 (×5): 100 mg via ORAL
  Filled 2019-12-19 (×5): qty 1

## 2019-12-19 MED ORDER — IPRATROPIUM-ALBUTEROL 0.5-2.5 (3) MG/3ML IN SOLN: 3.0000 mL | RESPIRATORY_TRACT | Status: DC | PRN

## 2019-12-19 NOTE — Evaluation (Signed)
Physical Therapy Evaluation Patient Details Name: Suzanne Harrison MRN: [id number] DOB: [birth year] Today's Date: 12/19/2019   History of Present Illness  Patient is a 69 year old female with PMH significant for HTN, HL who was recently discharged 2 weeks ago after treatment for acute cholecystitis. Pt returns with R quadrant pain and loculated pleural effusion. Thoracentesis performed 1/18.  Clinical Impression  Pt admitted with above diagnosis. Pt currently ambulating at supervision level with SPC, maintaining flexed posture due to RUQ discomfort. Will follow to ensure she continues to mobilize and work on posture.  Pt currently with functional limitations due to the deficits listed below (see PT Problem List). Pt will benefit from skilled PT to increase their independence and safety with mobility to allow discharge to the venue listed below.     SpO2 94% on RA    Follow Up Recommendations No PT follow up    Equipment Recommendations  None recommended by PT    Recommendations for Other Services       Precautions / Restrictions Precautions Precautions: None Restrictions Weight Bearing Restrictions: No      Mobility  Bed Mobility Overal bed mobility: Needs Assistance Bed Mobility: Rolling;Sidelying to Sit Rolling: Modified independent (Device/Increase time) Sidelying to sit: Supervision       General bed mobility comments: slow, painful mvmt  Transfers Overall transfer level: Needs assistance Equipment used: Rolling walker (2 wheeled);Straight cane;None Transfers: Sit to/from Stand Sit to Stand: Supervision         General transfer comment: supervision for safety from bed, chair, and toilet. Pt used RW first time up, then cane once it was obtained from closet  Ambulation/Gait Ambulation/Gait assistance: Supervision Gait Distance (Feet): 500 Feet Assistive device: Straight cane Gait Pattern/deviations: Antalgic;Trunk flexed Gait velocity: decreased Gait  velocity interpretation: 1.31 - 2.62 ft/sec, indicative of limited community ambulator General Gait Details: pt reported pain when standing erect, kept trunk flexed during gait. No LOB, safe with use of cane  Stairs            Wheelchair Mobility    Modified Rankin (Stroke Patients Only)       Balance Overall balance assessment: Mild deficits observed, not formally tested                                           Pertinent Vitals/Pain Pain Assessment: 0-10 Pain Score: 6  Pain Location: RUQ with inhale Pain Descriptors / Indicators: Sharp Pain Intervention(s): Limited activity within patient's tolerance;Monitored during session;Premedicated before session    Home Living Family/patient expects to be discharged to:: Private residence Living Arrangements: Spouse/significant other Available Help at Discharge: Family;Available 24 hours/day Type of Home: House Home Access: Stairs to enter Entrance Stairs-Rails: Doctor, general practice of Steps: 4 Home Layout: Able to live on main level with bedroom/bathroom Home Equipment: Walker - 2 wheels;Cane - single point;Bedside commode      Prior Function Level of Independence: Independent with assistive device(s)         Comments: cane or no AD     Hand Dominance   Dominant Hand: Right    Extremity/Trunk Assessment   Upper Extremity Assessment Upper Extremity Assessment: Generalized weakness    Lower Extremity Assessment Lower Extremity Assessment: Generalized weakness(deconditioning from time in hospital)    Cervical / Trunk Assessment Cervical / Trunk Assessment: (trunk flexion due to pain)  Communication   Communication: No difficulties  Cognition Arousal/Alertness: Awake/alert Behavior During Therapy: WFL for tasks assessed/performed Overall Cognitive Status: Within Functional Limits for tasks assessed                                        General Comments  General comments (skin integrity, edema, etc.): performed standing lumbar extension at sink for erect posture     Exercises     Assessment/Plan    PT Assessment Patient needs continued PT services  PT Problem List Decreased activity tolerance;Decreased mobility;Pain       PT Treatment Interventions DME instruction;Gait training;Stair training;Functional mobility training;Therapeutic activities;Therapeutic exercise;Patient/family education;Balance training    PT Goals (Current goals can be found in the Care Plan section)  Acute Rehab PT Goals Patient Stated Goal: return home PT Goal Formulation: With patient Time For Goal Achievement: 01/02/20 Potential to Achieve Goals: Good    Frequency Min 3X/week   Barriers to discharge        Co-evaluation               AM-PAC PT "6 Clicks" Mobility  Outcome Measure Help needed turning from your back to your side while in a flat bed without using bedrails?: None Help needed moving from lying on your back to sitting on the side of a flat bed without using bedrails?: None Help needed moving to and from a bed to a chair (including a wheelchair)?: None Help needed standing up from a chair using your arms (e.g., wheelchair or bedside chair)?: A Little Help needed to walk in hospital room?: A Little Help needed climbing 3-5 steps with a railing? : A Little 6 Click Score: 21    End of Session   Activity Tolerance: Patient tolerated treatment well Patient left: in bed;with call bell/phone within reach Nurse Communication: Mobility status PT Visit Diagnosis: Pain;Difficulty in walking, not elsewhere classified (R26.2) Pain - Right/Left: Right Pain - part of body: (quadrant)    Time: 1856-3149 PT Time Calculation (min) (ACUTE ONLY): 30 min   Charges:   PT Evaluation $PT Eval Moderate Complexity: 1 Mod PT Treatments $Gait Training: 8-22 mins        Leighton Roach, [place]  Pager [phone] Office  [place] 12/19/2019, 4:24 PM

## 2019-12-19 NOTE — Progress Notes (Signed)
Subjective: CC: SOB  We were called by hospitalist to evaluate patient as patient was concerned that a bile leak caused her pleural effusion she was admitted for.  Patient known to me from admission 11/1-11/6.  During that admission, patient was admitted for acute cholecystitis.  She was taken to the OR on 11/3 for an attempted laparoscopic cholecystectomy. Unfortunately, it was a difficult case. Dr. Donne Hazel was able to dissect down several cm and it appeared that the stomach and duodenum were adherent to the galbladder and were not going to come down easily. He felt there was not a way to do a subtotal or get down to find the structures in the triangle without a major operation or injury. He also felt that putting a drain in by me would not be a good idea as opposed to an IR drain that traverses the liver. The patient was taken to IR and had a perc chole drain place on 11/4. LFT's and WBC count normalized post op. Patient diet was advanced and tolerated.  She was discharged home on 11/6.  She had a f/u cholangiogram on 12/17 that showed patency of the cystic duct. She follow-up with Dr. Donne Hazel on 11/29/2019.  Per chart review it appears they discussed possible laparoscopic cholecystectomy in the future and the risks involved.  The patient wished to have her drain removed so referral sent to IR.  IR removed Perc Chole drain on 11/13.  Patient presented to the ED on 1/16 with pleuritic right sided pain and some sob.  She underwent a CT scan that showed a loculated right pleural effusion with some questionable pleural thickening and enhancement worrisome for empyema. IR performed a US guided right thoracentesis on 1/18 that yielded 800 mL of amber fluid.   Currently she is not having any abdominal pain. She is on a Heart Healthy diet and tolerating. No N/V. She complains of flank/right lower chest pain with breathing as well as mild sob. She is on RA.   Objective: Vital signs in last 24  hours: Temp:  [98 F (36.7 C)-99.3 F (37.4 C)] 98 F (36.7 C) (01/19 0510) Pulse Rate:  [63-75] 63 (01/19 0510) Resp:  [16-20] 18 (01/19 0510) BP: (145-160)/(72-83) 160/83 (01/19 0510) SpO2:  [98 %-100 %] 98 % (01/19 0510) Last BM Date: 12/13/19  Intake/Output from previous day: 01/18 0701 - 01/19 0700 In: 1127.1 [P.O.:840; I.V.:151; IV Piggyback:136.1] Out: 3500 [Urine:3500] Intake/Output this shift: Total I/O In: 120 [P.O.:120] Out: 600 [Urine:600]  PE: Gen:  Alert, NAD, pleasant Card:  RRR, no M/G/R heard Pulm:  CTAB, no W/R/R, effort normal Abd: Soft, mild distension, NT, negative Murphy's sign, no r/r/g. +BS Ext:  No LE edema  Psych: A&Ox3  Skin: no rashes noted, warm and dry  Lab Results:  Recent Labs    12/18/19 0221 12/19/19 0548  WBC 4.2 4.6  HGB 9.3* 9.9*  HCT 30.0* 30.7*  PLT 155 190   BMET Recent Labs    12/18/19 0221 12/19/19 0548  NA 140 141  K 3.5 3.6  CL 107 105  CO2 26 30  GLUCOSE 110* 95  BUN 5* <5*  CREATININE 0.75 0.90  CALCIUM 8.3* 8.6*   PT/INR No results for input(s): LABPROT, INR in the last 72 hours. CMP     Component Value Date/Time   NA 141 12/19/2019 0548   K 3.6 12/19/2019 0548   CL 105 12/19/2019 0548   CO2 30 12/19/2019 0548   GLUCOSE  95 12/19/2019 0548   BUN <5 (L) 12/19/2019 0548   CREATININE 0.90 12/19/2019 0548   CALCIUM 8.6 (L) 12/19/2019 0548   PROT 5.6 (L) 12/19/2019 0548   ALBUMIN 2.3 (L) 12/19/2019 0548   AST 12 (L) 12/19/2019 0548   ALT 11 12/19/2019 0548   ALKPHOS 194 (H) 12/19/2019 0548   BILITOT 0.7 12/19/2019 0548   GFRNONAA >60 12/19/2019 0548   GFRAA >60 12/19/2019 0548   Lipase     Component Value Date/Time   LIPASE 16 12/16/2019 1400       Studies/Results: DG Chest 1 View  Result Date: 12/18/2019 CLINICAL DATA:  Post RIGHT thoracentesis EXAM: CHEST  1 VIEW COMPARISON:  10/01/2019 FINDINGS: Enlargement of cardiac silhouette. Mediastinal contours and pulmonary vascularity normal.  Spinal fixation rods from prior surgery. Small RIGHT pleural effusion and basilar atelectasis. No acute infiltrate or pneumothorax. No acute osseous findings. IMPRESSION: No pneumothorax following RIGHT thoracentesis. Residual RIGHT pleural effusion and basilar atelectasis. Electronically Signed   By: Ulyses Southward M.D.   On: 12/18/2019 14:21   IR THORACENTESIS ASP PLEURAL SPACE W/IMG GUIDE  Result Date: 12/18/2019 INDICATION: Right upper quadrant and right chest pain. Status post recent cholecystostomy placement. Right pleural effusion. Request for diagnostic and therapeutic thoracentesis. EXAM: ULTRASOUND GUIDED RIGHT THORACENTESIS MEDICATIONS: 1% lidocaine 15 mL COMPLICATIONS: None immediate. PROCEDURE: An ultrasound guided thoracentesis was thoroughly discussed with the patient and questions answered. The benefits, risks, alternatives and complications were also discussed. The patient understands and wishes to proceed with the procedure. Written consent was obtained. Ultrasound was performed to localize and mark an adequate pocket of fluid in the right chest. The area was then prepped and draped in the normal sterile fashion. 1% Lidocaine was used for local anesthesia. Under ultrasound guidance a 6 Fr Safe-T-Centesis catheter was introduced. Thoracentesis was performed. The catheter was removed and a dressing applied. FINDINGS: A total of approximately 800 mL of amber fluid was removed. Samples were sent to the laboratory as requested by the clinical team. IMPRESSION: Successful ultrasound guided right thoracentesis yielding 800 mL of pleural fluid. No pneumothorax on post-procedure chest x-ray. Read by: Corrin Parker, PA-C Electronically Signed   By: Richarda Overlie M.D.   On: 12/18/2019 16:12    Anti-infectives: Anti-infectives (From admission, onward)   Start     Dose/Rate Route Frequency Ordered Stop   12/19/19 1100  doxycycline (VIBRA-TABS) tablet 100 mg     100 mg Oral Every 12 hours 12/19/19 1047      12/16/19 2330  piperacillin-tazobactam (ZOSYN) IVPB 3.375 g  Status:  Discontinued     3.375 g 12.5 mL/hr over 240 Minutes Intravenous Every 8 hours 12/16/19 1902 12/19/19 1047   12/16/19 1730  piperacillin-tazobactam (ZOSYN) IVPB 3.375 g     3.375 g 100 mL/hr over 30 Minutes Intravenous  Once 12/16/19 1721 12/16/19 1815       Assessment/Plan HTN HLD CKD Right Pleural Effusion - s/p US guided right thoracentesis on 1/18 that yielded 800 mL of amber fluid. No biliary like fluid. Abx per TRH  Hx of Acute Cholecystitis  - s/p attempted laparoscopic cholecystectomy by Dr. Dwain Sarna 10/03/2019 - s/p IR perc chole drain place on 11/4 - f/u cholangiogram on 12/17 that showed patency of the cystic duct.  - IR removed Perc Chole drain on 11/13. - Imaging and labs are not c/w Acute Cholecystitis. She is non-tender and tolerating a diet. No indication for emergent surgery at this time. She is scheduled to see Dr. Dwain Sarna  on 12/25/2019 at 4:20pm.    LOS: 3 days    Jacinto Halim , [facility] 12/19/2019, 11:44 AM Please see Amion for pager number during day hours 7:00am-4:30pm

## 2019-12-19 NOTE — Progress Notes (Signed)
PROGRESS NOTE    [place]  WUJ:[id number] DOB: 15-Mar-1951 DOA: 12/16/2019 PCP: Maurice Small, MD  Brief Narrative:  Patient is a 69 year old female with PMH significant for HTN, HL who was recently discharged 2 weeks ago after treatment for acute cholecystitis. Initial laparoscopic cholecystectomy was unsuccessful due to likely adherence of gallbladder to bowel. She was treated instead with percutaneous cholecystostomy tube placed by IR and IV antibiotics. She did well clinically and was discharged home. 4 days prior to admission cholecystostomy tube was discontinued electively by IR. The following day patient developed right-sided pleuritic chest pain which progressed and patient presented to ED. CT scan in ED reveals a loculated pleural effusion on the right side with some question whether the fluid is in communication subdiaphragmatically with possible biliary leak.  12/19/19: Thoracentesis done 12/18/19 with 800 mL amber fluid removed with mesothial cells and a lot of PMNs, gram stain with no bacteria, culture pending. Patient requests to talk to surgeon while here.   Assessment & Plan:   Active Problems:   Loculated pleural effusion  Right pleural effusion -Thoracentesis with amber fluid -no bacteria on gram stain but after 3 days of antibiotics -color not consistent with bile leak -unfortunately bilirubin was not ordered prior to thoracentesis, talked to lab and likely will not be able to be added on due to needing light protection, also added on LDH and cytology to rule out other causes -clinically not much improved with thoracentesis -will change antibiotics to doxycycline to cover for pneumonia -if bile leak contact general surgery for consultation, patient today wants to see surgeon prior to going home so consulted today although fluid not consistent with color of bile for leak -will add duoneb q 4 prn to see if this can help with SOB -encouraged mobilization,  ordered PT/OT to assess functional status  HTN Continue atenolol and lisinopril  Anxiety and depression Continue citalopram  HL Continue statin  Hypothyroidism Continue Synthroid   DVT prophylaxis: heparin Gilliam Code Status: Full Family Communication: patient only Disposition Plan: possibly tomorrow if clinically improving  Consultants:   IR  General surgery  Procedures:   Thoracentesis 12/18/19 IR  Antimicrobials:   Zosyn 12/16/19-12/18/19  Doxycycline 12/19/19 start date, plan stop date 12/22/19  Subjective: Hurting from where she got thoracentesis yesterday. Abdomen still about the same distended. Denies pain in stomach or nausea or vomiting. Denies diarrhea or constipation. Overall SOB is stable from prior. No fevers or chills. Appetite is okay. Still having a lot of pain with breathing.   Objective: Vitals:   12/18/19 0451 12/18/19 1434 12/18/19 2150 12/19/19 0510  BP: (!) 158/82 (!) 145/76 (!) 148/72 (!) 160/83  Pulse: 71 75 72 63  Resp: 15 16 20 18   Temp: 98.2 F (36.8 C) 98 F (36.7 C) 99.3 F (37.4 C) 98 F (36.7 C)  TempSrc: Oral  Oral Oral  SpO2: 97% 98% 100% 98%  Weight:      Height:        Intake/Output Summary (Last 24 hours) at 12/19/2019 1050 Last data filed at 12/19/2019 1016 Gross per 24 hour  Intake 1247.08 ml  Output 3200 ml  Net -1952.92 ml   Filed Weights   12/16/19 1357  Weight: 79.8 kg    Examination:  General exam: Appears calm and comfortable  Respiratory system: Effort poor, otherwise normal Cardiovascular system: S1 & S2 heard, RRR. No JVD, murmurs, rubs, gallops or clicks. No pedal edema. Gastrointestinal system: Abdomen is mildly distended, soft and nontender. No  organomegaly or masses felt. Normal bowel sounds heard. Central nervous system: Alert and oriented. No focal neurological deficits. Extremities: Symmetric 5 x 5 power. Skin: No rashes, lesions or ulcers Psychiatry: Judgement and insight appear normal. Mood  & affect appropriate.   Data Reviewed: I have personally reviewed following labs and imaging studies  CBC: Recent Labs  Lab 12/16/19 1400 12/16/19 1605 12/17/19 0307 12/18/19 0221 12/19/19 0548  WBC 7.7  --  5.3 4.2 4.6  NEUTROABS 5.2  --   --   --   --   HGB 10.9* 11.6* 9.3* 9.3* 9.9*  HCT 34.5* 34.0* 29.1* 30.0* 30.7*  MCV 84.6  --  83.4 84.7 82.5  PLT 150  --  147* 155 190   Basic Metabolic Panel: Recent Labs  Lab 12/16/19 1400 12/16/19 1605 12/17/19 0307 12/18/19 0221 12/19/19 0548  NA 133* 133* 137 140 141  K 3.8 3.6 3.6 3.5 3.6  CL 99 96* 103 107 105  CO2 26  --  26 26 30   GLUCOSE 108* 101* 127* 110* 95  BUN 11 12 8  5* <5*  CREATININE 0.97 1.00 0.88 0.75 0.90  CALCIUM 8.6*  --  8.0* 8.3* 8.6*   GFR: Estimated Creatinine Clearance: 59.4 mL/min (by C-G formula based on SCr of 0.9 mg/dL). Liver Function Tests: Recent Labs  Lab 12/16/19 1400 12/19/19 0548  AST 14* 12*  ALT 11 11  ALKPHOS 116 194*  BILITOT 1.1 0.7  PROT 6.6 5.6*  ALBUMIN 3.0* 2.3*   Recent Labs  Lab 12/16/19 1400  LIPASE 16   No results for input(s): AMMONIA in the last 168 hours. Coagulation Profile: No results for input(s): INR, PROTIME in the last 168 hours. Cardiac Enzymes: No results for input(s): CKTOTAL, CKMB, CKMBINDEX, TROPONINI in the last 168 hours. BNP (last 3 results) No results for input(s): PROBNP in the last 8760 hours. HbA1C: No results for input(s): HGBA1C in the last 72 hours. CBG: No results for input(s): GLUCAP in the last 168 hours. Lipid Profile: No results for input(s): CHOL, HDL, LDLCALC, TRIG, CHOLHDL, LDLDIRECT in the last 72 hours. Thyroid Function Tests: No results for input(s): TSH, T4TOTAL, FREET4, T3FREE, THYROIDAB in the last 72 hours. Anemia Panel: No results for input(s): VITAMINB12, FOLATE, FERRITIN, TIBC, IRON, RETICCTPCT in the last 72 hours. Sepsis Labs: Recent Labs  Lab 12/17/19 0307  PROCALCITON <0.10    Recent Results (from the  past 240 hour(s))  SARS CORONAVIRUS 2 (TAT 6-24 HRS) Nasopharyngeal Nasopharyngeal Swab     Status: None   Collection Time: 12/16/19  7:45 PM   Specimen: Nasopharyngeal Swab  Result Value Ref Range Status   SARS Coronavirus 2 NEGATIVE NEGATIVE Final    Comment: (NOTE) SARS-CoV-2 target nucleic acids are NOT DETECTED. The SARS-CoV-2 RNA is generally detectable in upper and lower respiratory specimens during the acute phase of infection. Negative results do not preclude SARS-CoV-2 infection, do not rule out co-infections with other pathogens, and should not be used as the sole basis for treatment or other patient management decisions. Negative results must be combined with clinical observations, patient history, and epidemiological information. The expected result is Negative. Fact Sheet for Patients: 12/19/19 Fact Sheet for Healthcare Providers: 12/18/19 This test is not yet approved or cleared by the [employer] FDA and  has been authorized for detection and/or diagnosis of SARS-CoV-2 by FDA under an Emergency Use Authorization (EUA). This EUA will remain  in effect (meaning this test can be used) for the duration of the  COVID-19 declaration under Section 56 4(b)(1) of the Act, 21 U.S.C. section 360bbb-3(b)(1), unless the authorization is terminated or revoked sooner. Performed at [facility], [place] [address] [place], [place] [address]   Gram stain     Status: None   Collection Time: 12/18/19  2:14 PM   Specimen: PATH Cytology Peritoneal fluid  Result Value Ref Range Status   Specimen Description PERITONEAL  Final   Special Requests NONE  Final   Gram Stain   Final    MODERATE WBC PRESENT, PREDOMINANTLY PMN NO ORGANISMS SEEN Performed at [facility], [place] [address], [place], [place] [address]    Report Status 12/18/2019 FINAL  Final  Culture, body fluid-bottle     Status: None (Preliminary  result)   Collection Time: 12/18/19  2:14 PM   Specimen: Peritoneal Washings  Result Value Ref Range Status   Specimen Description PERITONEAL  Final   Special Requests NONE  Final   Culture   Final    NO GROWTH < 24 HOURS Performed at [facility], [place] [address] [place], [place] [address]    Report Status PENDING  Incomplete     Radiology Studies: DG Chest 1 View  Result Date: 12/18/2019 CLINICAL DATA:  Post RIGHT thoracentesis EXAM: CHEST  1 VIEW COMPARISON:  10/01/2019 FINDINGS: Enlargement of cardiac silhouette. Mediastinal contours and pulmonary vascularity normal. Spinal fixation rods from prior surgery. Small RIGHT pleural effusion and basilar atelectasis. No acute infiltrate or pneumothorax. No acute osseous findings. IMPRESSION: No pneumothorax following RIGHT thoracentesis. Residual RIGHT pleural effusion and basilar atelectasis. Electronically Signed   By: Lavonia Dana M.D.   On: 12/18/2019 14:21   IR THORACENTESIS ASP PLEURAL SPACE W/IMG GUIDE  Result Date: 12/18/2019 INDICATION: Right upper quadrant and right chest pain. Status post recent cholecystostomy placement. Right pleural effusion. Request for diagnostic and therapeutic thoracentesis. EXAM: ULTRASOUND GUIDED RIGHT THORACENTESIS MEDICATIONS: 1% lidocaine 15 mL COMPLICATIONS: None immediate. PROCEDURE: An ultrasound guided thoracentesis was thoroughly discussed with the patient and questions answered. The benefits, risks, alternatives and complications were also discussed. The patient understands and wishes to proceed with the procedure. Written consent was obtained. Ultrasound was performed to localize and mark an adequate pocket of fluid in the right chest. The area was then prepped and draped in the normal sterile fashion. 1% Lidocaine was used for local anesthesia. Under ultrasound guidance a 6 Fr Safe-T-Centesis catheter was introduced. Thoracentesis was performed. The catheter was removed and a dressing applied.  FINDINGS: A total of approximately 800 mL of amber fluid was removed. Samples were sent to the laboratory as requested by the clinical team. IMPRESSION: Successful ultrasound guided right thoracentesis yielding 800 mL of pleural fluid. No pneumothorax on post-procedure chest x-ray. Read by: Gareth Eagle, PA-C Electronically Signed   By: Markus Daft M.D.   On: 12/18/2019 16:12   Scheduled Meds: . aspirin EC  81 mg Oral QHS  . atenolol  50 mg Oral Daily  . citalopram  20 mg Oral Daily  . cycloSPORINE  1 drop Both Eyes BID  . doxycycline  100 mg Oral Q12H  . heparin  5,000 Units Subcutaneous Q8H  . levothyroxine  50 mcg Oral Daily  . lisinopril  5 mg Oral Daily  . loratadine  10 mg Oral Daily  . multivitamin with minerals  1 tablet Oral QHS  . oxybutynin  10 mg Oral Daily  . pantoprazole  40 mg Oral BID  . pregabalin  150 mg  Oral BID  . rosuvastatin  10 mg Oral QHS   Continuous Infusions: . sodium chloride Stopped (12/18/19 1435)     LOS: 3 days   Time spent: 25  Myrlene Broker, MD [facility] Pager [phone]  If 7PM-7AM, please contact night-coverage [email or web address] Password [email or web address] 12/19/2019, 10:50 AM

## 2019-12-20 ENCOUNTER — Encounter (HOSPITAL_COMMUNITY): Payer: Self-pay | Admitting: Internal Medicine

## 2019-12-20 ENCOUNTER — Inpatient Hospital Stay (HOSPITAL_COMMUNITY): Payer: 59

## 2019-12-20 DIAGNOSIS — E7800 Pure hypercholesterolemia, unspecified: Secondary | ICD-10-CM

## 2019-12-20 DIAGNOSIS — F419 Anxiety disorder, unspecified: Secondary | ICD-10-CM

## 2019-12-20 DIAGNOSIS — J869 Pyothorax without fistula: Secondary | ICD-10-CM

## 2019-12-20 DIAGNOSIS — J90 Pleural effusion, not elsewhere classified: Secondary | ICD-10-CM

## 2019-12-20 DIAGNOSIS — D649 Anemia, unspecified: Secondary | ICD-10-CM

## 2019-12-20 LAB — PATHOLOGIST SMEAR REVIEW

## 2019-12-20 LAB — C-REACTIVE PROTEIN: CRP: 7.9 mg/dL — ABNORMAL HIGH (ref <1.0)

## 2019-12-20 LAB — SEDIMENTATION RATE: Sed Rate: 62 mm/hr — ABNORMAL HIGH (ref 0–22)

## 2019-12-20 LAB — LACTATE DEHYDROGENASE: LDH: 124 U/L (ref 98–192)

## 2019-12-20 MED ORDER — MORPHINE SULFATE (PF) 2 MG/ML IV SOLN
2.0000 mg | INTRAVENOUS | Status: DC | PRN
  Filled 2019-12-20: qty 1

## 2019-12-20 MED ORDER — IOHEXOL 350 MG/ML SOLN
75.0000 mL | Freq: Once | INTRAVENOUS | Status: AC | PRN
  Administered 2019-12-20: 75 mL via INTRAVENOUS

## 2019-12-20 MED ORDER — PIPERACILLIN-TAZOBACTAM 3.375 G IVPB
3.3750 g | Freq: Three times a day (TID) | INTRAVENOUS | Status: DC
  Administered 2019-12-20 – 2019-12-25 (×16): 3.375 g via INTRAVENOUS
  Filled 2019-12-20 (×16): qty 50

## 2019-12-20 NOTE — Progress Notes (Signed)
Physical Therapy Treatment Patient Details Name: Suzanne Harrison MRN: [id number] DOB: 13-Nov-1951 Today's Date: 12/20/2019    History of Present Illness Patient is a 69 year old female with PMH significant for HTN, HL who was recently discharged 2 weeks ago after treatment for acute cholecystitis. Pt returns with R quadrant pain and loculated pleural effusion. Thoracentesis performed 1/18.    PT Comments    [name], agreeable to PT session focused on progression of ambulation and dynamic stability. [name] with ambulation and tolerance for higher-level balance challenges with ambulation (head turns, direction changes, speed changes) with supervision and use of her straight cane. The pt was also guided through multiple LE strengthening and balance exercises, which will continue to benefit her static and dynamic stability. [name] to maximize functional mobility and stability training prior to anticipated d/c home.     Follow Up Recommendations  No PT follow up     Equipment Recommendations  None recommended by PT    Recommendations for Other Services       Precautions / Restrictions Precautions Precautions: None Restrictions Weight Bearing Restrictions: No    Mobility  Bed Mobility     Rolling: Modified independent (Device/Increase time)         General bed mobility comments: pt OOB in bathroom upon arrival  Transfers Overall transfer level: Needs assistance Equipment used: Straight cane;None Transfers: Sit to/from Stand Sit to Stand: Modified independent (Device/Increase time)         General transfer comment: pt able to perform multiple sit-stand in bathroom unsupervised, demos good stability with and without AD with supervised sit-stands in session  Ambulation/Gait Ambulation/Gait assistance: Supervision Gait  Distance (Feet): 250 Feet Assistive device: Straight cane Gait Pattern/deviations: Antalgic;Trunk flexed;Step-through pattern Gait velocity: decreased Gait velocity interpretation: 1.31 - 2.62 ft/sec, indicative of limited community ambulator General Gait Details: pt reported pain when standing erect, kept trunk flexed during gait. No LOB, safe with use of cane   Stairs             Wheelchair Mobility    Modified Rankin (Stroke Patients Only)       Balance Overall balance assessment: Mild deficits observed, not formally tested;Needs assistance                   Tandem Stance - Right Leg: 8(R leg back) Tandem Stance - Left Leg: 20(left leg back)     High level balance activites: Head turns High Level Balance Comments: pt with no LOB with head turns or changes in direction with use of straight cane            Cognition Arousal/Alertness: Awake/alert Behavior During Therapy: WFL for tasks assessed/performed Overall Cognitive Status: Within Functional Limits for tasks assessed                                        Exercises General Exercises - Lower Extremity Hip ABduction/ADduction: AROM;Both;5 reps;Standing Heel Raises: AROM;10 reps;Standing Other Exercises Other Exercises: standing hip extension 2 x 5 bilat    General Comments        Pertinent Vitals/Pain Pain Assessment: Faces Faces Pain Scale: Hurts little more Pain Location: RUQ with inhale Pain Descriptors / Indicators: Sharp Pain Intervention(s): Monitored during session;Limited activity within patient's tolerance;Repositioned  Home Living                      Prior Function            PT Goals (current goals can now be found in the care plan section) Acute Rehab PT Goals Patient Stated Goal: return home PT Goal Formulation: With patient Time For Goal Achievement: 01/02/20 Potential to Achieve Goals: Good Progress towards PT goals: Progressing toward  goals    Frequency           PT Plan Current plan remains appropriate    Co-evaluation              AM-PAC PT "6 Clicks" Mobility   Outcome Measure  Help needed turning from your back to your side while in a flat bed without using bedrails?: None Help needed moving from lying on your back to sitting on the side of a flat bed without using bedrails?: None Help needed moving to and from a bed to a chair (including a wheelchair)?: None Help needed standing up from a chair using your arms (e.g., wheelchair or bedside chair)?: A Little Help needed to walk in hospital room?: A Little Help needed climbing 3-5 steps with a railing? : A Little 6 Click Score: 21    End of Session Equipment Utilized During Treatment: Gait belt Activity Tolerance: Patient tolerated treatment well Patient left: in bed;with call bell/phone within reach Nurse Communication: Mobility status PT Visit Diagnosis: Pain;Difficulty in walking, not elsewhere classified (R26.2) Pain - Right/Left: Right Pain - part of body: (upper quadrant)     Time: 3016-0109 PT Time Calculation (min) (ACUTE ONLY): 25 min  Charges:  $Gait Training: 8-22 mins $Therapeutic Exercise: 8-22 mins                     Suzanne Harrison, PT, DPT   Acute Rehabilitation Department Pager #: [phone]   Suzanne Harrison 12/20/2019, 3:34 PM

## 2019-12-20 NOTE — Consult Note (Signed)
NAME:  Suzanne Harrison, MRN:  [id number], DOB:  07/05/1951, LOS: 4 ADMISSION DATE:  12/16/2019, CONSULTATION DATE:  12/20/19 REFERRING MD:  Dyanne Iha  CHIEF COMPLAINT:  Dyspnea, pleuritic pain.   Brief History   Suzanne Harrison is a 69 y.o. female who was admitted 1/16 with right pleuritic pain and mild dyspnea.  Had acute cholecystitis in Nov 2020 where lap chole was attempted but not successful.  Ultimately had perc chole placed 11/4 and removed 11/13. Returned 1/16 with above symptoms.  Had thora by IR with 836ml removed.  Analysis suggestive of empyema. PCCM asked to see 1/20 for further recs.  History of present illness   Suzanne Harrison is a 69 y.o. female who has a PMH including but not limited to HTN, HLD (see "past medical history" for rest).  She had recent admission 10/01/19 through 10/05/20 for acute cholecystitis.  She had unsuccessful lap chole on 10/03/19 secondary to adherence of GB to bowel and subsequently had per chole tube placed by IR on 10/04/19.  She was discharged home on 10/06/19 and had tube removed on 11/13.    She then presented to [facility] ED 1/16 with right sided pleuritic chest pain and dyspnea.  CT A / P demonstrated loculated pleural effusion.  There was some concern for fluid communication subdiaphragmatically with possible biliary leak; however, after being evaluated by surgery, it was not felt to be the case.  She had right thora performed by IR on 1/19 and had 868ml of amber fluid removed.  Labs suggestive of infection / empyema (2880 WBCs with 60% neuts).  She was initially started on doxy and later changed to zosyn.  PCCM asked to see in consultation 1/20 for further recs.  Bedside POCUS performed which showed large fluid pocket on the right with some atelectatic lung flaps inferomedially.   We have requested dedicated CT chest to be performed for further evaluation of effusion characteristics.  Past Medical History  has Hip arthritis; History of total hip  arthroplasty; Referred ear pain; Acute cholecystitis; and Loculated pleural effusion on their problem list.  Significant Hospital Events   1/16 > admit. 1/19 > R thora by IR with 850ml amber fluid removed (2880 WBCs with 60% neuts). 1/20 > PCCM consulted.  Consults:  IR, surgery, PCCM.  Procedures:  1/19 > R thora.  Significant Diagnostic Tests:  CT A / P 1/16 > Loculated right effusion with some questionable pleural thickening CT chest 1/20 >   Micro Data:  Blood 1/20 >  R pleural fluid 1/19 >  SARS CoV2 1/16 >   Antimicrobials:  Doxy 1/16 > 1/20. Zosyn 1/20 >    Interim history/subjective:  Has right pleuritic pain mainly with deep breaths.  Otherwise no complaints.  Objective:  Blood pressure (!) 145/71, pulse 63, temperature 98.2 F (36.8 C), temperature source Oral, resp. rate 17, height 5\' 2"  (1.575 m), weight 79.8 kg, SpO2 97 %.        Intake/Output Summary (Last 24 hours) at 12/20/2019 1621 Last data filed at 12/20/2019 1346 Gross per 24 hour  Intake 536 ml  Output 3150 ml  Net -2614 ml   Filed Weights   12/16/19 1357  Weight: 79.8 kg    Examination: General: Adult female, in NAD. Neuro: A&O x 3, no deficits. HEENT: [place]/AT. Sclerae anicteric.  EOMI. Cardiovascular: RRR, no M/R/G.  Lungs: Respirations even and unlabored.  Clear bilaterally though slightly diminished on right base. Abdomen: BS x 4, soft, NT/ND.  Musculoskeletal:  No gross deformities, no edema.  Skin: Intact, warm, no rashes.  Assessment & Plan:   Right sided pleural effusion / empyema - had thora 1/19 with fluid suggestive of empyema (2880 WBC's with neutrophil predominance, no additional studies sent).  Bedside POCUS performed which showed significant fluid remaining on the right with a portion of atelectatic lung inferior medially. - Have asked for dedicated CT chest to better characterize effusion and assess for further loculations / septations / etc. - If fluid is free flowing, will  place chest tube and use tPA / DNAse if needed to facilitate drainage. - If multiple loculations / septations, will likely need to involve TCTS. - Continue empiric zosyn and follow cultures.  PCCM will follow.  Best Practice:  Diet: Regular. Pain/Anxiety/Delirium protocol (if indicated): N/A. VAP protocol (if indicated): N/A. DVT prophylaxis: SCD's / Heparin. GI prophylaxis: N/A. Glucose control: Per primary. Mobility: Per primary. Code Status: Full. Family Communication: Per primary. Disposition: Med Surg  Labs   CBC: Recent Labs  Lab 12/16/19 1400 12/16/19 1605 12/17/19 0307 12/18/19 0221 12/19/19 0548  WBC 7.7  --  5.3 4.2 4.6  NEUTROABS 5.2  --   --   --   --   HGB 10.9* 11.6* 9.3* 9.3* 9.9*  HCT 34.5* 34.0* 29.1* 30.0* 30.7*  MCV 84.6  --  83.4 84.7 82.5  PLT 150  --  147* 155 190   Basic Metabolic Panel: Recent Labs  Lab 12/16/19 1400 12/16/19 1605 12/17/19 0307 12/18/19 0221 12/19/19 0548  NA 133* 133* 137 140 141  K 3.8 3.6 3.6 3.5 3.6  CL 99 96* 103 107 105  CO2 26  --  26 26 30   GLUCOSE 108* 101* 127* 110* 95  BUN 11 12 8  5* <5*  CREATININE 0.97 1.00 0.88 0.75 0.90  CALCIUM 8.6*  --  8.0* 8.3* 8.6*   GFR: Estimated Creatinine Clearance: 59.4 mL/min (by C-G formula based on SCr of 0.9 mg/dL). Recent Labs  Lab 12/16/19 1400 12/17/19 0307 12/18/19 0221 12/19/19 0548  PROCALCITON  --  <0.10  --   --   WBC 7.7 5.3 4.2 4.6   Liver Function Tests: Recent Labs  Lab 12/16/19 1400 12/19/19 0548  AST 14* 12*  ALT 11 11  ALKPHOS 116 194*  BILITOT 1.1 0.7  PROT 6.6 5.6*  ALBUMIN 3.0* 2.3*   Recent Labs  Lab 12/16/19 1400  LIPASE 16   No results for input(s): AMMONIA in the last 168 hours. ABG    Component Value Date/Time   TCO2 26 12/16/2019 1605    Coagulation Profile: No results for input(s): INR, PROTIME in the last 168 hours. Cardiac Enzymes: No results for input(s): CKTOTAL, CKMB, CKMBINDEX, TROPONINI in the last 168 hours.  HbA1C: No results found for: HGBA1C CBG: No results for input(s): GLUCAP in the last 168 hours.  Review of Systems:   All negative; except for those that are bolded, which indicate positives.  Constitutional: weight loss, weight gain, night sweats, fevers, chills, fatigue, weakness.  HEENT: headaches, sore throat, sneezing, nasal congestion, post nasal drip, difficulty swallowing, tooth/dental problems, visual complaints, visual changes, ear aches. Neuro: difficulty with speech, weakness, numbness, ataxia. CV:  chest pain, orthopnea, PND, swelling in lower extremities, dizziness, palpitations, syncope.  Resp: cough, hemoptysis, dyspnea, right pleuritic pain, wheezing. GI: heartburn, indigestion, abdominal pain, nausea, vomiting, diarrhea, constipation, change in bowel habits, loss of appetite, hematemesis, melena, hematochezia.  GU: dysuria, change in color of urine, urgency or frequency, flank pain,  hematuria. MSK: joint pain or swelling, decreased range of motion. Psych: change in mood or affect, depression, anxiety, suicidal ideations, homicidal ideations. Skin: rash, itching, bruising.   Past medical history  She,  has a past medical history of CKD (chronic kidney disease), Gallstones (10/2019), Hypercholesteremia, and Hypertension.   Surgical History    Past Surgical History:  Procedure Laterality Date  . BACK SURGERY    . BILIARY DRAINAGE  10/03/2019   ATTEMPTED LAPAROSCOPIC CHOLECYSTECTOMY WITH PLACEMENT OF DRAIN (N/A Abdomen)  . CHOLECYSTECTOMY N/A 10/03/2019   Procedure: ATTEMPTED LAPAROSCOPIC CHOLECYSTECTOMY WITH PLACEMENT OF DRAIN;  Surgeon: Emelia Loron, MD;  Location[facility];  Service: General;  Laterality: N/A;  . IR RADIOLOGIST EVAL & MGMT  11/16/2019  . IR RADIOLOGIST EVAL & MGMT  12/13/2019  . IR THORACENTESIS ASP PLEURAL SPACE W/IMG GUIDE  12/18/2019  . REPLACEMENT TOTAL HIP W/  RESURFACING IMPLANTS       Social History   reports that she has never smoked.  She has never used smokeless tobacco. She reports that she does not drink alcohol or use drugs.   Family history   Her family history is not on file.   Allergies Allergies  Allergen Reactions  . Gatifloxacin Rash and Other (See Comments)    Other reaction(s): Myalgias (Muscle Pain) Rash in mouth   . Sulfa Antibiotics Nausea Only     Home meds  Prior to Admission medications   Medication Sig Start Date End Date Taking? Authorizing Provider  amoxicillin (AMOXIL) 500 MG tablet Take 2,000 mg by mouth See admin instructions. Take four tablets (2000 mg) by mouth one hour prior to dental procedure 12/05/19  Yes [provider]  aspirin EC 81 MG tablet Take 81 mg by mouth at bedtime.   Yes [provider]  atenolol (TENORMIN) 50 MG tablet Take 50 mg by mouth daily. 09/18/19  Yes [provider]  Calcium Carbonate-Vitamin D (CALCIUM-D PO) Take 1-2 tablets by mouth See admin instructions. Take 2 tablets by mouth every morning and 1 tablet at night   Yes [provider]  cetirizine (ZYRTEC) 10 MG tablet Take 10 mg by mouth daily.   Yes [provider]  citalopram (CELEXA) 20 MG tablet Take 20 mg by mouth daily. 02/23/18  Yes [provider]  DENTA 5000 PLUS 1.1 % CREA dental cream Place 1 application onto teeth 2 (two) times daily. Do not eat, drink or rinse for 30 minutes after use. 08/12/19  Yes [provider]  levothyroxine (SYNTHROID, LEVOTHROID) 50 MCG tablet Take 50 mcg by mouth daily. 02/15/18  Yes [provider]  Lifitegrast Benay Spice) 5 % SOLN Place 1 drop into both eyes 2 (two) times daily.   Yes [provider]  lisinopril (PRINIVIL,ZESTRIL) 5 MG tablet Take 5 mg by mouth daily. 02/09/18  Yes [provider]  Multiple Vitamin (MULTIVITAMIN WITH MINERALS) TABS tablet Take 1 tablet by mouth daily.    Yes [provider]  Multiple Vitamins-Minerals (HAIR/SKIN/NAILS/BIOTIN) TABS Take 1 tablet by  mouth daily.    Yes [provider]  oxybutynin (DITROPAN-XL) 10 MG 24 hr tablet Take 10 mg by mouth daily. 02/13/18  Yes [provider]  oxyCODONE-acetaminophen (PERCOCET) 10-325 MG tablet Take 1 tablet by mouth 4 (four) times daily as needed for pain.  12/15/19  Yes [provider]  pantoprazole (PROTONIX) 40 MG tablet Take 40 mg by mouth 2 (two) times daily. 02/11/18  Yes [provider]  polyvinyl alcohol (ARTIFICIAL TEARS)  1.4 % ophthalmic solution Place 1 drop into both eyes 5 (five) times daily.   Yes [provider]  pregabalin (LYRICA) 150 MG capsule Take 150 mg by mouth 2 (two) times daily.    Yes [provider]  rosuvastatin (CRESTOR) 10 MG tablet Take 10 mg by mouth at bedtime.  02/15/18  Yes [provider]  methocarbamol (ROBAXIN) 750 MG tablet Take 1 tablet (750 mg total) by mouth every 6 (six) hours as needed for muscle spasms. Patient not taking: Reported on 12/16/2019 10/06/19   Maczis, Elmer Sow, PA-C  ondansetron (ZOFRAN-ODT) 4 MG disintegrating tablet Take 1 tablet (4 mg total) by mouth every 6 (six) hours as needed for nausea. Patient not taking: Reported on 12/16/2019 10/06/19   Jacinto Halim, PA-C    Rutherford Guys, PA - [facility] 12/20/2019, 4:21 PM

## 2019-12-20 NOTE — Progress Notes (Signed)
Discussed the necessity, benefit and complications of small bore chest tube placement with patient at length based on CT findings of R sided loculated effusion.  I also localized and evaluated fluid by [place], clearly loculated in pockets mostly posteriorly. Patient is not interested in having chest tube placed until she has the opportunity to speak with her family who she does not seem to be able to get them on the phone.  Due to the non emergent nature of the procedure and the patient refusing to have it done tonight, will wait for the day time pulmonary consultant to discuss with her and family in the am.

## 2019-12-20 NOTE — Progress Notes (Addendum)
PROGRESS NOTE    [place] Dress  ZWC:[id number]  DOB: [birth year]  PCP: Kelton Pillar, MD Admit date:12/16/2019  69 year old female with history of hypertension, hyperlipidemia, hypothyroidism who was admitted to general surgery service 11/1 to 10/06/2019 for acute cholecystitis but unable to perform laparoscopic cholecystectomy due to adherence to stomach and duodenum-ended up getting a percutaneous drain placed through IR on 11/4, had PCT drain removed as outpatient recently subsequent to which she has been experiencing abdominal discomfort, right-sided pleuritic chest pain, intermittent fevers with T-max of 102 last week.  Patient presented to the ED on 1/16 with chief complaint of pleuritic chest pain.  Of note, patient had a f/u cholangiogram on 12/17 that showed patency of the cystic duct. She follow-up with Dr. Donne Hazel on 11/29/2019 and she was planned for elective cholecystectomy.   Patient apparently was also planned for dental extraction on 1/18 for decayed right molar and received prophylactic antibiotics (amoxicillin) on Sunday. ED Course: Afebrile, normotensive, satting well on room air.  CT abdomen pelvis was obtained as family concerned about complications after removal of PCT given timing of symptoms.  CT did not show any acute gallbladder pathology but revealed large right sided loculated pleural effusion concerning for diaphragmatic injury/biliary leak/parapneumonic effusion and possibly infection due to pleural enhancement. Hospital course: Patient admitted to [facility] for further evaluation. She underwent pleural tap through IR and 800 mL of "amber" fluid drained.  Follow-up chest x-ray did not show pneumothorax but showed persistent residual effusion.  Antibiotics were changed to oral doxycycline with discharge plan today.  Subjective:  Patient continues to complain of severe right-sided pleuritic pain.  Abdominal bloating sensation improved.  Husband very concerned about  discharge plan.  He reports patient been having fevers at home.  Fever curve shows T-max of 100.1 while here.  Noted to have orders for IV fluids at 250 mL/h.  Saturating well on room air.  Objective: Vitals:   12/19/19 1454 12/19/19 2037 12/20/19 0412 12/20/19 1340  BP: 132/69 (!) 151/70 (!) 165/82 (!) 145/71  Pulse: 63 66 62 63  Resp: '18 18 14 17  ' Temp: 98 F (36.7 C) 98.3 F (36.8 C) 98.2 F (36.8 C) 98.2 F (36.8 C)  TempSrc: Oral Oral Oral Oral  SpO2: 98% 96% 97% 97%  Weight:      Height:        Intake/Output Summary (Last 24 hours) at 12/20/2019 1755 Last data filed at 12/20/2019 1346 Gross per 24 hour  Intake 536 ml  Output 2850 ml  Net -2314 ml   Filed Weights   12/16/19 1357  Weight: 79.8 kg    Physical Examination:  General exam: Appears calm and comfortable  Respiratory system: Decreased breath sounds at right base/mid lung.  Respiratory effort normal although reports pleuritic discomfort on deep breath. Cardiovascular system: S1 & S2 heard, RRR. No JVD, murmurs, rubs, gallops or clicks. No pedal edema. Gastrointestinal system: Abdomen is nondistended, soft and nontender. Normal bowel sounds heard. Central nervous system: Alert and oriented. No new focal neurological deficits. Extremities: No contractures, edema or joint deformities.  Skin: No rashes, lesions or ulcers Psychiatry: Judgement and insight appear normal. Mood & affect anxious/worried.   Data Reviewed: I have personally reviewed following labs and imaging studies  CBC: Recent Labs  Lab 12/16/19 1400 12/16/19 1605 12/17/19 0307 12/18/19 0221 12/19/19 0548  WBC 7.7  --  5.3 4.2 4.6  NEUTROABS 5.2  --   --   --   --   HGB 10.9*  11.6* 9.3* 9.3* 9.9*  HCT 34.5* 34.0* 29.1* 30.0* 30.7*  MCV 84.6  --  83.4 84.7 82.5  PLT 150  --  147* 155 254   Basic Metabolic Panel: Recent Labs  Lab 12/16/19 1400 12/16/19 1605 12/17/19 0307 12/18/19 0221 12/19/19 0548  NA 133* 133* 137 140 141  K 3.8  3.6 3.6 3.5 3.6  CL 99 96* 103 107 105  CO2 26  --  '26 26 30  ' GLUCOSE 108* 101* 127* 110* 95  BUN '11 12 8 ' 5* <5*  CREATININE 0.97 1.00 0.88 0.75 0.90  CALCIUM 8.6*  --  8.0* 8.3* 8.6*   GFR: Estimated Creatinine Clearance: 59.4 mL/min (by C-G formula based on SCr of 0.9 mg/dL). Liver Function Tests: Recent Labs  Lab 12/16/19 1400 12/19/19 0548  AST 14* 12*  ALT 11 11  ALKPHOS 116 194*  BILITOT 1.1 0.7  PROT 6.6 5.6*  ALBUMIN 3.0* 2.3*   Recent Labs  Lab 12/16/19 1400  LIPASE 16   No results for input(s): AMMONIA in the last 168 hours. Coagulation Profile: No results for input(s): INR, PROTIME in the last 168 hours. Cardiac Enzymes: No results for input(s): CKTOTAL, CKMB, CKMBINDEX, TROPONINI in the last 168 hours. BNP (last 3 results) No results for input(s): PROBNP in the last 8760 hours. HbA1C: No results for input(s): HGBA1C in the last 72 hours. CBG: No results for input(s): GLUCAP in the last 168 hours. Lipid Profile: No results for input(s): CHOL, HDL, LDLCALC, TRIG, CHOLHDL, LDLDIRECT in the last 72 hours. Thyroid Function Tests: No results for input(s): TSH, T4TOTAL, FREET4, T3FREE, THYROIDAB in the last 72 hours. Anemia Panel: No results for input(s): VITAMINB12, FOLATE, FERRITIN, TIBC, IRON, RETICCTPCT in the last 72 hours. Sepsis Labs: Recent Labs  Lab 12/17/19 0307  PROCALCITON <0.10    Recent Results (from the past 240 hour(s))  SARS CORONAVIRUS 2 (TAT 6-24 HRS) Nasopharyngeal Nasopharyngeal Swab     Status: None   Collection Time: 12/16/19  7:45 PM   Specimen: Nasopharyngeal Swab  Result Value Ref Range Status   SARS Coronavirus 2 NEGATIVE NEGATIVE Final    Comment: (NOTE) SARS-CoV-2 target nucleic acids are NOT DETECTED. The SARS-CoV-2 RNA is generally detectable in upper and lower respiratory specimens during the acute phase of infection. Negative results do not preclude SARS-CoV-2 infection, do not rule out co-infections with other  pathogens, and should not be used as the sole basis for treatment or other patient management decisions. Negative results must be combined with clinical observations, patient history, and epidemiological information. The expected result is Negative. Fact Sheet for Patients: SugarRoll.be Fact Sheet for Healthcare Providers: https://www.woods-mathews.com/ This test is not yet approved or cleared by the [place] FDA and  has been authorized for detection and/or diagnosis of SARS-CoV-2 by FDA under an Emergency Use Authorization (EUA). This EUA will remain  in effect (meaning this test can be used) for the duration of the COVID-19 declaration under Section 56 4(b)(1) of the Act, 21 U.S.C. section 360bbb-3(b)(1), unless the authorization is terminated or revoked sooner. Performed at [facility], [place] [address], [place], [place] [address]   Gram stain     Status: None   Collection Time: 12/18/19  2:14 PM   Specimen: PATH Cytology Peritoneal fluid  Result Value Ref Range Status   Specimen Description PERITONEAL  Final   Special Requests NONE  Final   Gram Stain   Final    MODERATE WBC PRESENT, PREDOMINANTLY PMN NO ORGANISMS  SEEN Performed at [facility], [place] [address] [place], [place] [address]    Report Status 12/18/2019 FINAL  Final  Culture, body fluid-bottle     Status: None (Preliminary result)   Collection Time: 12/18/19  2:14 PM   Specimen: Peritoneal Washings  Result Value Ref Range Status   Specimen Description PERITONEAL  Final   Special Requests NONE  Final   Culture   Final    NO GROWTH 2 DAYS Performed at [facility] [address], [place], [place] [address]    Report Status PENDING  Incomplete      Radiology Studies: No results found.      Scheduled Meds: . aspirin EC  81 mg Oral QHS  . atenolol  50 mg Oral Daily  . citalopram  20 mg Oral Daily  . cycloSPORINE  1 drop Both Eyes BID  .  doxycycline  100 mg Oral Q12H  . heparin  5,000 Units Subcutaneous Q8H  . levothyroxine  50 mcg Oral Daily  . lisinopril  5 mg Oral Daily  . loratadine  10 mg Oral Daily  . multivitamin with minerals  1 tablet Oral QHS  . oxybutynin  10 mg Oral Daily  . pantoprazole  40 mg Oral BID  . pregabalin  150 mg Oral BID  . rosuvastatin  10 mg Oral QHS   Continuous Infusions: . piperacillin-tazobactam (ZOSYN)  IV 3.375 g (12/20/19 1500)    Assessment & Plan:   1.  Right-sided loculated pleural effusion/empyema: Body fluid analysis shows WBC 2880, neutrophil predominant (68), no organisms on Gram stain, no growth reported on body fluid cultures.  Given ongoing low-grade fevers and recent profound gallbladder infection, suspect bacteremia/septic emboli with exudative pleural effusion.  Will obtain dedicated CT chest with contrast to rule out pulmonary emboli and to better evaluate loculated effusions.  If blood cultures positive, may need TEE to rule out endocarditis.  Check inflammatory markers including ESR, C-reactive protein.  Consulted pulmonary who evaluated patient this afternoon and contemplating CT surgery evaluate if CT findings concerning for severe loculations which may require VATS. Changed antibiotics to broad-spectrum IV Zosyn.Continue oral doxycycline for atypical coverage. Continue pain medications as needed and neb treatments.  DC IV fluids to avoid fluid burden  2.  Recent cholecystitis: S/p PCT removal by IR.  Follows Dr. Donne Hazel and has scheduled      appointment on January 25.  Plan for elective cholecystectomy per family.  Seen by general surgery here who reviewed CT and did not recommend any acute interventions  3. Hypertension: Resumed home medications including atenolol, lisinopril.  4.  Hyperlipidemia: Resumed home medications.  5.  Hypothyroidism: On Synthroid   6.  Normocytic anemia: Anemia of chronic disease versus chronic blood loss anemia.  Will obtain iron studies  and stool for occult blood.?  Patient on aspirin as preventive measure -can likely hold if no acute indication.  7. Anxiety disorder: Resume home medications    DVT prophylaxis: Heparin Code Status: Full code Family / Patient Communication: Discussed with patient and discussed with husband on multiple occasions with updates.  He appreciated detailed conversations. Disposition Plan: Home when medically cleared     LOS: 4 days    Time spent: 35 minutes    [place]ford Shi, MD [facility] Pager [phone]  If 7PM-7AM, please contact night-coverage [email or web address] Password [facility] 12/20/2019, 5:55 PM

## 2019-12-21 ENCOUNTER — Inpatient Hospital Stay (HOSPITAL_COMMUNITY): Payer: 59

## 2019-12-21 DIAGNOSIS — E039 Hypothyroidism, unspecified: Secondary | ICD-10-CM

## 2019-12-21 LAB — IRON AND TIBC
Iron: 35 ug/dL (ref 28–170)
Saturation Ratios: 18 % (ref 10.4–31.8)
TIBC: 192 ug/dL — ABNORMAL LOW (ref 250–450)
UIBC: 157 ug/dL

## 2019-12-21 LAB — BODY FLUID CELL COUNT WITH DIFFERENTIAL
Eos, Fluid: 11 %
Lymphs, Fluid: 19 %
Monocyte-Macrophage-Serous Fluid: 24 % — ABNORMAL LOW (ref 50–90)
Neutrophil Count, Fluid: 46 % — ABNORMAL HIGH (ref 0–25)
Total Nucleated Cell Count, Fluid: 963 cu mm (ref 0–1000)

## 2019-12-21 LAB — FERRITIN: Ferritin: 84 ng/mL (ref 11–307)

## 2019-12-21 LAB — GLUCOSE, PLEURAL OR PERITONEAL FLUID: Glucose, Fluid: 89 mg/dL

## 2019-12-21 LAB — CYTOLOGY - NON PAP

## 2019-12-21 LAB — OCCULT BLOOD X 1 CARD TO LAB, STOOL: Fecal Occult Bld: NEGATIVE

## 2019-12-21 MED ORDER — SODIUM CHLORIDE (PF) 0.9 % IJ SOLN
10.0000 mg | Freq: Two times a day (BID) | INTRAMUSCULAR | Status: DC
  Administered 2019-12-21 – 2019-12-22 (×3): 10 mg via INTRAPLEURAL
  Filled 2019-12-21 (×6): qty 10

## 2019-12-21 MED ORDER — STERILE WATER FOR INJECTION IJ SOLN
5.0000 mg | Freq: Two times a day (BID) | RESPIRATORY_TRACT | Status: DC
  Administered 2019-12-21 – 2019-12-22 (×3): 5 mg via INTRAPLEURAL
  Filled 2019-12-21 (×6): qty 5

## 2019-12-21 MED ORDER — LORAZEPAM 2 MG/ML IJ SOLN
2.0000 mg | Freq: Once | INTRAMUSCULAR | Status: AC
  Administered 2019-12-21: 2 mg via INTRAVENOUS
  Filled 2019-12-21: qty 1

## 2019-12-21 MED ORDER — LIDOCAINE HCL (PF) 1 % IJ SOLN
30.0000 mL | Freq: Once | INTRAMUSCULAR | Status: AC
  Administered 2019-12-21: 30 mL via INTRADERMAL
  Filled 2019-12-21: qty 30

## 2019-12-21 MED ORDER — HYDROCODONE-ACETAMINOPHEN 5-325 MG PO TABS
1.0000 | ORAL_TABLET | Freq: Four times a day (QID) | ORAL | Status: DC | PRN
  Administered 2019-12-21 – 2019-12-23 (×5): 1 via ORAL
  Filled 2019-12-21 (×5): qty 1

## 2019-12-21 NOTE — Consult Note (Signed)
NAME:  Suzanne Harrison, MRN:  [id number], DOB:  11/15/1951, LOS: 5 ADMISSION DATE:  12/16/2019, CONSULTATION DATE:  12/20/19 REFERRING MD:  Dyanne Iha  CHIEF COMPLAINT:  Dyspnea, pleuritic pain.   Brief History   [place] is a 69 y.o. female who was admitted 1/16 with right pleuritic pain and mild dyspnea.  Had acute cholecystitis in Nov 2020 where lap chole was attempted but not successful.  Ultimately had perc chole placed 11/4 and removed 11/13. Returned 1/16 with above symptoms.  Had thora by IR with 842ml removed.  Analysis suggestive of empyema. PCCM asked to see 1/20 for further recs.  History of present illness   Suzanne Harrison is a 69 y.o. female who has a PMH including but not limited to HTN, HLD (see "past medical history" for rest).  She had recent admission 10/01/19 through 10/05/20 for acute cholecystitis.  She had unsuccessful lap chole on 10/03/19 secondary to adherence of GB to bowel and subsequently had per chole tube placed by IR on 10/04/19.  She was discharged home on 10/06/19 and had tube removed on 11/13.    She then presented to [facility] ED 1/16 with right sided pleuritic chest pain and dyspnea.  CT A / P demonstrated loculated pleural effusion.  There was some concern for fluid communication subdiaphragmatically with possible biliary leak; however, after being evaluated by surgery, it was not felt to be the case.  She had right thora performed by IR on 1/19 and had 862ml of amber fluid removed.  Labs suggestive of infection / empyema (2880 WBCs with 60% neuts).  She was initially started on doxy and later changed to zosyn.  PCCM asked to see in consultation 1/20 for further recs.  Bedside POCUS performed which showed large fluid pocket on the right with some atelectatic lung flaps inferomedially.   We have requested dedicated CT chest to be performed for further evaluation of effusion characteristics.  Past Medical History  has Hip arthritis; History of total hip  arthroplasty; Referred ear pain; Acute cholecystitis; and Loculated pleural effusion on their problem list.  Significant Hospital Events   1/16 > admit. 1/19 > R thora by IR with 880ml amber fluid removed (2880 WBCs with 60% neuts). 1/20 > PCCM consulted.  Consults:  IR, surgery, PCCM.  Procedures:  1/19 > R thora  Significant Diagnostic Tests:  CT A / P 1/16 > Loculated right effusion with some questionable pleural thickening CTA chest 1/20 > multiple loculated effusions, not all contiguous throughout the entire posterior hemithorax on the right  Micro Data:  Blood 1/20 >  R pleural fluid 1/19 >  SARS CoV2 1/16 >   Antimicrobials:  Doxy 1/16 > 1/20. Zosyn 1/20 >    Interim history/subjective:  Still having right sided back pain. Nervous about needing a chest tube.  Objective:  Blood pressure (!) 152/72, pulse 62, temperature 98.9 F (37.2 C), temperature source Oral, resp. rate 18, height 5\' 2"  (1.575 m), weight 79.8 kg, SpO2 97 %.        Intake/Output Summary (Last 24 hours) at 12/21/2019 0836 Last data filed at 12/21/2019 0425 Gross per 24 hour  Intake 400 ml  Output 1300 ml  Net -900 ml   Filed Weights   12/16/19 1357  Weight: 79.8 kg    Examination: General: elderly woman laying in bed in NAD Neuro: awake and alert, weak but able to sit forward in bed and walk with assistance HEENT: [place]/AT, eyes anicteric Cardiovascular: RRR, no  murmurs Lungs: decreased right upper and lower breath sounds, no rhonchi, rhales, wheezing Abdomen: soft, NT, ND  Musculoskeletal: no c/c/e Skin: no rashes or ecchymoses  Assessment & Plan:   Right sided loculated pleural effusion, possible empyema - had thora 1/19 with fluid suggestive of empyema (2880 WBC's with neutrophil predominance, no additional studies sent).  Bedside POCUS performed which showed significant fluid remaining on the right with a portion of atelectatic lung inferior medially. -chest tube this  morning -TPA/DNase x 6 doses over 3 days -will re-send pleural fluid studies and cultures -con't zosyn -likely will need opiate pain control to facilitate mobility, good pulmonary hygiene after chest tube is placed given the degree of pleural inflammation from this process. Recommend Norco 5-10mg  Q6h PRN for pain. Will start with lower doses after chest tube and increase as needed.   Best Practice:  Diet: Regular. Pain/Anxiety/Delirium protocol (if indicated): N/A. VAP protocol (if indicated): N/A. DVT prophylaxis: SCD's / Heparin. GI prophylaxis: N/A. Glucose control: Per primary. Mobility: Per primary. Code Status: Full. Family Communication: Per primary. Disposition: Med Surg  Labs   CBC: Recent Labs  Lab 12/16/19 1400 12/16/19 1605 12/17/19 0307 12/18/19 0221 12/19/19 0548  WBC 7.7  --  5.3 4.2 4.6  NEUTROABS 5.2  --   --   --   --   HGB 10.9* 11.6* 9.3* 9.3* 9.9*  HCT 34.5* 34.0* 29.1* 30.0* 30.7*  MCV 84.6  --  83.4 84.7 82.5  PLT 150  --  147* 155 190   Basic Metabolic Panel: Recent Labs  Lab 12/16/19 1400 12/16/19 1605 12/17/19 0307 12/18/19 0221 12/19/19 0548  NA 133* 133* 137 140 141  K 3.8 3.6 3.6 3.5 3.6  CL 99 96* 103 107 105  CO2 26  --  26 26 30   GLUCOSE 108* 101* 127* 110* 95  BUN 11 12 8  5* <5*  CREATININE 0.97 1.00 0.88 0.75 0.90  CALCIUM 8.6*  --  8.0* 8.3* 8.6*   GFR: Estimated Creatinine Clearance: 59.4 mL/min (by C-G formula based on SCr of 0.9 mg/dL). Recent Labs  Lab 12/16/19 1400 12/17/19 0307 12/18/19 0221 12/19/19 0548  PROCALCITON  --  <0.10  --   --   WBC 7.7 5.3 4.2 4.6   Liver Function Tests: Recent Labs  Lab 12/16/19 1400 12/19/19 0548  AST 14* 12*  ALT 11 11  ALKPHOS 116 194*  BILITOT 1.1 0.7  PROT 6.6 5.6*  ALBUMIN 3.0* 2.3*   Recent Labs  Lab 12/16/19 1400  LIPASE 16   No results for input(s): AMMONIA in the last 168 hours. ABG    Component Value Date/Time   TCO2 26 12/16/2019 1605    Coagulation  Profile: No results for input(s): INR, PROTIME in the last 168 hours. Cardiac Enzymes: No results for input(s): CKTOTAL, CKMB, CKMBINDEX, TROPONINI in the last 168 hours. HbA1C: No results found for: HGBA1C CBG: No results for input(s): GLUCAP in the last 168 hours.    12/18/19, DO 12/21/19 8:41 AM [facility]

## 2019-12-21 NOTE — Progress Notes (Signed)
PT Cancellation Note  Patient Details Name: Suzanne Harrison MRN: [id number] DOB: May 29, 1951   Cancelled Treatment:    Reason Eval/Treat Not Completed: Patient at procedure or test/unavailable (chest tube placement). PT will continue to f/u with pt acutely as available.    [name] 12/21/2019, 8:44 AM

## 2019-12-21 NOTE — Significant Event (Signed)
Rapid Response Event Note  Bedside Procedure - Chest Tube Insertion Procedure   Patient was placed on continuous telemetry, pulse ox, and BP monitoring. Informed consent was obtained, Time Out was done. VSS stable prior to, during, post procedure. SBP 100-130s, HR 60-70s, 99% on RA. Post procedure CXR was ordered, 300 cc came out of the chest tube after insertion.   Start Time 0815 End Time 0939  Suzanne Harrison R

## 2019-12-21 NOTE — Progress Notes (Addendum)
Patient out of trendlenberg. Chest tube lock to be opened at 1230.  1230 Patient's chest tube unclamped.   Confirmed with Dr. Chestine Spore she does not want chest tube flushed.

## 2019-12-21 NOTE — Progress Notes (Signed)
Patient back to normal bed position in supine position at 2035 and stop cock of chest tube was opened at 2105.  Will monitor.

## 2019-12-21 NOTE — Progress Notes (Signed)
PCCM Interval Progress Note  tPA / DNAse instilled into right posterior chest tube at 2005.  Pt placed in trendelenberg and instructed to try and roll onto right and left side for a couple of minutes each side.  RN asked to place pt back in normal supine position after 30 minutes (not trendeleberg).  Dwell time 1 hour.  RN informed to open stop cock at 2105 so that chest tube can be back to suction.   Rutherford Guys, [place] [facility] 12/21/2019, 8:08 PM

## 2019-12-21 NOTE — Procedures (Signed)
Chest Tube Insertion Procedure Note  Indications:  Clinically significant Effusion  Pre-operative Diagnosis: loculated pleural effusion  Post-operative Diagnosis: loculated pleural effusion  Procedure Details  Informed consent was obtained for the procedure, including sedation.  Risks of lung perforation, hemorrhage, arrhythmia, and adverse drug reaction were discussed.   After sterile skin prep, using standard technique, a 14 French tube was placed in the right posterior after [place] location of pleural fluid location.  Findings: 300 ml of serosanguinous fluid obtained  Estimated Blood Loss:  Minimal         Specimens:  Sent serosanguinous fluid              Complications:  None; patient tolerated the procedure well.         Disposition: remain in same room         Condition: stable   Steffanie Dunn, DO 12/21/19 9:41 AM [facility]

## 2019-12-21 NOTE — Progress Notes (Addendum)
PROGRESS NOTE    Suzanne Harrison  HUD:[id number] DOB: 12-Jun-1951 DOA: 12/16/2019 PCP: Maurice Small, MD    Brief Narrative:  69 year old female who presented with chest pain, she does have significant past medical history for chronic kidney disease, hypertension, dyslipidemia and recent cholecystitis, status post cholecystostomy tube.  About 10 days prior to hospitalization cholecystostomy tube was removed, 8 days later she developed right upper quadrant abdominal pain, radiated to the flank region, worse with deep breathing.  On her initial physical examination blood pressure was 138/69, heart rate 66, respirate 16, oxygen saturation 95%, her lungs had decreased breath sounds in the right lower field, heart S1-S2 present rhythmic, abdomen nontender, soft, lower extremities no edema. CT scan showed loculated right pleural effusion.   Patient was admitted to the hospital working diagnosis of new right pleural effusion to rule out empyema.   Patient underwent pleural drainage, 800 mL of amber fluid.  Follow-up chest radiograph showed persistent effusion and patient underwent chest tube placement with no major complications.  Assessment & Plan:   Principal Problem:   Lobar pneumonia (HCC) Active Problems:   Loculated pleural effusion   Essential hypertension   Dyslipidemia   Hypothyroid   Anxiety   Chronic anemia   Parapneumonic effusion  1. Right sided loculated effusion/ to ruled out empyema. (present on admission). Patient had a chest tube placed today with no major complications, continue to drain sanguinous fluid, analysis 2,800 to 963 PMN. Cultures with no growth, continue antibiotic therapy with Zosyn for now. Wbc is stable at 4,6 and patient has remained afebrile. Plan for tpa at the pleural space. Will continue pain control and oxygenation. Will dc doxycyline for now.   Chest tube with no air leak.  2, HTN. Continue blood pressure control with atenolol and  lisinopril.  3. Dyslipidemia. Continue statin therapy.  4. Hypothyroid. Continue levothyroxine.  5. Anemia/ chronic. Continue to follow up on cell count.  6. Anxiety. Continue as needed anxiolytics. On citalopram.     DVT prophylaxis: enoxaparin   Code Status:  full Family Communication: I spoke with patient's husband at the bedside, we talked in detail about patient's condition, plan of care and prognosis and all questions were addressed. Disposition Plan/ discharge barriers: pending resolution of right pleural effusion.    Nutrition Status:           Skin Documentation:     Consultants:   Pulmonary   Procedures:   Right thoracentesis   Right chest tube   Antimicrobials:       Subjective: Patient with right sided pleuritic chest pain, no nausea or vomiting, no dyspnea. Tolerating po well.   Objective: Vitals:   12/20/19 0412 12/20/19 1340 12/21/19 0504 12/21/19 1216  BP: (!) 165/82 (!) 145/71 (!) 152/72 (!) 180/96  Pulse: 62 63 62 61  Resp: 14 17 18    Temp: 98.2 F (36.8 C) 98.2 F (36.8 C) 98.9 F (37.2 C)   TempSrc: Oral Oral Oral   SpO2: 97% 97% 97%   Weight:      Height:        Intake/Output Summary (Last 24 hours) at 12/21/2019 1246 Last data filed at 12/21/2019 1119 Gross per 24 hour  Intake 520 ml  Output 1000 ml  Net -480 ml   Filed Weights   12/16/19 1357  Weight: 79.8 kg    Examination:   General: Not in pain or dyspnea. Deconditioned  Neurology: Awake and alert, non focal  E ENT: no pallor, no icterus,  oral mucosa moist Cardiovascular: No JVD. S1-S2 present, rhythmic, no gallops, rubs, or murmurs. No lower extremity edema. Pulmonary: positive breath sounds bilaterally, adequate air movement, no wheezing, rhonchi or rales. Gastrointestinal. Abdomen with no organomegaly, non tender, no rebound or guarding Skin. No rashes Musculoskeletal: no joint deformities Chest tube at the right side.     Data Reviewed: I have  personally reviewed following labs and imaging studies  CBC: Recent Labs  Lab 12/16/19 1400 12/16/19 1605 12/17/19 0307 12/18/19 0221 12/19/19 0548  WBC 7.7  --  5.3 4.2 4.6  NEUTROABS 5.2  --   --   --   --   HGB 10.9* 11.6* 9.3* 9.3* 9.9*  HCT 34.5* 34.0* 29.1* 30.0* 30.7*  MCV 84.6  --  83.4 84.7 82.5  PLT 150  --  147* 155 740   Basic Metabolic Panel: Recent Labs  Lab 12/16/19 1400 12/16/19 1605 12/17/19 0307 12/18/19 0221 12/19/19 0548  NA 133* 133* 137 140 141  K 3.8 3.6 3.6 3.5 3.6  CL 99 96* 103 107 105  CO2 26  --  26 26 30   GLUCOSE 108* 101* 127* 110* 95  BUN 11 12 8  5* <5*  CREATININE 0.97 1.00 0.88 0.75 0.90  CALCIUM 8.6*  --  8.0* 8.3* 8.6*   GFR: Estimated Creatinine Clearance: 59.4 mL/min (by C-G formula based on SCr of 0.9 mg/dL). Liver Function Tests: Recent Labs  Lab 12/16/19 1400 12/19/19 0548  AST 14* 12*  ALT 11 11  ALKPHOS 116 194*  BILITOT 1.1 0.7  PROT 6.6 5.6*  ALBUMIN 3.0* 2.3*   Recent Labs  Lab 12/16/19 1400  LIPASE 16   No results for input(s): AMMONIA in the last 168 hours. Coagulation Profile: No results for input(s): INR, PROTIME in the last 168 hours. Cardiac Enzymes: No results for input(s): CKTOTAL, CKMB, CKMBINDEX, TROPONINI in the last 168 hours. BNP (last 3 results) No results for input(s): PROBNP in the last 8760 hours. HbA1C: No results for input(s): HGBA1C in the last 72 hours. CBG: No results for input(s): GLUCAP in the last 168 hours. Lipid Profile: No results for input(s): CHOL, HDL, LDLCALC, TRIG, CHOLHDL, LDLDIRECT in the last 72 hours. Thyroid Function Tests: No results for input(s): TSH, T4TOTAL, FREET4, T3FREE, THYROIDAB in the last 72 hours. Anemia Panel: Recent Labs    12/20/19 1924  FERRITIN 84  TIBC 192*  IRON 35      Radiology Studies: I have reviewed all of the imaging during this hospital visit personally     Scheduled Meds: . alteplase (TPA) for intrapleural administration   10 mg Intrapleural Q12H   And  . pulmozyme (DORNASE) for intrapleural administration  5 mg Intrapleural Q12H  . aspirin EC  81 mg Oral QHS  . atenolol  50 mg Oral Daily  . citalopram  20 mg Oral Daily  . cycloSPORINE  1 drop Both Eyes BID  . doxycycline  100 mg Oral Q12H  . heparin  5,000 Units Subcutaneous Q8H  . levothyroxine  50 mcg Oral Daily  . lisinopril  5 mg Oral Daily  . loratadine  10 mg Oral Daily  . multivitamin with minerals  1 tablet Oral QHS  . oxybutynin  10 mg Oral Daily  . pantoprazole  40 mg Oral BID  . pregabalin  150 mg Oral BID  . rosuvastatin  10 mg Oral QHS   Continuous Infusions: . piperacillin-tazobactam (ZOSYN)  IV 3.375 g (12/21/19 0510)     LOS: 5  days        Simonne Boulos Gerome Apley, MD

## 2019-12-22 ENCOUNTER — Encounter (HOSPITAL_COMMUNITY): Payer: Self-pay | Admitting: Internal Medicine

## 2019-12-22 DIAGNOSIS — E785 Hyperlipidemia, unspecified: Secondary | ICD-10-CM

## 2019-12-22 DIAGNOSIS — J181 Lobar pneumonia, unspecified organism: Secondary | ICD-10-CM

## 2019-12-22 DIAGNOSIS — D649 Anemia, unspecified: Secondary | ICD-10-CM

## 2019-12-22 DIAGNOSIS — I10 Essential (primary) hypertension: Secondary | ICD-10-CM

## 2019-12-22 DIAGNOSIS — E039 Hypothyroidism, unspecified: Secondary | ICD-10-CM

## 2019-12-22 DIAGNOSIS — F419 Anxiety disorder, unspecified: Secondary | ICD-10-CM

## 2019-12-22 DIAGNOSIS — J918 Pleural effusion in other conditions classified elsewhere: Secondary | ICD-10-CM

## 2019-12-22 DIAGNOSIS — J189 Pneumonia, unspecified organism: Secondary | ICD-10-CM

## 2019-12-22 MED ORDER — SODIUM CHLORIDE 0.9 % IV BOLUS
500.0000 mL | Freq: Once | INTRAVENOUS | Status: AC
  Administered 2019-12-22: 500 mL via INTRAVENOUS

## 2019-12-22 MED ORDER — PNEUMOCOCCAL VAC POLYVALENT 25 MCG/0.5ML IJ INJ
0.5000 mL | INJECTION | INTRAMUSCULAR | Status: DC
  Filled 2019-12-22: qty 0.5

## 2019-12-22 MED ORDER — SODIUM CHLORIDE (PF) 0.9 % IJ SOLN
10.0000 mg | Freq: Two times a day (BID) | INTRAMUSCULAR | Status: DC
  Administered 2019-12-22: 10 mg via INTRAPLEURAL
  Filled 2019-12-22 (×3): qty 10

## 2019-12-22 MED ORDER — STERILE WATER FOR INJECTION IJ SOLN
5.0000 mg | Freq: Two times a day (BID) | RESPIRATORY_TRACT | Status: DC
  Administered 2019-12-22: 5 mg via INTRAPLEURAL
  Filled 2019-12-22 (×3): qty 5

## 2019-12-22 MED ORDER — SODIUM CHLORIDE (PF) 0.9 % IJ SOLN
10.0000 mg | Freq: Two times a day (BID) | INTRAMUSCULAR | Status: DC
  Filled 2019-12-22 (×2): qty 10

## 2019-12-22 MED ORDER — STERILE WATER FOR INJECTION IJ SOLN
5.0000 mg | Freq: Two times a day (BID) | RESPIRATORY_TRACT | Status: DC
  Filled 2019-12-22 (×2): qty 5

## 2019-12-22 NOTE — Progress Notes (Signed)
Patient's BP 72/47, HR 57. Patient is asymptomatic. MD paged to be notified. Awaiting response.

## 2019-12-22 NOTE — Progress Notes (Signed)
PROGRESS NOTE    [place]  DQQ:[id number] DOB: 1951/01/09 DOA: 12/16/2019 PCP: Maurice Small, MD    Brief Narrative:  69 year old female who presented with chest pain, she does have significant past medical history for chronic kidney disease, hypertension, dyslipidemia and recent cholecystitis, status post cholecystostomy tube.  About 10 days prior to hospitalization cholecystostomy tube was removed, 8 days later she developed right upper quadrant abdominal pain, radiated to the flank region, worse with deep breathing.  On her initial physical examination blood pressure was 138/69, heart rate 66, respirate 16, oxygen saturation 95%, her lungs had decreased breath sounds in the right lower field, heart S1-S2 present rhythmic, abdomen nontender, soft, lower extremities no edema. CT scan showed loculated right pleural effusion.   Patient was admitted to the hospital working diagnosis of new right pleural effusion to rule out empyema.   Patient underwent pleural drainage, 800 mL of amber/ sanguinous fluid, with 2,800 to 963 PMN .  Follow-up chest radiograph showed persistent effusion and patient underwent 12/21/19 chest tube placement with no major complications.   Patient responding well to chest tube drainage plus tpa to the pleural space, fluid analysis consistent with complicated para-pneumonic effusion.    Assessment & Plan:   Principal Problem:   Lobar pneumonia (HCC) Active Problems:   Loculated pleural effusion   Essential hypertension   Dyslipidemia   Hypothyroid   Anxiety   Chronic anemia   Parapneumonic effusion   1. Right sided loculated effusion/ community acquired pneumonia with complicated parapneumonic effusion. (present on admission). Cultures with no growth.   Chest tube with no air leak, output over last 24 h 1,020 ml. Continue tpa to the plural space per pulmonary recommendations. Follow up chest film personally reviewed with resolving pleural  effusion on the right.   Cultures with no growth. Continue with Zosyn #2/8 and follow cell count in am. Continue with bronchodilators.   Continue pain control and physical therapy. Out of bed as tolerated.   2, HTN/ hypotension. Will add 500 cc ns bolus x2 for hypotension, blood pressure systolic 72 to 96, will hold on lisinopril for now. Will continue atenolol for now.   3. Dyslipidemia. Continue with rosuvastatin.   4. Hypothyroid. On levothyroxine.  5. Anemia/ chronic. Hgb has been stable at 9,9 and hct at 30,7. Follow cell count in am.   6. Anxiety. Continue with citalopram.     DVT prophylaxis: enoxaparin   Code Status:  full Family Communication: no family at the bedside Disposition Plan/ discharge barriers: pending resolution of right pleural effusion   Consultants:   Pulmonary   Procedures:   Thoracentesis right   Chest tune placement right   Antimicrobials:   Zosyn     Subjective: Patient feeling very weak and deconditioned, tolerating po well, no nausea or vomiting, no significant pain at the site of the chest tube. Had hypotension in the pm.   Objective: Vitals:   12/21/19 1404 12/21/19 2047 12/22/19 0423 12/22/19 1422  BP: (!) 150/71 112/61 (!) 108/56 (!) 72/47  Pulse: 71 70 62 68  Resp: 19 18 18 18   Temp: 98 F (36.7 C) 98.8 F (37.1 C) 98.1 F (36.7 C) 97.8 F (36.6 C)  TempSrc: Axillary Oral Oral Oral  SpO2: 97% 96% 95% 96%  Weight:      Height:        Intake/Output Summary (Last 24 hours) at 12/22/2019 1506 Last data filed at 12/22/2019 1006 Gross per 24 hour  Intake 909.11 ml  Output 2520 ml  Net -1610.89 ml   Filed Weights   12/16/19 1357  Weight: 79.8 kg    Examination:   General: Not in pain or dyspnea. Deconditioned and ill looking appearing.  Neurology: Awake and alert, non focal  E ENT: mild pallor, no icterus, oral mucosa moist Cardiovascular: No JVD. S1-S2 present, rhythmic, no gallops, rubs, or murmurs. No  lower extremity edema. Pulmonary: positive breath sounds bilaterally, decreased breath sounds at the right base, poor ventilation, anterior chest auscultation.  Gastrointestinal. Abdomen with no organomegaly, non tender, no rebound or guarding Skin. No rashes Musculoskeletal: no joint deformities     Data Reviewed: I have personally reviewed following labs and imaging studies  CBC: Recent Labs  Lab 12/16/19 1400 12/16/19 1605 12/17/19 0307 12/18/19 0221 12/19/19 0548  WBC 7.7  --  5.3 4.2 4.6  NEUTROABS 5.2  --   --   --   --   HGB 10.9* 11.6* 9.3* 9.3* 9.9*  HCT 34.5* 34.0* 29.1* 30.0* 30.7*  MCV 84.6  --  83.4 84.7 82.5  PLT 150  --  147* 155 190   Basic Metabolic Panel: Recent Labs  Lab 12/16/19 1400 12/16/19 1605 12/17/19 0307 12/18/19 0221 12/19/19 0548  NA 133* 133* 137 140 141  K 3.8 3.6 3.6 3.5 3.6  CL 99 96* 103 107 105  CO2 26  --  26 26 30   GLUCOSE 108* 101* 127* 110* 95  BUN 11 12 8  5* <5*  CREATININE 0.97 1.00 0.88 0.75 0.90  CALCIUM 8.6*  --  8.0* 8.3* 8.6*   GFR: Estimated Creatinine Clearance: 59.4 mL/min (by C-G formula based on SCr of 0.9 mg/dL). Liver Function Tests: Recent Labs  Lab 12/16/19 1400 12/19/19 0548  AST 14* 12*  ALT 11 11  ALKPHOS 116 194*  BILITOT 1.1 0.7  PROT 6.6 5.6*  ALBUMIN 3.0* 2.3*   Recent Labs  Lab 12/16/19 1400  LIPASE 16   No results for input(s): AMMONIA in the last 168 hours. Coagulation Profile: No results for input(s): INR, PROTIME in the last 168 hours. Cardiac Enzymes: No results for input(s): CKTOTAL, CKMB, CKMBINDEX, TROPONINI in the last 168 hours. BNP (last 3 results) No results for input(s): PROBNP in the last 8760 hours. HbA1C: No results for input(s): HGBA1C in the last 72 hours. CBG: No results for input(s): GLUCAP in the last 168 hours. Lipid Profile: No results for input(s): CHOL, HDL, LDLCALC, TRIG, CHOLHDL, LDLDIRECT in the last 72 hours. Thyroid Function Tests: No results for  input(s): TSH, T4TOTAL, FREET4, T3FREE, THYROIDAB in the last 72 hours. Anemia Panel: Recent Labs    12/20/19 1924  FERRITIN 84  TIBC 192*  IRON 35      Radiology Studies: I have reviewed all of the imaging during this hospital visit personally     Scheduled Meds: . alteplase (TPA) for intrapleural administration  10 mg Intrapleural Q12H   And  . pulmozyme (DORNASE) for intrapleural administration  5 mg Intrapleural Q12H  . aspirin EC  81 mg Oral QHS  . atenolol  50 mg Oral Daily  . citalopram  20 mg Oral Daily  . cycloSPORINE  1 drop Both Eyes BID  . heparin  5,000 Units Subcutaneous Q8H  . levothyroxine  50 mcg Oral Daily  . loratadine  10 mg Oral Daily  . multivitamin with minerals  1 tablet Oral QHS  . oxybutynin  10 mg Oral Daily  . pantoprazole  40 mg Oral BID  . [  START ON 12/23/2019] pneumococcal 23 valent vaccine  0.5 mL Intramuscular Tomorrow-1000  . pregabalin  150 mg Oral BID  . rosuvastatin  10 mg Oral QHS   Continuous Infusions: . piperacillin-tazobactam (ZOSYN)  IV 3.375 g (12/22/19 1317)     LOS: 6 days        Montravious Weigelt Gerome Apley, MD

## 2019-12-22 NOTE — Progress Notes (Signed)
Pt out of trendelenberg at 1940. Chest tube lock opened at 2010.

## 2019-12-22 NOTE — Plan of Care (Signed)
  Problem: Pain Managment: Goal: General experience of comfort will improve Outcome: Progressing   Problem: Safety: Goal: Ability to remain free from injury will improve Outcome: Progressing   Problem: Skin Integrity: Goal: Risk for impaired skin integrity will decrease Outcome: Progressing   

## 2019-12-22 NOTE — Plan of Care (Signed)
4th dose of intrapleural lytics given at 7:10. To be moved out of trendelemburg at 7:40 and released at 8:10. Discussed with bedside RN.  Steffanie Dunn, DO 12/22/19 7:24 PM [facility]

## 2019-12-22 NOTE — Progress Notes (Signed)
Physical Therapy Treatment Patient Details Name: Suzanne Harrison MRN: [id number] DOB: [birth year] Today's Date: 12/22/2019    History of Present Illness Patient is a 69 year old female with PMH significant for HTN, HL who was recently discharged 2 weeks ago after treatment for acute cholecystitis. Pt returns with R quadrant pain and loculated pleural effusion. Thoracentesis performed 1/18.    PT Comments    Pt was able to increase ambulation distance today despite pain from check tube insertion. She is mobilizing well and progressing towards goals. Will continue to follow acutely.    Follow Up Recommendations  No PT follow up     Equipment Recommendations  None recommended by PT    Recommendations for Other Services       Precautions / Restrictions Precautions Precautions: None Restrictions Weight Bearing Restrictions: No    Mobility  Bed Mobility Overal bed mobility: Modified Independent                Transfers Overall transfer level: Needs assistance Equipment used: Straight cane;None Transfers: Sit to/from Stand Sit to Stand: Supervision         General transfer comment: supervision for safety as pt had just had chect tube placed  Ambulation/Gait Ambulation/Gait assistance: Supervision Gait Distance (Feet): 400 Feet Assistive device: Straight cane Gait Pattern/deviations: Antalgic;Trunk flexed;Step-through pattern Gait velocity: decreased   General Gait Details: Mildly antalgic gait. Pt stumbled with first steps, stating that her shoe caught on something. She was able to correct w/o assist and no additional LOB noted.   Stairs             Wheelchair Mobility    Modified Rankin (Stroke Patients Only)       Balance Overall balance assessment: Mild deficits observed, not formally tested;Needs assistance                                          Cognition Arousal/Alertness: Awake/alert Behavior During Therapy: WFL  for tasks assessed/performed Overall Cognitive Status: Within Functional Limits for tasks assessed                                        Exercises      General Comments General comments (skin integrity, edema, etc.): pt up in room on arrival with NT for toileting. Afterwards pt seemed to have moments of confusion, attempting to lay back in bed after being told to stay EOB to begin therapy. RN present and administering morning meds. VSS and pt orientated.      Pertinent Vitals/Pain Pain Assessment: 0-10 Faces Pain Scale: Hurts whole lot Pain Location: RUQ with inhale Pain Descriptors / Indicators: Sharp Pain Intervention(s): Monitored during session;Limited activity within patient's tolerance;Repositioned    Home Living                      Prior Function            PT Goals (current goals can now be found in the care plan section) Acute Rehab PT Goals Patient Stated Goal: return home PT Goal Formulation: With patient Time For Goal Achievement: 01/02/20 Potential to Achieve Goals: Good Progress towards PT goals: Progressing toward goals    Frequency    Min 3X/week      PT Plan Current plan remains appropriate  Co-evaluation              AM-PAC PT "6 Clicks" Mobility   Outcome Measure  Help needed turning from your back to your side while in a flat bed without using bedrails?: None Help needed moving from lying on your back to sitting on the side of a flat bed without using bedrails?: None Help needed moving to and from a bed to a chair (including a wheelchair)?: None Help needed standing up from a chair using your arms (e.g., wheelchair or bedside chair)?: A Little Help needed to walk in hospital room?: A Little Help needed climbing 3-5 steps with a railing? : A Little 6 Click Score: 21    End of Session   Activity Tolerance: Patient tolerated treatment well Patient left: with call bell/phone within reach;in chair Nurse  Communication: Mobility status PT Visit Diagnosis: Pain;Difficulty in walking, not elsewhere classified (R26.2) Pain - Right/Left: Right Pain - part of body: (upper quadrant)     Time: 0721-8288 PT Time Calculation (min) (ACUTE ONLY): 40 min  Charges:  $Gait Training: 38-52 mins                     Suzanne Harrison, Suzanne Harrison Pager [phone] Acute Rehab   Suzanne Harrison 12/22/2019, 10:50 AM

## 2019-12-22 NOTE — Progress Notes (Signed)
NAME:  Suzanne Harrison, MRN:  [id number], DOB:  1951-10-31, LOS: 6 ADMISSION DATE:  12/16/2019, CONSULTATION DATE:  12/20/19 REFERRING MD:  Dyanne Iha  CHIEF COMPLAINT:  Dyspnea, pleuritic pain  Brief History   Suzanne Harrison is a 69 y.o. female who was admitted 1/16 with right pleuritic pain and mild dyspnea.  Had acute cholecystitis in Nov 2020 where lap chole was attempted but not successful.  Ultimately had perc chole placed 11/4 and removed 11/13. Returned 1/16 with above symptoms.  Had thora by IR with 886ml removed.  Analysis suggestive of empyema. PCCM asked to see 1/20 for further recs.  History of present illness   Suzanne Harrison is a 69 y.o. female who has a PMH including but not limited to HTN, HLD (see "past medical history" for rest).  She had recent admission 10/01/19 through 10/05/20 for acute cholecystitis.  She had unsuccessful lap chole on 10/03/19 secondary to adherence of GB to bowel and subsequently had per chole tube placed by IR on 10/04/19.  She was discharged home on 10/06/19 and had tube removed on 11/13.    She then presented to [facility] ED 1/16 with right sided pleuritic chest pain and dyspnea.  CT A / P demonstrated loculated pleural effusion.  There was some concern for fluid communication subdiaphragmatically with possible biliary leak; however, after being evaluated by surgery, it was not felt to be the case.  She had right thora performed by IR on 1/19 and had 849ml of amber fluid removed.  Labs suggestive of infection / empyema (2880 WBCs with 60% neuts).  She was initially started on doxy and later changed to zosyn.  PCCM asked to see in consultation 1/20 for further recs.  Bedside POCUS performed which showed large fluid pocket on the right with some atelectatic lung flaps inferomedially.   We have requested dedicated CT chest to be performed for further evaluation of effusion characteristics.  Past Medical History  has Hip arthritis; History of total hip  arthroplasty; Referred ear pain; Acute cholecystitis; and Loculated pleural effusion on their problem list.  Significant Hospital Events   1/16 > admit. 1/19 > R thora by IR with 880ml amber fluid removed (2880 WBCs with 60% neuts). 1/20 > PCCM consulted.  Consults:  IR, surgery, PCCM  Procedures:  1/19 > R thora  Significant Diagnostic Tests:  CT A / P 1/16 > Loculated right effusion with some questionable pleural thickening CTA chest 1/20 > multiple loculated effusions, not all contiguous throughout the entire posterior hemithorax on the right  Micro Data:  Blood 1/20 >  R pleural fluid 1/19 > WBCs, no organisms SARS CoV2 1/16 >  R pleural fluid 1/21>> WBCs, no organisms  Antimicrobials:  Doxy 1/16 > 1/20. Zosyn 1/20 >    Interim history/subjective:  Having some pain from chest tube. No new complaints.  Objective:  Blood pressure (!) 108/56, pulse 62, temperature 98.1 F (36.7 C), temperature source Oral, resp. rate 18, height 5\' 2"  (1.575 m), weight 79.8 kg, SpO2 95 %.        Intake/Output Summary (Last 24 hours) at 12/22/2019 3662 Last data filed at 12/22/2019 0757 Gross per 24 hour  Intake 1246.11 ml  Output 2520 ml  Net -1273.89 ml   Filed Weights   12/16/19 1357  Weight: 79.8 kg    Examination: General: elderly woman laying in bed in NAD, comfortable-appearing laying in bed reading Neuro: awake, alert, globally weak requiring assistance with sitting forward in bed, but  no focal deficits HEENT: [place]/AT, eyes anicteric Cardiovascular: RRR, no murmurs Lungs: rhales on the right, CTA on the left. Breathing comfortably on RA. Chest tube on the right with serosanguinous drainage. 1100 cc fluid out over 24 hours Abdomen: soft, NT, ND Musculoskeletal: no edema, clubbing or cyanosis Skin: no rashes or wounds. No erythema or swelling around chest tube insertion site.  Assessment & Plan:   Right sided loculated pleural effusion. Thora 1/19 with complicated  parapneumonic effusion characteristics (2880 WBC's with neutrophil predominance, no additional studies sent).  Small bore chest tube placed 11/21 -TPA & dornase BID x 6 doses (doses 3-4 today). Needs to be in [place] for part of time meds are instilled to reach apical pleural space where the predominance of her loculations are. She has tolerated this well so far. Can follow up with CT chest to monitor for residual disease after completion of meds. -follow cultures -con't zosyn -Norco PRN for pain control -Recommend out of bed mobility when not in bed for chest tube administration. -IS  Best Practice:  Diet: Regular. Pain/Anxiety/Delirium protocol (if indicated): N/A. VAP protocol (if indicated): N/A. DVT prophylaxis: SCD's / Heparin. GI prophylaxis: N/A. Glucose control: Per primary. Mobility: Per primary. Code Status: Full. Family Communication: Per primary. Disposition: Med Surg  Labs   CBC: Recent Labs  Lab 12/16/19 1400 12/16/19 1605 12/17/19 0307 12/18/19 0221 12/19/19 0548  WBC 7.7  --  5.3 4.2 4.6  NEUTROABS 5.2  --   --   --   --   HGB 10.9* 11.6* 9.3* 9.3* 9.9*  HCT 34.5* 34.0* 29.1* 30.0* 30.7*  MCV 84.6  --  83.4 84.7 82.5  PLT 150  --  147* 155 190   Basic Metabolic Panel: Recent Labs  Lab 12/16/19 1400 12/16/19 1605 12/17/19 0307 12/18/19 0221 12/19/19 0548  NA 133* 133* 137 140 141  K 3.8 3.6 3.6 3.5 3.6  CL 99 96* 103 107 105  CO2 26  --  26 26 30   GLUCOSE 108* 101* 127* 110* 95  BUN 11 12 8  5* <5*  CREATININE 0.97 1.00 0.88 0.75 0.90  CALCIUM 8.6*  --  8.0* 8.3* 8.6*   GFR: Estimated Creatinine Clearance: 59.4 mL/min (by C-G formula based on SCr of 0.9 mg/dL). Recent Labs  Lab 12/16/19 1400 12/17/19 0307 12/18/19 0221 12/19/19 0548  PROCALCITON  --  <0.10  --   --   WBC 7.7 5.3 4.2 4.6   Liver Function Tests: Recent Labs  Lab 12/16/19 1400 12/19/19 0548  AST 14* 12*  ALT 11 11  ALKPHOS 116 194*  BILITOT 1.1 0.7  PROT 6.6  5.6*  ALBUMIN 3.0* 2.3*   Recent Labs  Lab 12/16/19 1400  LIPASE 16   No results for input(s): AMMONIA in the last 168 hours. ABG    Component Value Date/Time   TCO2 26 12/16/2019 1605    Coagulation Profile: No results for input(s): INR, PROTIME in the last 168 hours. Cardiac Enzymes: No results for input(s): CKTOTAL, CKMB, CKMBINDEX, TROPONINI in the last 168 hours. HbA1C: No results found for: HGBA1C CBG: No results for input(s): GLUCAP in the last 168 hours.    12/18/19, DO 12/22/19 8:30 AM [facility]

## 2019-12-23 DIAGNOSIS — J181 Lobar pneumonia, unspecified organism: Principal | ICD-10-CM

## 2019-12-23 LAB — CULTURE, BODY FLUID W GRAM STAIN -BOTTLE: Culture: NO GROWTH

## 2019-12-23 LAB — CBC WITH DIFFERENTIAL/PLATELET
Abs Immature Granulocytes: 0.06 10*3/uL (ref 0.00–0.07)
Basophils Absolute: 0 10*3/uL (ref 0.0–0.1)
Basophils Relative: 0 %
Eosinophils Absolute: 0.5 10*3/uL (ref 0.0–0.5)
Eosinophils Relative: 6 %
HCT: 34.7 % — ABNORMAL LOW (ref 36.0–46.0)
Hemoglobin: 10.9 g/dL — ABNORMAL LOW (ref 12.0–15.0)
Immature Granulocytes: 1 %
Lymphocytes Relative: 21 %
Lymphs Abs: 1.7 10*3/uL (ref 0.7–4.0)
MCH: 26.1 pg (ref 26.0–34.0)
MCHC: 31.4 g/dL (ref 30.0–36.0)
MCV: 83.2 fL (ref 80.0–100.0)
Monocytes Absolute: 0.6 10*3/uL (ref 0.1–1.0)
Monocytes Relative: 8 %
Neutro Abs: 5.1 10*3/uL (ref 1.7–7.7)
Neutrophils Relative %: 64 %
Platelets: 301 10*3/uL (ref 150–400)
RBC: 4.17 MIL/uL (ref 3.87–5.11)
RDW: 15.1 % (ref 11.5–15.5)
WBC: 8 10*3/uL (ref 4.0–10.5)
nRBC: 0 % (ref 0.0–0.2)

## 2019-12-23 LAB — BASIC METABOLIC PANEL
Anion gap: 8 (ref 5–15)
BUN: 10 mg/dL (ref 8–23)
CO2: 25 mmol/L (ref 22–32)
Calcium: 8.3 mg/dL — ABNORMAL LOW (ref 8.9–10.3)
Chloride: 104 mmol/L (ref 98–111)
Creatinine, Ser: 1.11 mg/dL — ABNORMAL HIGH (ref 0.44–1.00)
GFR calc Af Amer: 60 mL/min — ABNORMAL LOW (ref >60)
GFR calc non Af Amer: 51 mL/min — ABNORMAL LOW (ref >60)
Glucose, Bld: 109 mg/dL — ABNORMAL HIGH (ref 70–99)
Potassium: 3.9 mmol/L (ref 3.5–5.1)
Sodium: 137 mmol/L (ref 135–145)

## 2019-12-23 MED ORDER — SODIUM CHLORIDE (PF) 0.9 % IJ SOLN
10.0000 mg | Freq: Once | INTRAMUSCULAR | Status: AC
  Administered 2019-12-23: 10 mg via INTRAPLEURAL
  Filled 2019-12-23: qty 10

## 2019-12-23 MED ORDER — STERILE WATER FOR INJECTION IJ SOLN
5.0000 mg | Freq: Once | RESPIRATORY_TRACT | Status: AC
  Administered 2019-12-23: 5 mg via INTRAPLEURAL
  Filled 2019-12-23: qty 5

## 2019-12-23 NOTE — Evaluation (Signed)
Occupational Therapy Evaluation Patient Details Name: Suzanne Harrison MRN: [id number] DOB: 1951/03/02 Today's Date: 12/23/2019    History of Present Illness Patient is a 69 year old female with PMH significant for HTN, HL who was recently discharged 2 weeks ago after treatment for acute cholecystitis. Pt returns with R quadrant pain and loculated pleural effusion. Thoracentesis performed 1/18.   Clinical Impression   Pt admitted with see above. Pt currently with functional limitations due to the deficits listed below (see OT Problem List). Pt was limited by pain and BP.  Pt planning to return home with family who can assist as needed. Patient educated about long handle reacher for LE dressing. Patient completed supine to sitting and sitting to standing with supervision to min guard. Pt was able to ambulate to bathroom with SPC and min guard.  Pt will benefit from skilled OT to increase their safety and independence with ADL and functional mobility for ADL to facilitate discharge to venue listed below.   HR 60-63 O2 90-93% BP- 99/45-106/53      Follow Up Recommendations  No OT follow up;Supervision - Intermittent    Equipment Recommendations       Recommendations for Other Services       Precautions / Restrictions Precautions Precautions: None Restrictions Weight Bearing Restrictions: No      Mobility Bed Mobility Overal bed mobility: Modified Independent Bed Mobility: Rolling;Sidelying to Sit Rolling: Modified independent (Device/Increase time) Sidelying to sit: Supervision          Transfers Overall transfer level: Needs assistance Equipment used: Straight cane;None Transfers: Sit to/from Stand Sit to Stand: Supervision         General transfer comment: supervision for safety as pt had just had chect tube placed    Balance Overall balance assessment: Mild deficits observed, not formally tested;Needs assistance                                          ADL either performed or assessed with clinical judgement   ADL Overall ADL's : Needs assistance/impaired Eating/Feeding: Independent;Sitting   Grooming: Wash/dry hands;Wash/dry face;Supervision/safety   Upper Body Bathing: Supervision/ safety   Lower Body Bathing: Minimal assistance;Sit to/from stand   Upper Body Dressing : Supervision/safety;Sitting   Lower Body Dressing: Minimal assistance;Sit to/from stand   Toilet Transfer: Min guard;Grab bars   Toileting- Architect and Hygiene: Supervision/safety;Sit to/from stand   Tub/ Engineer, structural: Supervision/safety   Functional mobility during ADLs: Min guard;Cueing for safety;Cueing for sequencing       Vision Patient Visual Report: No change from baseline Vision Assessment?: No apparent visual deficits     Perception Perception Perception Tested?: No   Praxis Praxis Praxis tested?: Not tested    Pertinent Vitals/Pain Pain Assessment: 0-10 Pain Score: 4  Pain Location: RUQ with inhale Pain Descriptors / Indicators: Sharp Pain Intervention(s): Limited activity within patient's tolerance     Hand Dominance Right   Extremity/Trunk Assessment Upper Extremity Assessment Upper Extremity Assessment: Overall WFL for tasks assessed   Lower Extremity Assessment Lower Extremity Assessment: Defer to PT evaluation       Communication Communication Communication: No difficulties   Cognition Arousal/Alertness: Awake/alert Behavior During Therapy: WFL for tasks assessed/performed Overall Cognitive Status: Within Functional Limits for tasks assessed  General Comments       Exercises     Shoulder Instructions      Home Living Family/patient expects to be discharged to:: Private residence Living Arrangements: Spouse/significant other Available Help at Discharge: Family;Available 24 hours/day Type of Home: House Home Access: Stairs to  enter [employer] of Steps: 4 Entrance Stairs-Rails: Right;Left Home Layout: Able to live on main level with bedroom/bathroom     Bathroom Shower/Tub: Walk-in shower;Tub/shower unit   Bathroom Toilet: Standard Bathroom Accessibility: Yes How Accessible: Accessible via walker Home Equipment: [place] - 2 wheels;Cane - single point;Bedside commode   Additional Comments: knee scooter      Prior Functioning/Environment Level of Independence: Independent with assistive device(s)        Comments: cane or no AD        OT Problem List: Decreased strength;Decreased activity tolerance;Decreased knowledge of use of DME or AE;Decreased safety awareness;Pain      OT Treatment/Interventions: Self-care/ADL training;DME and/or AE instruction;Therapeutic activities;Patient/family education;Balance training    OT Goals(Current goals can be found in the care plan section) Acute Rehab OT Goals Patient Stated Goal: return home OT Goal Formulation: With patient Time For Goal Achievement: 12/30/19 Potential to Achieve Goals: Good ADL Goals Pt Will Perform Lower Body Dressing: with supervision;sit to/from stand Additional ADL Goal #1: Pt will complete 5 mins of standing ADLS with no LOB  OT Frequency: Min 2X/week   Barriers to D/C:            Co-evaluation              AM-PAC OT "6 Clicks" Daily Activity     Outcome Measure Help from another person eating meals?: None Help from another person taking care of personal grooming?: None Help from another person toileting, which includes using toliet, bedpan, or urinal?: None Help from another person bathing (including washing, rinsing, drying)?: A Little Help from another person to put on and taking off regular upper body clothing?: None Help from another person to put on and taking off regular lower body clothing?: A Little 6 Click Score: 22   End of Session Equipment Utilized During Treatment: Gait belt  Activity  Tolerance: Patient tolerated treatment well Patient left: in bed;with call bell/phone within reach  OT Visit Diagnosis: Unsteadiness on feet (R26.81);Muscle weakness (generalized) (M62.81);Pain Pain - Right/Left: Right Pain - part of body: (chest)                Time: 2725-3664 OT Time Calculation (min): 39 min Charges:  OT General Charges $OT Visit: 1 Visit OT Evaluation $OT Eval Low Complexity: 1 Low OT Treatments $Self Care/Home Management : 23-37 mins  Joeseph Amor OTR/L  Acute Rehab Services  [phone] office number [phone] pager number   Joeseph Amor 12/23/2019, 12:36 PM

## 2019-12-23 NOTE — Procedures (Signed)
Procedure: instillation of pleural fibrinolytics Indication: loculated pleural effusion Description: 10mg  of tPA in 30cc of saline and 5mg  of dornase in 30cc of sterile water were injected into pleural space using existing pleural catheter.   Catheter will be clamped for 1 hour and then back to suction. Complications: none immediate  MD

## 2019-12-23 NOTE — Progress Notes (Signed)
NAME:  Suzanne Harrison, MRN:  [id number], DOB:  01/31/1951, LOS: 7 ADMISSION DATE:  12/16/2019, CONSULTATION DATE:  12/20/19 REFERRING MD:  Garner Gavel  CHIEF COMPLAINT:  Dyspnea, pleuritic pain  Brief History   Suzanne Harrison is a 69 y.o. female who was admitted 1/16 with right pleuritic pain and mild dyspnea.  Had acute cholecystitis in Nov 2020 where lap chole was attempted but not successful.  Ultimately had perc chole placed 11/4 and removed 11/13. Returned 1/16 with above symptoms.  Had thora by IR with removed.  Analysis suggestive of empyema. PCCM asked to see 1/20 for further recs.  History of present illness   Suzanne Harrison is a 69 y.o. female who has a PMH including but not limited to HTN, HLD (see "past medical history" for rest).  She had recent admission 10/01/19 through 10/05/20 for acute cholecystitis.  She had unsuccessful lap chole on 10/03/19 secondary to adherence of GB to bowel and subsequently had per chole tube placed by IR on 10/04/19.  She was discharged home on 10/06/19 and had tube removed on 11/13.    She then presented to [facility] ED 1/16 with right sided pleuritic chest pain and dyspnea.  CT A / P demonstrated loculated pleural effusion.  There was some concern for fluid communication subdiaphragmatically with possible biliary leak; however, after being evaluated by surgery, it was not felt to be the case.  She had right thora performed by IR on 1/19 and had of amber fluid removed.  Labs suggestive of infection / empyema (2880 WBCs with 60% neuts).  She was initially started on doxy and later changed to zosyn.  PCCM asked to see in consultation 1/20 for further recs.  Bedside POCUS performed which showed large fluid pocket on the right with some atelectatic lung flaps inferomedially.   We have requested dedicated CT chest to be performed for further evaluation of effusion characteristics.  Past Medical History  has Hip arthritis; History of total hip  arthroplasty; Referred ear pain; Acute cholecystitis; Loculated pleural effusion; Essential hypertension; Dyslipidemia; Hypothyroid; Anxiety; Chronic anemia; Parapneumonic effusion; and Lobar pneumonia (HCC) on their problem list.  Significant Hospital Events   1/16 > admit. 1/19 > R thora by IR with amber fluid removed (2880 WBCs with 60% neuts). 1/20 > PCCM consulted.  Consults:  IR, surgery, PCCM  Procedures:  1/19 > R thora  Significant Diagnostic Tests:  CT A / P 1/16 > Loculated right effusion with some questionable pleural thickening CTA chest 1/20 > multiple loculated effusions, not all contiguous throughout the entire posterior hemithorax on the right  Micro Data:  Blood 1/20 >  R pleural fluid 1/19 > WBCs, no organisms SARS CoV2 1/16 >  R pleural fluid 1/21>> WBCs, no organisms  Antimicrobials:  Doxy 1/16 > 1/20. Zosyn 1/20 >    Interim history/subjective:  No events.  Walked to bathroom but had low BP with this.  770 chest output over 24h.  Objective:  Blood pressure (!) 107/51, pulse 60, temperature (P) 97.9 F (36.6 C), temperature source (P) Oral, resp. rate 16, height 5\' 2"  (1.575 m), weight 79.8 kg, SpO2 98 %.        Intake/Output Summary (Last 24 hours) at 12/23/2019 1545 Last data filed at 12/23/2019 1356 Gross per 24 hour  Intake 993 ml  Output 2921 ml  Net -1928 ml   Filed Weights   12/16/19 1357  Weight: 79.8 kg    Examination: GEN: chronically ill  woman in NAD HEENT: MMM, trachea midline CV: RRR, ext warm PULM: Slightly diminished on R, chest tube in place with straw colored output GI: Soft, +BS EXT: No edema NEURO: Moves all 4 ext to command, globally weak PSYCH: AOx3, fair insight SKIN: No rashes   Assessment & Plan:   Right complex effusion, parapneumonic vs. Reactive from recent abdominal manipulation. - tpa/dornase x 1 today - AM CXR, if looks okay pull tube - progressive mobility  Erskine Emery MD PCCM

## 2019-12-23 NOTE — Progress Notes (Signed)
PROGRESS NOTE    [place] Philipps  UXN:[id number] DOB: 21-Feb-1951 DOA: 12/16/2019 PCP: Maurice Small, MD    Brief Narrative:  69 year old female with past medical history significant for but not limited CKD, hypertension dyslipidemia.  She had cholecystostomy tube placement for cholecystitis in November 2020, with tube removal 10 days prior to presentation with right upper quadrant abdominal pain worse with deep breathing.CT scan showed loculated right pleural effusion for which reason she was admitted to the hospital.   Further evaluation with fluid analysis consistent with complicated para-pneumonic effusion.    Assessment & Plan:   Principal Problem:   Lobar pneumonia (HCC) Active Problems:   Loculated pleural effusion   Essential hypertension   Dyslipidemia   Hypothyroid   Anxiety   Chronic anemia   Parapneumonic effusion   1. Right sided loculated effusion/ community acquired pneumonia with complicated parapneumonic effusion. (present on admission).  Cultures with no growth.  Chest tube with no air leak, output over last 24 h 1,020 ml.  Tpa to the plural space per pulmonary recommendations.  Follow up chest filmwith resolving pleural effusion on the right.  WBC within normal limits Clarified to pharmacist-continue with Zosyn #8 12/23/2019 -continue with 2 more days to make a total of 10 days of IV antibiotic treatment  Continue with bronchodilators.  Continue pain control and physical therapy.  Out of bed as tolerated.   2, HTN/ hypotension. Blood pressure soft-atenolol and lisinopril on hold currently-as needed normal saline boluses as needed 3. Dyslipidemia.  Continue with rosuvastatin.   4. Hypothyroid.  On levothyroxine.  5. Anemia/ chronic.  Hgb has been stable at 9,9 and hct at 30,7. Follow cell count in am.   6. Anxiety.  Continue with citalopram.     DVT prophylaxis: enoxaparin   Code Status:  full Family Communication: no family at  the bedside Disposition Plan/ discharge barriers: pending resolution of right pleural effusion   Consultants:   Pulmonary   Procedures:   Thoracentesis right   Chest tune placement right   Antimicrobials:   Zosyn     Subjective: No fever or chills  Objective: Vitals:   12/22/19 2214 12/23/19 0210 12/23/19 0500 12/23/19 1029  BP: (!) 117/53 (!) 112/47 (!) 102/49 (!) 107/51  Pulse: 66 63 (!) 59 60  Resp: 15 17 15 16   Temp: 98.1 F (36.7 C) 98.2 F (36.8 C) 97.9 F (36.6 C) 97.7 F (36.5 C)  TempSrc: Oral Oral Oral Oral  SpO2: 93% 93% 93% 98%  Weight:      Height:        Intake/Output Summary (Last 24 hours) at 12/23/2019 1258 Last data filed at 12/23/2019 12/25/2019 Gross per 24 hour  Intake 1057.61 ml  Output 2521 ml  Net -1463.39 ml   Filed Weights   12/16/19 1357  Weight: 79.8 kg    Examination:   General: No acute distress Neurology: Alert and responsive, no acute focal deficit E ENT: mild pallor, no icterus, oral mucosa moist Cardiovascular: RRR, no gallops, rubs, or murmurs. No lower extremity edema. Pulmonary: Decreased breath sounds at right base otherwise clear to auscultation Gastrointestinal. Abdomen with no organomegaly, non tender, no rebound or guarding Skin. No rashes Musculoskeletal: no joint deformities     Data Reviewed: I have personally reviewed following labs and imaging studies  CBC: Recent Labs  Lab 12/16/19 1400 12/16/19 1400 12/16/19 1605 12/17/19 0307 12/18/19 0221 12/19/19 0548 12/23/19 0323  WBC 7.7  --   --  5.3 4.2 4.6  8.0  NEUTROABS 5.2  --   --   --   --   --  5.1  HGB 10.9*   < > 11.6* 9.3* 9.3* 9.9* 10.9*  HCT 34.5*   < > 34.0* 29.1* 30.0* 30.7* 34.7*  MCV 84.6  --   --  83.4 84.7 82.5 83.2  PLT 150  --   --  147* 155 190 301   < > = values in this interval not displayed.   Basic Metabolic Panel: Recent Labs  Lab 12/16/19 1400 12/16/19 1400 12/16/19 1605 12/17/19 0307 12/18/19 0221 12/19/19 0548  12/23/19 0323  NA 133*   < > 133* 137 140 141 137  K 3.8   < > 3.6 3.6 3.5 3.6 3.9  CL 99   < > 96* 103 107 105 104  CO2 26  --   --  26 26 30 25   GLUCOSE 108*   < > 101* 127* 110* 95 109*  BUN 11   < > 12 8 5* <5* 10  CREATININE 0.97   < > 1.00 0.88 0.75 0.90 1.11*  CALCIUM 8.6*  --   --  8.0* 8.3* 8.6* 8.3*   < > = values in this interval not displayed.   GFR: Estimated Creatinine Clearance: 48.1 mL/min (A) (by C-G formula based on SCr of 1.11 mg/dL (H)). Liver Function Tests: Recent Labs  Lab 12/16/19 1400 12/19/19 0548  AST 14* 12*  ALT 11 11  ALKPHOS 116 194*  BILITOT 1.1 0.7  PROT 6.6 5.6*  ALBUMIN 3.0* 2.3*   Recent Labs  Lab 12/16/19 1400  LIPASE 16   No results for input(s): AMMONIA in the last 168 hours. Coagulation Profile: No results for input(s): INR, PROTIME in the last 168 hours. Cardiac Enzymes: No results for input(s): CKTOTAL, CKMB, CKMBINDEX, TROPONINI in the last 168 hours. BNP (last 3 results) No results for input(s): PROBNP in the last 8760 hours. HbA1C: No results for input(s): HGBA1C in the last 72 hours. CBG: No results for input(s): GLUCAP in the last 168 hours. Lipid Profile: No results for input(s): CHOL, HDL, LDLCALC, TRIG, CHOLHDL, LDLDIRECT in the last 72 hours. Thyroid Function Tests: No results for input(s): TSH, T4TOTAL, FREET4, T3FREE, THYROIDAB in the last 72 hours. Anemia Panel: Recent Labs    12/20/19 1924  FERRITIN 84  TIBC 192*  IRON 35      Radiology Studies: I have reviewed all of the imaging during this hospital visit personally     Scheduled Meds: . alteplase (TPA) for intrapleural administration  10 mg Intrapleural Once   And  . pulmozyme (DORNASE) for intrapleural administration  5 mg Intrapleural Once  . aspirin EC  81 mg Oral QHS  . atenolol  50 mg Oral Daily  . citalopram  20 mg Oral Daily  . cycloSPORINE  1 drop Both Eyes BID  . heparin  5,000 Units Subcutaneous Q8H  . levothyroxine  50 mcg Oral  Daily  . loratadine  10 mg Oral Daily  . multivitamin with minerals  1 tablet Oral QHS  . oxybutynin  10 mg Oral Daily  . pantoprazole  40 mg Oral BID  . pneumococcal 23 valent vaccine  0.5 mL Intramuscular Tomorrow-1000  . pregabalin  150 mg Oral BID  . rosuvastatin  10 mg Oral QHS   Continuous Infusions: . piperacillin-tazobactam (ZOSYN)  IV 3.375 g (12/23/19 0545)     LOS: 7 days        Benito Mccreedy, MD

## 2019-12-24 ENCOUNTER — Inpatient Hospital Stay (HOSPITAL_COMMUNITY): Payer: 59

## 2019-12-24 LAB — BODY FLUID CULTURE: Culture: NO GROWTH

## 2019-12-24 MED ORDER — SODIUM CHLORIDE 0.9 % IV SOLN
INTRAVENOUS | Status: DC | PRN
  Administered 2019-12-24: 250 mL via INTRAVENOUS

## 2019-12-24 NOTE — Progress Notes (Signed)
NAME:  Suzanne Harrison, MRN:  [id number], DOB:  09-11-1951, LOS: 8 ADMISSION DATE:  12/16/2019, CONSULTATION DATE:  12/20/19 REFERRING MD:  Garner Gavel  CHIEF COMPLAINT:  Dyspnea, pleuritic pain  Brief History   Suzanne Harrison is a 69 y.o. female who was admitted 1/16 with right pleuritic pain and mild dyspnea.  Had acute cholecystitis in Nov 2020 where lap chole was attempted but not successful.  Ultimately had perc chole placed 11/4 and removed 11/13. Returned 1/16 with above symptoms.  Had thora by IR with removed.  Analysis suggestive of empyema. PCCM asked to see 1/20 for further recs.  History of present illness   Suzanne Harrison is a 69 y.o. female who has a PMH including but not limited to HTN, HLD (see "past medical history" for rest).  She had recent admission 10/01/19 through 10/05/20 for acute cholecystitis.  She had unsuccessful lap chole on 10/03/19 secondary to adherence of GB to bowel and subsequently had per chole tube placed by IR on 10/04/19.  She was discharged home on 10/06/19 and had tube removed on 11/13.    She then presented to [facility] ED 1/16 with right sided pleuritic chest pain and dyspnea.  CT A / P demonstrated loculated pleural effusion.  There was some concern for fluid communication subdiaphragmatically with possible biliary leak; however, after being evaluated by surgery, it was not felt to be the case.  She had right thora performed by IR on 1/19 and had of amber fluid removed.  Labs suggestive of infection / empyema (2880 WBCs with 60% neuts).  She was initially started on doxy and later changed to zosyn.  PCCM asked to see in consultation 1/20 for further recs.  Bedside POCUS performed which showed large fluid pocket on the right with some atelectatic lung flaps inferomedially.   We have requested dedicated CT chest to be performed for further evaluation of effusion characteristics.  Past Medical History  has Hip arthritis; History of total hip  arthroplasty; Referred ear pain; Acute cholecystitis; Loculated pleural effusion; Essential hypertension; Dyslipidemia; Hypothyroid; Anxiety; Chronic anemia; Parapneumonic effusion; and Lobar pneumonia (HCC) on their problem list.  Significant Hospital Events   1/16 > admit. 1/19 > R thora by IR with amber fluid removed (2880 WBCs with 60% neuts). 1/20 > PCCM consulted.  Consults:  IR, surgery, PCCM  Procedures:  1/19 > R thora  Significant Diagnostic Tests:  CT A / P 1/16 > Loculated right effusion with some questionable pleural thickening CTA chest 1/20 > multiple loculated effusions, not all contiguous throughout the entire posterior hemithorax on the right  Micro Data:  Blood 1/20 >  R pleural fluid 1/19 > WBCs, no organisms SARS CoV2 1/16 >  R pleural fluid 1/21>> WBCs, no organisms  Antimicrobials:  Doxy 1/16 > 1/20. Zosyn 1/20 >    Interim history/subjective:  No events, chest pain improved today. Minimal chest tube output.  Objective:  Blood pressure (!) 107/54, pulse (!) 59, temperature 98 F (36.7 C), temperature source Oral, resp. rate 18, height 5\' 2"  (1.575 m), weight 79.8 kg, SpO2 98 %.        Intake/Output Summary (Last 24 hours) at 12/24/2019 1258 Last data filed at 12/24/2019 1129 Gross per 24 hour  Intake 778 ml  Output 2250 ml  Net -1472 ml   Filed Weights   12/16/19 1357  Weight: 79.8 kg    Examination: GEN: chronically ill woman in NAD HEENT: MMM, trachea midline CV: RRR,  ext warm PULM: Slightly diminished on R, chest tube in place with straw colored output, no air leak GI: Soft, +BS EXT: No edema NEURO: Moves all 4 ext to command, globally weak PSYCH: AOx3, fair insight SKIN: No rashes   Assessment & Plan:   Right complex effusion, parapneumonic vs. Reactive from recent abdominal manipulation.  S/p 7 days zosyn which is reasonable. - CT chest without contrast, if minimal remaining fluid and/or air will DC chest tube - Will  touch base with patient after CT, fine to leave chest tube to water seal  Erskine Emery MD PCCM

## 2019-12-24 NOTE — Progress Notes (Signed)
Pigtail removed, occlusive dressing applied.  Keep dressing in place for 24h. Will arrange f/u in clinic with Dr. Chestine Spore in 1-2 weeks with CXR. Will sign off, call if we can be of further help.  Myrla Halsted MD PCCM

## 2019-12-24 NOTE — Plan of Care (Signed)
Chest tube pulled today by MD, gauze dressing in place  Problem: Education: Goal: Knowledge of General Education information will improve Description: Including pain rating scale, medication(s)/side effects and non-pharmacologic comfort measures Outcome: Progressing   Problem: Clinical Measurements: Goal: Respiratory complications will improve Outcome: Progressing Note: On room air   Problem: Activity: Goal: Risk for activity intolerance will decrease Outcome: Progressing Note: Up with standby assist to bathroom, tolerated well   Problem: Nutrition: Goal: Adequate nutrition will be maintained Outcome: Progressing   Problem: Coping: Goal: Level of anxiety will decrease Outcome: Progressing   Problem: Elimination: Goal: Will not experience complications related to bowel motility Outcome: Progressing Note: BM this shift   Problem: Pain Managment: Goal: General experience of comfort will improve Outcome: Progressing Note: Treated for rib cage pain on the right once with oxycodone   Problem: Safety: Goal: Ability to remain free from injury will improve Outcome: Progressing

## 2019-12-24 NOTE — Progress Notes (Signed)
PROGRESS NOTE    [place]  QQV:[id number] DOB: 30-Sep-1951 DOA: 12/16/2019 PCP: Maurice Small, MD    Brief Narrative:  69 year old female with past medical history significant for but not limited CKD, hypertension dyslipidemia.  She had cholecystostomy tube placement for cholecystitis in November 2020, with tube removal 10 days prior to presentation with right upper quadrant abdominal pain worse with deep breathing.CT scan showed loculated right pleural effusion for which reason she was admitted to the hospital.   Further evaluation with fluid analysis consistent with complicated para-pneumonic effusion.    Assessment & Plan:   Principal Problem:   Lobar pneumonia (HCC) Active Problems:   Loculated pleural effusion   Essential hypertension   Dyslipidemia   Hypothyroid   Anxiety   Chronic anemia   Parapneumonic effusion   1. Right sided loculated effusion/ community acquired pneumonia with complicated parapneumonic effusion. (present on admission).  Cultures with no growth.  Chest tube with no air leak, output over last 24 h 1,020 ml.  Tpa to the plural space per pulmonary recommendations.  WBC within normal limits Clarified to pharmacist-continue with Zosyn #8 12/23/2019 -continue with 2 more days to make a total of 10 days of IV antibiotic treatment  Continue with bronchodilators.  Continue pain control and physical therapy.  Out of bed as tolerated.   2, HTN/ hypotension. Blood pressure soft-atenolol and lisinopril on hold currently-as needed normal saline boluses as needed 3. Dyslipidemia.  Continue with rosuvastatin.   4. Hypothyroid.  On levothyroxine.  5. Anemia/ chronic.  Hgb has been stable at 9,9 and hct at 30,7. Follow cell count in am.   6. Anxiety.  Continue with citalopram.     DVT prophylaxis: enoxaparin   Code Status:  full Family Communication: no family at the bedside Disposition Plan/ discharge barriers: pending resolution  of right pleural effusion   Consultants:   Pulmonary   Procedures:   Thoracentesis right   Chest tune placement right   Antimicrobials:   Zosyn     Subjective: Mild right pleuritic chest pains with deep breathing, otherwise no f/c, tolerating right CT without any problems  Objective: Vitals:   12/23/19 2107 12/24/19 0421 12/24/19 1004 12/24/19 1440  BP: (!) 109/50 (!) 121/58 (!) 107/54 119/68  Pulse: 66 65 (!) 59 64  Resp: 16 16 18 18   Temp: 98.5 F (36.9 C) 98.5 F (36.9 C) 98 F (36.7 C) 98.1 F (36.7 C)  TempSrc: Oral Oral Oral Oral  SpO2: 97% 97% 98% 98%  Weight:      Height:        Intake/Output Summary (Last 24 hours) at 12/24/2019 1618 Last data filed at 12/24/2019 1440 Gross per 24 hour  Intake 880.22 ml  Output 2250 ml  Net -1369.78 ml   Filed Weights   12/16/19 1357  Weight: 79.8 kg    Examination:   General: No acute distress Neurology: Alert and responsive, no acute focal deficit E ENT: mild pallor, no icterus, oral mucosa moist Cardiovascular: RRR, no gallops, rubs, or murmurs. No lower extremity edema. Pulmonary: Decreased breath sounds at right base otherwise clear to auscultation Gastrointestinal. Abdomen with no organomegaly, non tender, no rebound or guarding Skin. No rashes Musculoskeletal: no joint deformities     Data Reviewed: I have personally reviewed following labs and imaging studies  CBC: Recent Labs  Lab 12/18/19 0221 12/19/19 0548 12/23/19 0323  WBC 4.2 4.6 8.0  NEUTROABS  --   --  5.1  HGB 9.3* 9.9* 10.9*  HCT 30.0* 30.7* 34.7*  MCV 84.7 82.5 83.2  PLT 155 190 619   Basic Metabolic Panel: Recent Labs  Lab 12/18/19 0221 12/19/19 0548 12/23/19 0323  NA 140 141 137  K 3.5 3.6 3.9  CL 107 105 104  CO2 26 30 25   GLUCOSE 110* 95 109*  BUN 5* <5* 10  CREATININE 0.75 0.90 1.11*  CALCIUM 8.3* 8.6* 8.3*   GFR: Estimated Creatinine Clearance: 48.1 mL/min (A) (by C-G formula based on SCr of 1.11 mg/dL  (H)). Liver Function Tests: Recent Labs  Lab 12/19/19 0548  AST 12*  ALT 11  ALKPHOS 194*  BILITOT 0.7  PROT 5.6*  ALBUMIN 2.3*   No results for input(s): LIPASE, AMYLASE in the last 168 hours. No results for input(s): AMMONIA in the last 168 hours. Coagulation Profile: No results for input(s): INR, PROTIME in the last 168 hours. Cardiac Enzymes: No results for input(s): CKTOTAL, CKMB, CKMBINDEX, TROPONINI in the last 168 hours. BNP (last 3 results) No results for input(s): PROBNP in the last 8760 hours. HbA1C: No results for input(s): HGBA1C in the last 72 hours. CBG: No results for input(s): GLUCAP in the last 168 hours. Lipid Profile: No results for input(s): CHOL, HDL, LDLCALC, TRIG, CHOLHDL, LDLDIRECT in the last 72 hours. Thyroid Function Tests: No results for input(s): TSH, T4TOTAL, FREET4, T3FREE, THYROIDAB in the last 72 hours. Anemia Panel: No results for input(s): VITAMINB12, FOLATE, FERRITIN, TIBC, IRON, RETICCTPCT in the last 72 hours.    Radiology Studies: I have reviewed all of the imaging during this hospital visit personally     Scheduled Meds: . aspirin EC  81 mg Oral QHS  . atenolol  50 mg Oral Daily  . citalopram  20 mg Oral Daily  . cycloSPORINE  1 drop Both Eyes BID  . heparin  5,000 Units Subcutaneous Q8H  . levothyroxine  50 mcg Oral Daily  . loratadine  10 mg Oral Daily  . multivitamin with minerals  1 tablet Oral QHS  . oxybutynin  10 mg Oral Daily  . pantoprazole  40 mg Oral BID  . pneumococcal 23 valent vaccine  0.5 mL Intramuscular Tomorrow-1000  . pregabalin  150 mg Oral BID  . rosuvastatin  10 mg Oral QHS   Continuous Infusions: . sodium chloride Stopped (12/24/19 1413)  . piperacillin-tazobactam (ZOSYN)  IV 3.375 g (12/24/19 1413)     LOS: 8 days        Benito Mccreedy, MD

## 2019-12-24 NOTE — Plan of Care (Signed)

## 2019-12-25 LAB — CULTURE, BLOOD (ROUTINE X 2)
Culture: NO GROWTH
Culture: NO GROWTH
Special Requests: ADEQUATE

## 2019-12-25 LAB — CBC
HCT: 33.8 % — ABNORMAL LOW (ref 36.0–46.0)
Hemoglobin: 10.6 g/dL — ABNORMAL LOW (ref 12.0–15.0)
MCH: 26.2 pg (ref 26.0–34.0)
MCHC: 31.4 g/dL (ref 30.0–36.0)
MCV: 83.7 fL (ref 80.0–100.0)
Platelets: 329 10*3/uL (ref 150–400)
RBC: 4.04 MIL/uL (ref 3.87–5.11)
RDW: 14.8 % (ref 11.5–15.5)
WBC: 6 10*3/uL (ref 4.0–10.5)
nRBC: 0 % (ref 0.0–0.2)

## 2019-12-25 LAB — COMPREHENSIVE METABOLIC PANEL
ALT: 8 U/L (ref 0–44)
AST: 13 U/L — ABNORMAL LOW (ref 15–41)
Albumin: 2.2 g/dL — ABNORMAL LOW (ref 3.5–5.0)
Alkaline Phosphatase: 103 U/L (ref 38–126)
Anion gap: 9 (ref 5–15)
BUN: 6 mg/dL — ABNORMAL LOW (ref 8–23)
CO2: 27 mmol/L (ref 22–32)
Calcium: 8.5 mg/dL — ABNORMAL LOW (ref 8.9–10.3)
Chloride: 104 mmol/L (ref 98–111)
Creatinine, Ser: 1.08 mg/dL — ABNORMAL HIGH (ref 0.44–1.00)
GFR calc Af Amer: >60 mL/min (ref >60)
GFR calc non Af Amer: 53 mL/min — ABNORMAL LOW (ref >60)
Glucose, Bld: 102 mg/dL — ABNORMAL HIGH (ref 70–99)
Potassium: 3.9 mmol/L (ref 3.5–5.1)
Sodium: 140 mmol/L (ref 135–145)
Total Bilirubin: 0.2 mg/dL — ABNORMAL LOW (ref 0.3–1.2)
Total Protein: 5.4 g/dL — ABNORMAL LOW (ref 6.5–8.1)

## 2019-12-25 LAB — PATHOLOGIST SMEAR REVIEW

## 2019-12-25 MED ORDER — GUAIFENESIN-DM 100-10 MG/5ML PO SYRP: 5.0000 mL | ORAL_SOLUTION | Freq: Four times a day (QID) | ORAL | 0 refills | Status: DC | PRN

## 2019-12-25 MED ORDER — IPRATROPIUM-ALBUTEROL 0.5-2.5 (3) MG/3ML IN SOLN: 3.0000 mL | Freq: Four times a day (QID) | RESPIRATORY_TRACT | 0 refills | Status: DC | PRN

## 2019-12-25 NOTE — Progress Notes (Signed)
Physical Therapy Treatment/ Discharge Patient Details Name: Suzanne Harrison MRN: [id number] DOB: 23-May-1951 Today's Date: 12/25/2019    History of Present Illness Patient is a 69 year old female with PMH significant for HTN, HL who was recently discharged 2 weeks ago after treatment for acute cholecystitis. Pt returns with R quadrant pain and loculated pleural effusion. Thoracentesis performed 1/18.    PT Comments    Pt pleasant and reports feeling well since chest tube removed. Pt able to don shoes EOB, perform all transfers without assist, walk long hall distance without assist and complete stairs. Pt reports she has returned to her baseline functional status and agreeable to no further therapy needs. PT encouraged to continue gait acutely and reports assist as needed of spouse at home. Will sign off.    Follow Up Recommendations  No PT follow up     Equipment Recommendations  None recommended by PT    Recommendations for Other Services       Precautions / Restrictions Precautions Precautions: None    Mobility  Bed Mobility Overal bed mobility: Modified Independent             General bed mobility comments: bed flat  Transfers Overall transfer level: Modified independent               General transfer comment: pt able to stand from bed and toilet without assist  Ambulation/Gait Ambulation/Gait assistance: Modified independent (Device/Increase time) Gait Distance (Feet): 500 Feet Assistive device: Straight cane Gait Pattern/deviations: Step-through pattern;Decreased stride length   Gait velocity interpretation: >4.37 ft/sec, indicative of normal walking speed General Gait Details: pt with shoes donned for shoe lift and use of cane throughout with steady gait and pt reporting baseline gait speed   Stairs Stairs: Yes Stairs assistance: Modified independent (Device/Increase time) Stair Management: With cane;Alternating pattern;Forwards Number of  Stairs: 4 General stair comments: pt able to perform stairs with only use of cane and no need for railing   Wheelchair Mobility    Modified Rankin (Stroke Patients Only)       Balance Overall balance assessment: Mild deficits observed, not formally tested                                          Cognition Arousal/Alertness: Awake/alert Behavior During Therapy: WFL for tasks assessed/performed Overall Cognitive Status: Within Functional Limits for tasks assessed                                        Exercises      General Comments        Pertinent Vitals/Pain Pain Score: 2  Pain Location: right chest Pain Descriptors / Indicators: Sore Pain Intervention(s): Monitored during session;Repositioned    Home Living                      Prior Function            PT Goals (current goals can now be found in the care plan section) Progress towards PT goals: Goals met/education completed, patient discharged from PT    Frequency           PT Plan Current plan remains appropriate    Co-evaluation              AM-PAC PT "  6 Clicks" Mobility   Outcome Measure  Help needed turning from your back to your side while in a flat bed without using bedrails?: None Help needed moving from lying on your back to sitting on the side of a flat bed without using bedrails?: None Help needed moving to and from a bed to a chair (including a wheelchair)?: None Help needed standing up from a chair using your arms (e.g., wheelchair or bedside chair)?: None Help needed to walk in hospital room?: A Little Help needed climbing 3-5 steps with a railing? : None 6 Click Score: 23    End of Session   Activity Tolerance: Patient tolerated treatment well Patient left: in chair;with call bell/phone within reach Nurse Communication: Mobility status       Time: 0865-7846 PT Time Calculation (min) (ACUTE ONLY): 24 min  Charges:  $Gait  Training: 8-22 mins $Therapeutic Activity: 8-22 mins                     Illyana Schorsch P, PT Acute Rehabilitation Services Pager: [phone] Office: [place] 12/25/2019, 1:34 PM

## 2019-12-25 NOTE — Discharge Summary (Signed)
Physician Discharge Summary  Suzanne Harrison ZOX:[id number] DOB: [birth year] DOA: 12/16/2019  PCP: Maurice SmallGriffin, Elaine, MD  Admit date: 12/16/2019 Discharge date: 12/25/2019  Admitted From: Home  Disposition:  Home   Recommendations for Outpatient Follow-up and new medication changes:  1. Follow up with Dr. Valentina LucksGriffin in 1 week. 2. Follow with Dr. Chestine Sporelark in 2 weeks with follow up chest radiograph.  3. Follow up with General Surgery as scheduled.   Home Health: no   Equipment/Devices: no    Discharge Condition: stable  CODE STATUS: full  Diet recommendation: heart healthy   Brief/Interim Summary: 69 year old female who presented with chest pain. She does have significant past medical history for chronic kidney disease, hypertension, dyslipidemia and recent cholecystitis, status post cholecystostomy tube. About 10 days prior to hospitalization cholecystostomy tube was removed, 8 days later she developed right upper quadrant abdominal pain,radiated to the flank region,worse with deep breathing. On her initial physical examination blood pressure was 138/69, heart rate 66, respiratory rate 16, oxygen saturation 95%, her lungs had decreased breath sounds in the right lower field, heart S1-S2 present rhythmic, abdomen nontender and soft, no lower extremities no edema.  Sodium 133, potassium 3.8, chloride 99, bicarb 26, glucose 108, BUN 11, creatinine 0.97, white count 7.7, hemoglobin 10.9, hematocrit 34.5, platelets 150.  SARS COVID-19 was negative.  Urine analysis negative for infection. CT scan showed loculated right pleural effusion. Ultrasonography with residual gallbladder wall thickening along the interface with the liver, 7 mm non-obstructing gallstone.  Patient was admitted to the hospital working diagnosis of new right pleural effusion to rule out empyema.  Patient underwent pleural drainage, 800 mL of amber/ sanguinous fluid, with 2,800 to 963 PMN . Follow-up chest radiograph showed  persistent effusion and patient underwent 12/21/19 chest tube placement with no major complications.   Patient responded well to chest tube drainage plus tpa to the pleural space, fluid analysis consistent with complicated para-pneumonic effusion  1. Right sided loculated effusion/ community acquired pneumonia with complicated parapneumonic effusion. (present on admission)/possible reactive effusion due to abdominal manipulation.  Patient responded well to chest tube drainage, he received complete 8 days of antibiotic therapy with Zosyn.  Cultures remain no growth.  No organisms seen.  Patient received TPA to the pleural space with no major complications.  Follow-up CT chest showed resolution of the effusion and chest tube was removed on January 24.  Patient will follow-up as an outpatient with the pulmonary clinic with a follow-up chest radiograph.  2.  Hypertension.  Continue blood pressure control with atenolol and lisinopril.  3.  Dyslipidemia.  Continue statin.  4.  Hypothyroidism.  Continue levothyroxine.  5.  Chronic anemia.  Multifactorial, cell count remained stable.  6.  Anxiety.  Continue citalopram.  Discharge Diagnoses:  Principal Problem:   Lobar pneumonia (HCC) Active Problems:   Loculated pleural effusion   Essential hypertension   Dyslipidemia   Hypothyroid   Anxiety   Chronic anemia   Parapneumonic effusion    Discharge Instructions   Allergies as of 12/25/2019      Reactions   Gatifloxacin Rash, Other (See Comments)   Other reaction(s): Myalgias (Muscle Pain) Rash in mouth   Sulfa Antibiotics Nausea Only      Medication List    STOP taking these medications   amoxicillin 500 MG tablet Commonly known as: AMOXIL   methocarbamol 750 MG tablet Commonly known as: ROBAXIN   ondansetron 4 MG disintegrating tablet Commonly known as: ZOFRAN-ODT   oxyCODONE-acetaminophen 10-325 MG tablet  Commonly known as: PERCOCET     TAKE these medications    Artificial Tears 1.4 % ophthalmic solution Generic drug: polyvinyl alcohol Place 1 drop into both eyes 5 (five) times daily.   aspirin EC 81 MG tablet Take 81 mg by mouth at bedtime.   atenolol 50 MG tablet Commonly known as: TENORMIN Take 50 mg by mouth daily.   CALCIUM-D PO Take 1-2 tablets by mouth See admin instructions. Take 2 tablets by mouth every morning and 1 tablet at night   cetirizine 10 MG tablet Commonly known as: ZYRTEC Take 10 mg by mouth daily.   citalopram 20 MG tablet Commonly known as: CELEXA Take 20 mg by mouth daily.   Denta 5000 Plus 1.1 % Crea dental cream Generic drug: sodium fluoride Place 1 application onto teeth 2 (two) times daily. Do not eat, drink or rinse for 30 minutes after use.   guaiFENesin-dextromethorphan 100-10 MG/5ML syrup Commonly known as: ROBITUSSIN DM Take 5 mLs by mouth every 6 (six) hours as needed for cough.   Hair/Skin/Nails/Biotin Tabs Take 1 tablet by mouth daily.   ipratropium-albuterol 0.5-2.5 (3) MG/3ML Soln Commonly known as: DUONEB Take 3 mLs by nebulization every 6 (six) hours as needed for up to 15 days.   levothyroxine 50 MCG tablet Commonly known as: SYNTHROID Take 50 mcg by mouth daily.   lisinopril 5 MG tablet Commonly known as: ZESTRIL Take 5 mg by mouth daily.   Lyrica 150 MG capsule Generic drug: pregabalin Take 150 mg by mouth 2 (two) times daily.   multivitamin with minerals Tabs tablet Take 1 tablet by mouth daily.   oxybutynin 10 MG 24 hr tablet Commonly known as: DITROPAN-XL Take 10 mg by mouth daily.   pantoprazole 40 MG tablet Commonly known as: PROTONIX Take 40 mg by mouth 2 (two) times daily.   rosuvastatin 10 MG tablet Commonly known as: CRESTOR Take 10 mg by mouth at bedtime.   Xiidra 5 % Soln Generic drug: Lifitegrast Place 1 drop into both eyes 2 (two) times daily.            Durable Medical Equipment  (From admission, onward)         Start     Ordered    12/25/19 0000  For home use only DME Nebulizer machine    Question Answer Comment  Patient needs a nebulizer to treat with the following condition Dyspnea   Length of Need 6 Months      12/25/19 1226         Follow-up Information    Emelia Loron, MD. Go on 01/18/2020.   Specialty: General Surgery Why: We have changed your appointment to 01/18/2020 at 230pm. Please arrive 30 minutes prior to your appointment for paperwork. Please bring a copy of your photo ID and insurance card to the appointment.  Contact information: [address] [place] [address] [phone]        Maurice Small, MD Follow up in 1 week(s).   Specialty: Family Medicine Contact information: [address] [place] [address] [phone]        Karie Fetch P, DO Follow up in 2 week(s).   Specialty: Pulmonary Disease Contact information: [address] [place] [address] [phone]          Allergies  Allergen Reactions  . Gatifloxacin Rash and Other (See Comments)    Other reaction(s): Myalgias (Muscle Pain) Rash in mouth   . Sulfa  Antibiotics Nausea Only    Consultations:  Pulmonary      Procedures/Studies: DG Chest 1 View  Result Date: 12/18/2019 CLINICAL DATA:  Post RIGHT thoracentesis EXAM: CHEST  1 VIEW COMPARISON:  10/01/2019 FINDINGS: Enlargement of cardiac silhouette. Mediastinal contours and pulmonary vascularity normal. Spinal fixation rods from prior surgery. Small RIGHT pleural effusion and basilar atelectasis. No acute infiltrate or pneumothorax. No acute osseous findings. IMPRESSION: No pneumothorax following RIGHT thoracentesis. Residual RIGHT pleural effusion and basilar atelectasis. Electronically Signed   By: Ulyses Southward M.D.   On: 12/18/2019 14:21   CT CHEST WO CONTRAST  Result Date: 12/24/2019 CLINICAL DATA:  Empyema, status post chest tube placement. EXAM: CT CHEST WITHOUT CONTRAST TECHNIQUE: Multidetector CT  imaging of the chest was performed following the standard protocol without IV contrast. COMPARISON:  Chest CT dated 12/20/2019. FINDINGS: Cardiovascular: Heart size is within normal limits. No pericardial effusion. Mediastinum/Nodes: No mass or enlarged lymph nodes are seen within the mediastinum or perihilar regions. Esophagus appears grossly normal, partially obscured by artifact emanating from metallic spinal hardware. Trachea and central bronchi are unremarkable. Lungs/Pleura: New pigtail catheter at the RIGHT lung base, coursing along the posterior and lateral aspects of the RIGHT lower lung, loosely coiled laterally. The multiloculated fluid collection/empyema at the RIGHT lung base is nearly completely resolved status post chest tube placement. Small amount of residual atelectasis. There is now a small associated pneumothorax at the anterior aspects of the RIGHT lung base. Upper Abdomen: No acute findings. Musculoskeletal: No acute or suspicious osseous finding. Fixation hardware throughout the thoracic spine. IMPRESSION: 1. New chest tube at the RIGHT lung base, coursing along the posterior and lateral aspects of the RIGHT lower lung, loosely coiled laterally. The multiloculated fluid collection/empyema at the RIGHT lung base is nearly completely resolved status post chest tube placement. 2. New small associated pneumothorax at the anterior aspects of the RIGHT lung base. Electronically Signed   By: Bary Richard M.D.   On: 12/24/2019 14:11   CT ANGIO CHEST PE W OR WO CONTRAST  Result Date: 12/20/2019 CLINICAL DATA:  Empyema EXAM: CT ANGIOGRAPHY CHEST WITH CONTRAST TECHNIQUE: Multidetector CT imaging of the chest was performed using the standard protocol during bolus administration of intravenous contrast. Multiplanar CT image reconstructions and MIPs were obtained to evaluate the vascular anatomy. CONTRAST:  75mL OMNIPAQUE IOHEXOL 350 MG/ML SOLN IV COMPARISON:  None. FINDINGS: Cardiovascular: Scattered  atherosclerotic calcifications aorta and minimally in coronary arteries. Ascending thoracic aorta upper normal caliber 3.7 cm diameter. Minimal enlargement of cardiac chambers. No pericardial effusion. Pulmonary arteries adequately opacified. Portions of the lower lobe pulmonary arteries are suboptimally assessed due to respiratory motion artifacts and streak artifacts from metallic hardware in the spine. Single questionable filling defect is identified in a LEFT lower lobe pulmonary artery, felt to represent streak artifact rather than a pulmonary embolus. No definite pulmonary emboli identified. Mediastinum/Nodes: Single minimally enlarged RIGHT paratracheal node 12 mm short axis image 46. No additional thoracic adenopathy. Base of cervical region normal appearance. Esophagus unremarkable. Lungs/Pleura: Moderate LEFT pleural effusion, partially loculated at the lung bases and major fissure. Compressive atelectasis in the RIGHT lower lobe and minimally in RIGHT middle lobe. No definite pleural enhancement or nodularity. Minimal subsegmental atelectasis LEFT lower lobe. LEFT lung otherwise clear. No infiltrate or pneumothorax. Upper Abdomen: Visualized upper abdomen unremarkable. Musculoskeletal: Prior spinal fixation throughout thoracic spine. Bones demineralized. Review of the MIP images confirms the above findings. IMPRESSION: No definite evidence of pulmonary embolism. Moderate RIGHT pleural  effusion, partially loculated, without definite nodularity or pleural thickening/enhancement; this could represent a complicated loculated effusion, unable to exclude empyema by CT. Minimal bibasilar atelectasis greater on RIGHT. Single nonspecific minimally enlarged RIGHT paratracheal lymph node. Aortic Atherosclerosis (ICD10-I70.0). Electronically Signed   By: Ulyses Southward M.D.   On: 12/20/2019 19:07   CT ABDOMEN PELVIS W CONTRAST  Result Date: 12/16/2019 CLINICAL DATA:  Right upper quadrant abdominal pain, complicated  postsurgical course with reported gallbladder surgery EXAM: CT ABDOMEN AND PELVIS WITH CONTRAST TECHNIQUE: Multidetector CT imaging of the abdomen and pelvis was performed using the standard protocol following bolus administration of intravenous contrast. CONTRAST:  OMNIPAQUE IOHEXOL 300 MG/ML  SOLN COMPARISON:  Percutaneous CT-guided imaging 10/04/2019, MR abdomen 10/02/2019, CT abdomen 10/01/2019 FINDINGS: Lower chest: Loculated right pleural effusion with some adjacent atelectatic changes in the lungs as well as more peripheral ground-glass and reticulonodular opacity in the right lower lung. Question some mild peripheral enhancement and pleural thickening. More bandlike areas of opacity compatible with atelectasis and/or scarring. Hepatobiliary: There are surgical clips in the gallbladder fossa likely reflecting at least partial cholecystectomy as well as a rib hypoattenuating tract through the liver parenchyma extending laterally compatible with prior placement of a cholecystostomy tube. Trace amount of fluid is seen along the lateral aspect of the liver surface extending towards the left lateral abdominal wall with some faint residual intercostal thickening at the level of the ninth interspace on the right (3/23) no other focal liver abnormality is seen. No acute pericholecystic inflammation is seen. No gallbladder wall thickening. No visible calcified intraluminal or intraductal gallstones. No biliary ductal dilatation. Pancreas: Fatty replacement of the pancreas. No pancreatic ductal dilatation or surrounding inflammatory changes. Spleen: Normal in size without focal abnormality. Small accessory splenules. Adrenals/Urinary Tract: Normal adrenal glands. Symmetric enhancement and excretion of the kidneys. Multiple fluid attenuation cysts are again seen in both kidneys, largest in the lower pole left kidney with some thin peripheral mural calcification (Bosniak II). Additional fat attenuation lesion in  the upper pole left kidney compatible with angiomyolipoma. Bilateral cortical cysts are noted. Left extrarenal pelvis is noted. No worrisome renal lesions, obstructive urolithiasis or hydronephrosis. Bladder is unremarkable. Stomach/Bowel: Distal esophagus, stomach and duodenal sweep are unremarkable. No small bowel wall thickening or dilatation. No evidence of obstruction. Appendix not well visualized. No focal pericecal inflammation is suggest occult appendicitis. Question some mild edematous mural thickening of the distal colon from the splenic flexure to the level of the rectum. Scattered colonic diverticula without focal pericolonic inflammation to suggest diverticulitis. Vascular/Lymphatic: Minimal atherosclerotic plaque within the normal caliber aorta. Reactive lymph nodes seen in the lower mediastinum near the GE junction. Few reactive nodes in the abdomen as well. No pathologically enlarged nodes. Reproductive: Normal appearance of the uterus and adnexal structures. Other: No free fluid or free air in the abdomen or pelvis. Perihepatic fluid and cholecystostomy tract, as above. Postsurgical changes of the anterior abdominal wall. Musculoskeletal: Extensive thoracolumbar fusion and bilateral total hip arthroplasties with significant metallic streak artifact. No acute hardware complication is seen. Prior L3 compression deformity is similar comparison. Underlying multilevel degenerative changes are present. IMPRESSION: 1. Surgical clips in the gallbladder fossa likely reflecting at least partial cholecystectomy as well as a rib hypoattenuating tract extending laterally through the liver parenchyma compatible with prior placement of a cholecystostomy tube. Trace amount of fluid along the lateral aspect of the liver surface extending towards the left lateral abdominal wall with some faint residual intercostal thickening at the level  of the ninth interspace on the right. No CT evidence of acute pericholecystic  inflammation. 2. Loculated right pleural effusion with some questionable pleural thickening and enhancement worrisome for empyema. While this could reflect a primary lung process such as a developing pneumonia with parapneumonic effusion, given some residual thickening adjacent the diaphragm could reflect traversal of the lateral-most diaphragmatic recess with placement and subsequent removal of the cholecystostomy tube. Direct sampling could better ascertain the origin of this fluid. 3. Question some mild edematous mural thickening of the distal colon from the splenic flexure to the level of the rectum. Findings could represent a mild colitis. 4. Colonic diverticula without evidence of acute diverticulitis. 5. 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 6. Extensive thoracolumbar fusion and bilateral total hip arthroplasties with significant metallic streak artifact. No acute hardware complication. 7.  Aortic Atherosclerosis (ICD10-I70.0). These results were called by telephone at the time of interpretation on 12/16/2019 at 5:22 pm to provider [facility] , who verbally acknowledged these results. Electronically Signed   By: Kreg Shropshire M.D.   On: 12/16/2019 17:23   DG Chest Port 1 View  Result Date: 12/24/2019 CLINICAL DATA:  Evaluate pleural effusion and chest tube. EXAM: PORTABLE CHEST 1 VIEW COMPARISON:  12/21/2019 FINDINGS: Again noted is a pigtail catheter coiled at the right lung base. Slightly increased densities at the right lung base likely represent atelectasis. There is now a small right apical pneumothorax. Heart and mediastinum are stable. Left lung is clear. Extensive surgical hardware in the thoracic spine. IMPRESSION: 1. New small right apical pneumothorax. 2. Chest tube is coiled at the right chest base. Right basilar densities are suggestive for atelectasis. Electronically Signed   By: Richarda Overlie M.D.   On: 12/24/2019 08:19   DG CHEST PORT 1 VIEW  Result  Date: 12/21/2019 CLINICAL DATA:  Pleural effusion, chest tube placement EXAM: PORTABLE CHEST 1 VIEW COMPARISON:  12/18/2019 FINDINGS: Coiled chest tube is present at the right lung base. Decreased right pleural effusion. No pneumothorax. Mild bibasilar atelectasis. Stable cardiomediastinal contours. Large thoracolumbar fusion construct. Degenerative changes at the shoulders. IMPRESSION: Coiled chest tube at the right lung base. Decreased right pleural effusion. No pneumothorax. Mild bibasilar atelectasis. Electronically Signed   By: Guadlupe Spanish M.D.   On: 12/21/2019 10:00   IR Radiologist Eval & Mgmt  Result Date: 12/13/2019 Please refer to notes tab for details about interventional procedure. (Op Note)  US Abdomen Limited RUQ  Result Date: 12/16/2019 CLINICAL DATA:  Post removal of gallbladder drain. EXAM: ULTRASOUND ABDOMEN LIMITED RIGHT UPPER QUADRANT COMPARISON:  October 02, 2019 FINDINGS: Gallbladder: The gallbladder is normally distended. There is wall thickening of 7.8 mm. The sonographic Murphy's sign was reported as negative. Gallbladder calculus measures 7 mm. Common bile duct: Diameter: 3.6 mm Liver: No focal lesion identified. Within normal limits in parenchymal echogenicity. Portal vein is patent on color Doppler imaging with normal direction of blood flow towards the liver. Other: Small right pleural effusion. 3.9 cm right upper pole renal cyst. IMPRESSION: Residual gallbladder wall thickening along the interface with the liver of 7.8 mm. 7 mm nonobstructive gallstone. Small right pleural effusion. Electronically Signed   By: Ted Mcalpine M.D.   On: 12/16/2019 15:38   IR THORACENTESIS ASP PLEURAL SPACE W/IMG GUIDE  Result Date: 12/18/2019 INDICATION: Right upper quadrant and right chest pain. Status post recent cholecystostomy placement. Right pleural effusion. Request for diagnostic and therapeutic thoracentesis. EXAM: ULTRASOUND GUIDED RIGHT THORACENTESIS MEDICATIONS: 1%  lidocaine 15 mL COMPLICATIONS: None immediate. PROCEDURE: An ultrasound guided thoracentesis was thoroughly discussed with the patient and questions answered. The benefits, risks, alternatives and complications were also discussed. The patient understands and wishes to proceed with the procedure. Written consent was obtained. Ultrasound was performed to localize and mark an adequate pocket of fluid in the right chest. The area was then prepped and draped in the normal sterile fashion. 1% Lidocaine was used for local anesthesia. Under ultrasound guidance a 6 Fr Safe-T-Centesis catheter was introduced. Thoracentesis was performed. The catheter was removed and a dressing applied. FINDINGS: A total of approximately 800 mL of amber fluid was removed. Samples were sent to the laboratory as requested by the clinical team. IMPRESSION: Successful ultrasound guided right thoracentesis yielding 800 mL of pleural fluid. No pneumothorax on post-procedure chest x-ray. Read by: Corrin ParkerWendy Blair, PA-C Electronically Signed   By: Richarda OverlieAdam  Henn M.D.   On: 12/18/2019 16:12      Procedures: Right thoracentesis and right sided chest tube.   Subjective: Patient is feeling well, dyspnea and pain continue to improve, occasional cough, no nausea or vomiting, patient has been out of bed with physical therapy.   Discharge Exam: Vitals:   12/24/19 1440 12/24/19 2119  BP: 119/68 133/63  Pulse: 64 60  Resp: 18 18  Temp: 98.1 F (36.7 C) 97.8 F (36.6 C)  SpO2: 98% 97%   Vitals:   12/24/19 0421 12/24/19 1004 12/24/19 1440 12/24/19 2119  BP: (!) 121/58 (!) 107/54 119/68 133/63  Pulse: 65 (!) 59 64 60  Resp: 16 18 18 18   Temp: 98.5 F (36.9 C) 98 F (36.7 C) 98.1 F (36.7 C) 97.8 F (36.6 C)  TempSrc: Oral Oral Oral Oral  SpO2: 97% 98% 98% 97%  Weight:      Height:        General: Not in pain or dyspnea.  Neurology: Awake and alert, non focal  E ENT: no pallor, no icterus, oral mucosa moist Cardiovascular: No JVD.  S1-S2 present, rhythmic, no gallops, rubs, or murmurs. No lower extremity edema. Pulmonary: poisitive breath sounds bilaterally, adequate air movement, no wheezing, rhonchi or rales. Gastrointestinal. Abdomen with, no organomegaly, non tender, no rebound or guarding Skin. No rashes Musculoskeletal: no joint deformities   The results of significant diagnostics from this hospitalization (including imaging, microbiology, ancillary and laboratory) are listed below for reference.     Microbiology: Recent Results (from the past 240 hour(s))  SARS CORONAVIRUS 2 (TAT 6-24 HRS) Nasopharyngeal Nasopharyngeal Swab     Status: None   Collection Time: 12/16/19  7:45 PM   Specimen: Nasopharyngeal Swab  Result Value Ref Range Status   SARS Coronavirus 2 NEGATIVE NEGATIVE Final    Comment: (NOTE) SARS-CoV-2 target nucleic acids are NOT DETECTED. The SARS-CoV-2 RNA is generally detectable in upper and lower respiratory specimens during the acute phase of infection. Negative results do not preclude SARS-CoV-2 infection, do not rule out co-infections with other pathogens, and should not be used as the sole basis for treatment or other patient management decisions. Negative results must be combined with clinical observations, patient history, and epidemiological information. The expected result is Negative. Fact Sheet for Patients: HairSlick.nohttps://www.fda.gov/media/138098/download Fact Sheet for Healthcare Providers: quierodirigir.comhttps://www.fda.gov/media/138095/download This test is not yet approved or cleared by the [place] FDA and  has been authorized for detection and/or diagnosis of SARS-CoV-2 by FDA under an Emergency Use Authorization (EUA). This EUA will remain  in effect (meaning this test can be used) for the  duration of the COVID-19 declaration under Section 56 4(b)(1) of the Act, 21 U.S.C. section 360bbb-3(b)(1), unless the authorization is terminated or revoked sooner. Performed at [facility], [place] [address], [place], [place] [address]   Gram stain     Status: None   Collection Time: 12/18/19  2:14 PM   Specimen: PATH Cytology Peritoneal fluid  Result Value Ref Range Status   Specimen Description PERITONEAL  Final   Special Requests NONE  Final   Gram Stain   Final    MODERATE WBC PRESENT, PREDOMINANTLY PMN NO ORGANISMS SEEN Performed at [facility], [place] [address] [place], [place] [address]    Report Status 12/18/2019 FINAL  Final  Culture, body fluid-bottle     Status: None   Collection Time: 12/18/19  2:14 PM   Specimen: Peritoneal Washings  Result Value Ref Range Status   Specimen Description PERITONEAL  Final   Special Requests NONE  Final   Culture   Final    NO GROWTH 5 DAYS Performed at [facility], [address] [place], [place] [address]    Report Status 12/23/2019 FINAL  Final  Culture, blood (routine x 2)     Status: None   Collection Time: 12/20/19  3:11 PM   Specimen: BLOOD  Result Value Ref Range Status   Specimen Description BLOOD RIGHT ANTECUBITAL  Final   Special Requests   Final    BOTTLES DRAWN AEROBIC AND ANAEROBIC Blood Culture adequate volume   Culture   Final    NO GROWTH 5 DAYS Performed at [facility], [place] [address], [place], [place] [address]    Report Status 12/25/2019 FINAL  Final  Culture, blood (routine x 2)     Status: None   Collection Time: 12/20/19  3:24 PM   Specimen: BLOOD LEFT ARM  Result Value Ref Range Status   Specimen Description BLOOD LEFT ARM  Final   Special Requests   Final    BOTTLES DRAWN AEROBIC ONLY Blood Culture results may not be optimal due to an inadequate volume of blood received in culture bottles   Culture   Final    NO GROWTH 5 DAYS Performed at [facility], [place] [address], [place], [place] [address]    Report Status 12/25/2019 FINAL  Final  Body fluid culture     Status: None   Collection Time: 12/21/19  9:39 AM   Specimen: Pleura; Body Fluid  Result  Value Ref Range Status   Specimen Description PLEURAL RIGHT  Final   Special Requests NONE  Final   Gram Stain   Final    FEW WBC PRESENT,BOTH PMN AND MONONUCLEAR NO ORGANISMS SEEN    Culture   Final    NO GROWTH Performed at [facility], [address], [place], [place] [address]    Report Status 12/24/2019 FINAL  Final     Labs: BNP (last 3 results) No results for input(s): BNP in the last 8760 hours. Basic Metabolic Panel: Recent Labs  Lab 12/19/19 0548 12/23/19 0323 12/25/19 0112  NA 141 137 140  K 3.6 3.9 3.9  CL 105 104 104  CO2 30 25 27   GLUCOSE 95 109* 102*  BUN <5* 10 6*  CREATININE 0.90 1.11* 1.08*  CALCIUM 8.6* 8.3* 8.5*   Liver Function Tests: Recent Labs  Lab 12/19/19 0548 12/25/19 0112  AST 12* 13*  ALT 11 8  ALKPHOS 194* 103  BILITOT  0.7 0.2*  PROT 5.6* 5.4*  ALBUMIN 2.3* 2.2*   No results for input(s): LIPASE, AMYLASE in the last 168 hours. No results for input(s): AMMONIA in the last 168 hours. CBC: Recent Labs  Lab 12/19/19 0548 12/23/19 0323 12/25/19 0112  WBC 4.6 8.0 6.0  NEUTROABS  --  5.1  --   HGB 9.9* 10.9* 10.6*  HCT 30.7* 34.7* 33.8*  MCV 82.5 83.2 83.7  PLT 190 301 329   Cardiac Enzymes: No results for input(s): CKTOTAL, CKMB, CKMBINDEX, TROPONINI in the last 168 hours. BNP: Invalid input(s): POCBNP CBG: No results for input(s): GLUCAP in the last 168 hours. D-Dimer No results for input(s): DDIMER in the last 72 hours. Hgb A1c No results for input(s): HGBA1C in the last 72 hours. Lipid Profile No results for input(s): CHOL, HDL, LDLCALC, TRIG, CHOLHDL, LDLDIRECT in the last 72 hours. Thyroid function studies No results for input(s): TSH, T4TOTAL, T3FREE, THYROIDAB in the last 72 hours.  Invalid input(s): FREET3 Anemia work up No results for input(s): VITAMINB12, FOLATE, FERRITIN, TIBC, IRON, RETICCTPCT in the last 72 hours. Urinalysis    Component Value Date/Time   COLORURINE YELLOW 12/16/2019 1625    APPEARANCEUR CLEAR 12/16/2019 1625   LABSPEC 1.012 12/16/2019 1625   PHURINE 5.0 12/16/2019 1625   GLUCOSEU NEGATIVE 12/16/2019 1625   HGBUR NEGATIVE 12/16/2019 1625   BILIRUBINUR NEGATIVE 12/16/2019 1625   KETONESUR NEGATIVE 12/16/2019 1625   PROTEINUR NEGATIVE 12/16/2019 1625   NITRITE NEGATIVE 12/16/2019 1625   LEUKOCYTESUR NEGATIVE 12/16/2019 1625   Sepsis Labs Invalid input(s): PROCALCITONIN,  WBC,  LACTICIDVEN Microbiology Recent Results (from the past 240 hour(s))  SARS CORONAVIRUS 2 (TAT 6-24 HRS) Nasopharyngeal Nasopharyngeal Swab     Status: None   Collection Time: 12/16/19  7:45 PM   Specimen: Nasopharyngeal Swab  Result Value Ref Range Status   SARS Coronavirus 2 NEGATIVE NEGATIVE Final    Comment: (NOTE) SARS-CoV-2 target nucleic acids are NOT DETECTED. The SARS-CoV-2 RNA is generally detectable in upper and lower respiratory specimens during the acute phase of infection. Negative results do not preclude SARS-CoV-2 infection, do not rule out co-infections with other pathogens, and should not be used as the sole basis for treatment or other patient management decisions. Negative results must be combined with clinical observations, patient history, and epidemiological information. The expected result is Negative. Fact Sheet for Patients: HairSlick.no Fact Sheet for Healthcare Providers: quierodirigir.com This test is not yet approved or cleared by the [place] FDA and  has been authorized for detection and/or diagnosis of SARS-CoV-2 by FDA under an Emergency Use Authorization (EUA). This EUA will remain  in effect (meaning this test can be used) for the duration of the COVID-19 declaration under Section 56 4(b)(1) of the Act, 21 U.S.C. section 360bbb-3(b)(1), unless the authorization is terminated or revoked sooner. Performed at [facility], [address], [place], [place] [address]   Gram stain      Status: None   Collection Time: 12/18/19  2:14 PM   Specimen: PATH Cytology Peritoneal fluid  Result Value Ref Range Status   Specimen Description PERITONEAL  Final   Special Requests NONE  Final   Gram Stain   Final    MODERATE WBC PRESENT, PREDOMINANTLY PMN NO ORGANISMS SEEN Performed at [facility], [address] [place], [place] [address]    Report Status 12/18/2019 FINAL  Final  Culture, body fluid-bottle     Status: None   Collection Time: 12/18/19  2:14 PM   Specimen: Peritoneal Washings  Result Value Ref Range Status   Specimen Description PERITONEAL  Final   Special Requests NONE  Final   Culture   Final    NO GROWTH 5 DAYS Performed at [facility], [address] [place], [place] [address]    Report Status 12/23/2019 FINAL  Final  Culture, blood (routine x 2)     Status: None   Collection Time: 12/20/19  3:11 PM   Specimen: BLOOD  Result Value Ref Range Status   Specimen Description BLOOD RIGHT ANTECUBITAL  Final   Special Requests   Final    BOTTLES DRAWN AEROBIC AND ANAEROBIC Blood Culture adequate volume   Culture   Final    NO GROWTH 5 DAYS Performed at [facility], [address], [place], [place] [address]    Report Status 12/25/2019 FINAL  Final  Culture, blood (routine x 2)     Status: None   Collection Time: 12/20/19  3:24 PM   Specimen: BLOOD LEFT ARM  Result Value Ref Range Status   Specimen Description BLOOD LEFT ARM  Final   Special Requests   Final    BOTTLES DRAWN AEROBIC ONLY Blood Culture results may not be optimal due to an inadequate volume of blood received in culture bottles   Culture   Final    NO GROWTH 5 DAYS Performed at [facility], [address] [place], [place] [address]    Report Status 12/25/2019 FINAL  Final  Body fluid culture     Status: None   Collection Time: 12/21/19  9:39 AM   Specimen: Pleura; Body Fluid  Result Value Ref Range Status   Specimen Description PLEURAL RIGHT  Final    Special Requests NONE  Final   Gram Stain   Final    FEW WBC PRESENT,BOTH PMN AND MONONUCLEAR NO ORGANISMS SEEN    Culture   Final    NO GROWTH Performed at [facility], [address], [place], [place] [address]    Report Status 12/24/2019 FINAL  Final     Time coordinating discharge: 45 minutes  SIGNED:   Coralie Keens, MD  [facility] 12/25/2019, 12:09 PM

## 2019-12-25 NOTE — Progress Notes (Signed)
Patient discharged to home with instrutions given to her husband.

## 2019-12-25 NOTE — Progress Notes (Signed)
Patient's husband came back to get the nebulizer.

## 2019-12-26 ENCOUNTER — Telehealth: Payer: Self-pay | Admitting: *Deleted

## 2019-12-26 DIAGNOSIS — J90 Pleural effusion, not elsewhere classified: Secondary | ICD-10-CM

## 2019-12-26 NOTE — Telephone Encounter (Signed)
-----   Message from Lorin Glass, MD sent at 12/24/2019  2:44 PM EST ----- Regarding: Follow up with Dr. Chestine Spore 1-2 weeks with CXR, f/u from effusion.  Thanks! Jesusita Oka

## 2019-12-26 NOTE — Telephone Encounter (Signed)
Spoke with the pt and scheduled ov with Dr Chestine Spore for 01/03/20  I went ahead and ordered the cxr

## 2020-01-03 ENCOUNTER — Other Ambulatory Visit: Payer: Self-pay

## 2020-01-03 ENCOUNTER — Encounter: Payer: Self-pay | Admitting: Critical Care Medicine

## 2020-01-03 ENCOUNTER — Ambulatory Visit (INDEPENDENT_AMBULATORY_CARE_PROVIDER_SITE_OTHER): Payer: 59 | Admitting: Critical Care Medicine

## 2020-01-03 ENCOUNTER — Ambulatory Visit (INDEPENDENT_AMBULATORY_CARE_PROVIDER_SITE_OTHER): Payer: 59

## 2020-01-03 VITALS — BP 130/76 | HR 62 | Temp 97.2°F | Ht 62.0 in | Wt 169.0 lb

## 2020-01-03 DIAGNOSIS — J90 Pleural effusion, not elsewhere classified: Secondary | ICD-10-CM

## 2020-01-03 NOTE — Patient Instructions (Addendum)
Thank you for visiting Dr. Chestine Spore at [facility]. We recommend the following:  Take your xray report to your spine surgeon about the hardware.  Talk to your surgeon about your abdominal pain.  You can use your nebulizers as needed.    Return if symptoms worsen or fail to improve.    Please do your part to reduce the spread of COVID-19.

## 2020-01-03 NOTE — Progress Notes (Signed)
Synopsis: Referred in January 2021 for loculated pleural effusion by Maurice Small, MD.   Subjective:   PATIENT ID: Suzanne Harrison GENDER: female DOB: June 15, 1951, MRN: [id number]  Chief Complaint  Patient presents with  . Hospitalization Follow-up    Patient is here for a follow up for a plueral effusion and shortness of breath. Patient states when she inhales she get a pain in her lower back on right side.     Suzanne Harrison is a 69 year old woman who presents for post hospital follow-up from a loculated pleural effusion.  She was admitted from 1/16-1/25 and treated with instilled TPA and dornase x6 doses through a small bore chest tube on the right.  Follow-up imaging showed near complete resolution.  She has residual pain around the catheter insertion site, but no shortness of breath, cough.  She has a small scab around the insertion site but has been leaving it bandaged.  She was discharged on breathing treatments, but would like to know if she is able to stop these.  She does not notice any difference when she has been using them.  She has right-sided abdominal pain when leaning forward; she is unsure if this is related to her gallbladder.  No discomfort with eating.  She has follow-up with general surgery soon.  She has follow-up with her scoliosis and spine surgeon in [facility] in few weeks.  She is up-to-date on her seasonal flu vaccine.     Past Medical History:  Diagnosis Date  . Arthritis   . CKD (chronic kidney disease)   . Gallstones 10/2019  . Hypercholesteremia   . Hypertension   . Loculated pleural effusion 12/2019   right     Family History  Problem Relation Age of Onset  . Stroke Mother   . COPD Mother   . Heart disease Father   . Sleep apnea Brother      Past Surgical History:  Procedure Laterality Date  . BACK SURGERY    . BILIARY DRAINAGE  10/03/2019   ATTEMPTED LAPAROSCOPIC CHOLECYSTECTOMY WITH PLACEMENT OF DRAIN (N/A Abdomen)  .  CHOLECYSTECTOMY N/A 10/03/2019   Procedure: ATTEMPTED LAPAROSCOPIC CHOLECYSTECTOMY WITH PLACEMENT OF DRAIN;  Surgeon: Emelia Loron, MD;  Location: [facility] OR;  Service: General;  Laterality: N/A;  . IR RADIOLOGIST EVAL & MGMT  11/16/2019  . IR RADIOLOGIST EVAL & MGMT  12/13/2019  . IR THORACENTESIS ASP PLEURAL SPACE W/IMG GUIDE  12/18/2019  . REPLACEMENT TOTAL HIP W/  RESURFACING IMPLANTS      Social History   Socioeconomic History  . Marital status: Married    Spouse name: Not on file  . Number of children: Not on file  . Years of education: Not on file  . Highest education level: Not on file  Occupational History  . Not on file  Tobacco Use  . Smoking status: Never Smoker  . Smokeless tobacco: Never Used  Substance and Sexual Activity  . Alcohol use: No  . Drug use: No  . Sexual activity: Not on file  Other Topics Concern  . Not on file  Social History Narrative  . Not on file   Social Determinants of Health   Financial Resource Strain:   . Difficulty of Paying Living Expenses: Not on file  Food Insecurity:   . Worried About Programme researcher, broadcasting/film/video in the Last Year: Not on file  . Ran Out of Food in the Last Year: Not on file  Transportation Needs:   . Lack of  Transportation (Medical): Not on file  . Lack of Transportation (Non-Medical): Not on file  Physical Activity:   . Days of Exercise per Week: Not on file  . Minutes of Exercise per Session: Not on file  Stress:   . Feeling of Stress : Not on file  Social Connections:   . Frequency of Communication with Friends and Family: Not on file  . Frequency of Social Gatherings with Friends and Family: Not on file  . Attends Religious Services: Not on file  . Active Member of Clubs or Organizations: Not on file  . Attends Banker Meetings: Not on file  . Marital Status: Not on file  Intimate Partner Violence:   . Fear of Current or Ex-Partner: Not on file  . Emotionally Abused: Not on file  . Physically  Abused: Not on file  . Sexually Abused: Not on file     Allergies  Allergen Reactions  . Gatifloxacin Rash and Other (See Comments)    Other reaction(s): Myalgias (Muscle Pain) Rash in mouth   . Sulfa Antibiotics Nausea Only     Immunization History  Administered Date(s) Administered  . Fluad Quad(high Dose 65+) 09/02/2019  . Influenza Inj Mdck Quad Pf 09/18/2017  . Influenza-Unspecified 08/30/2014, 08/31/2015    Outpatient Medications Prior to Visit  Medication Sig Dispense Refill  . aspirin EC 81 MG tablet Take 81 mg by mouth at bedtime.    Marland Kitchen atenolol (TENORMIN) 50 MG tablet Take 50 mg by mouth daily.    . Calcium Carbonate-Vitamin D (CALCIUM-D PO) Take 1-2 tablets by mouth See admin instructions. Take 2 tablets by mouth every morning and 1 tablet at night    . cetirizine (ZYRTEC) 10 MG tablet Take 10 mg by mouth daily.    . citalopram (CELEXA) 20 MG tablet Take 20 mg by mouth daily.  2  . DENTA 5000 PLUS 1.1 % CREA dental cream Place 1 application onto teeth 2 (two) times daily. Do not eat, drink or rinse for 30 minutes after use.    Marland Kitchen guaiFENesin-dextromethorphan (ROBITUSSIN DM) 100-10 MG/5ML syrup Take 5 mLs by mouth every 6 (six) hours as needed for cough. 118 mL 0  . ipratropium-albuterol (DUONEB) 0.5-2.5 (3) MG/3ML SOLN Take 3 mLs by nebulization every 6 (six) hours as needed for up to 15 days. 360 mL 0  . levothyroxine (SYNTHROID, LEVOTHROID) 50 MCG tablet Take 50 mcg by mouth daily.  2  . Lifitegrast (XIIDRA) 5 % SOLN Place 1 drop into both eyes 2 (two) times daily.    Marland Kitchen lisinopril (PRINIVIL,ZESTRIL) 5 MG tablet Take 5 mg by mouth daily.  5  . Multiple Vitamin (MULTIVITAMIN WITH MINERALS) TABS tablet Take 1 tablet by mouth daily.     . Multiple Vitamins-Minerals (HAIR/SKIN/NAILS/BIOTIN) TABS Take 1 tablet by mouth daily.     Marland Kitchen oxybutynin (DITROPAN-XL) 10 MG 24 hr tablet Take 10 mg by mouth daily.  11  . pantoprazole (PROTONIX) 40 MG tablet Take 40 mg by mouth 2 (two)  times daily.  2  . polyvinyl alcohol (ARTIFICIAL TEARS) 1.4 % ophthalmic solution Place 1 drop into both eyes 5 (five) times daily.    . pregabalin (LYRICA) 150 MG capsule Take 150 mg by mouth 2 (two) times daily.     . rosuvastatin (CRESTOR) 10 MG tablet Take 10 mg by mouth at bedtime.   5   No facility-administered medications prior to visit.    Review of Systems  Constitutional: Negative for fever.  HENT:  Negative.   Respiratory: Negative for cough, shortness of breath and wheezing.   Musculoskeletal:       Right-sided back pain from chest tube site  Neurological: Negative.      Objective:   Vitals:   01/03/20 1104  BP: 130/76  Pulse: 62  Temp: (!) 97.2 F (36.2 C)  TempSrc: Temporal  SpO2: 98%  Weight: 169 lb (76.7 kg)  Height: 5\' 2"  (1.575 m)   98% on   RA BMI Readings from Last 3 Encounters:  01/03/20 30.91 kg/m  12/16/19 32.19 kg/m  10/03/19 31.09 kg/m   Wt Readings from Last 3 Encounters:  01/03/20 169 lb (76.7 kg)  12/16/19 176 lb (79.8 kg)  10/03/19 170 lb (77.1 kg)    Physical Exam Vitals reviewed.  Constitutional:      Appearance: Normal appearance. She is not ill-appearing.  HENT:     Head: Normocephalic and atraumatic.  Eyes:     General: No scleral icterus. Cardiovascular:     Rate and Rhythm: Normal rate and regular rhythm.     Heart sounds: No murmur.  Pulmonary:     Comments: Breathing comfortably on room air, no conversational dyspnea.  Clear to auscultation bilaterally. Abdominal:     General: There is no distension.     Palpations: Abdomen is soft.     Comments: No tenderness to palpation or guarding.  Musculoskeletal:        General: No deformity.     Cervical back: Neck supple.     Comments: Mild symmetric dependent edema  Lymphadenopathy:     Cervical: No cervical adenopathy.  Skin:    General: Skin is warm and dry.     Findings: No rash.  Neurological:     General: No focal deficit present.     Mental Status: She is  alert.     Coordination: Coordination normal.  Psychiatric:        Mood and Affect: Mood normal.        Behavior: Behavior normal.      CBC    Component Value Date/Time   WBC 6.0 12/25/2019 0112   RBC 4.04 12/25/2019 0112   HGB 10.6 (L) 12/25/2019 0112   HCT 33.8 (L) 12/25/2019 0112   PLT 329 12/25/2019 0112   MCV 83.7 12/25/2019 0112   MCH 26.2 12/25/2019 0112   MCHC 31.4 12/25/2019 0112   RDW 14.8 12/25/2019 0112   LYMPHSABS 1.7 12/23/2019 0323   MONOABS 0.6 12/23/2019 0323   EOSABS 0.5 12/23/2019 0323   BASOSABS 0.0 12/23/2019 0323    CHEMISTRY No results for input(s): NA, K, CL, CO2, GLUCOSE, BUN, CREATININE, CALCIUM, MG, PHOS in the last 168 hours. Estimated Creatinine Clearance: 48.4 mL/min (A) (by C-G formula based on SCr of 1.08 mg/dL (H)).  Pleural fluid studies 12/18/19-moderate WBC, negative cultures. WBC 2880 (60% PMNs) Pleural fluid studies 12/21/19-few WBC, negative cultures. WBC 963 (46% PMNs)  Chest Imaging- films reviewed: CT chest 12/24/19-minimal anterior right lower lobe pneumothorax, minimal posterior pleural thickening versus small amount of pleural fluid.  CXR 01/03/2020- minimal blunting of right costophrenic angle, mild lateral scarring.  Spinal hardware in place, with her fracture and hardware noted by radiology.  Pulmonary Functions Testing Results: No flowsheet data found.  Pathology-pleural fluid 12/19/19: acute inflammation, no malignant cells     Assessment & Plan:     ICD-10-CM   1. Loculated pleural effusion  J90     Previous loculated pleural effusion, likely post infectious/inflammatory from acute cholecystitis.  Resolved with intrapleural lytics. -No additional follow-up required.  She was alerted that she will likely always have a mild abnormality of her x-ray on the right side, but this would be unlikely to cause significant pulmonary symptoms. -Okay to use nebulizer as needed -Post- chest tube pain should resolve in the coming  months.  No additional evaluation required.  A copy of x-ray report provided to give to her spine surgeon at [facility] as he is unable to access our records.  Follow-up as needed.   Current Outpatient Medications:  .  aspirin EC 81 MG tablet, Take 81 mg by mouth at bedtime., Disp: , Rfl:  .  atenolol (TENORMIN) 50 MG tablet, Take 50 mg by mouth daily., Disp: , Rfl:  .  Calcium Carbonate-Vitamin D (CALCIUM-D PO), Take 1-2 tablets by mouth See admin instructions. Take 2 tablets by mouth every morning and 1 tablet at night, Disp: , Rfl:  .  cetirizine (ZYRTEC) 10 MG tablet, Take 10 mg by mouth daily., Disp: , Rfl:  .  citalopram (CELEXA) 20 MG tablet, Take 20 mg by mouth daily., Disp: , Rfl: 2 .  DENTA 5000 PLUS 1.1 % CREA dental cream, Place 1 application onto teeth 2 (two) times daily. Do not eat, drink or rinse for 30 minutes after use., Disp: , Rfl:  .  guaiFENesin-dextromethorphan (ROBITUSSIN DM) 100-10 MG/5ML syrup, Take 5 mLs by mouth every 6 (six) hours as needed for cough., Disp: 118 mL, Rfl: 0 .  ipratropium-albuterol (DUONEB) 0.5-2.5 (3) MG/3ML SOLN, Take 3 mLs by nebulization every 6 (six) hours as needed for up to 15 days., Disp: 360 mL, Rfl: 0 .  levothyroxine (SYNTHROID, LEVOTHROID) 50 MCG tablet, Take 50 mcg by mouth daily., Disp: , Rfl: 2 .  Lifitegrast (XIIDRA) 5 % SOLN, Place 1 drop into both eyes 2 (two) times daily., Disp: , Rfl:  .  lisinopril (PRINIVIL,ZESTRIL) 5 MG tablet, Take 5 mg by mouth daily., Disp: , Rfl: 5 .  Multiple Vitamin (MULTIVITAMIN WITH MINERALS) TABS tablet, Take 1 tablet by mouth daily. , Disp: , Rfl:  .  Multiple Vitamins-Minerals (HAIR/SKIN/NAILS/BIOTIN) TABS, Take 1 tablet by mouth daily. , Disp: , Rfl:  .  oxybutynin (DITROPAN-XL) 10 MG 24 hr tablet, Take 10 mg by mouth daily., Disp: , Rfl: 11 .  pantoprazole (PROTONIX) 40 MG tablet, Take 40 mg by mouth 2 (two) times daily., Disp: , Rfl: 2 .  polyvinyl alcohol (ARTIFICIAL TEARS) 1.4 % ophthalmic  solution, Place 1 drop into both eyes 5 (five) times daily., Disp: , Rfl:  .  pregabalin (LYRICA) 150 MG capsule, Take 150 mg by mouth 2 (two) times daily. , Disp: , Rfl:  .  rosuvastatin (CRESTOR) 10 MG tablet, Take 10 mg by mouth at bedtime. , Disp: , Rfl: 5   Julian Hy, DO [facility] 01/03/2020 1:15 PM

## 2020-01-25 ENCOUNTER — Ambulatory Visit: Payer: 59 | Attending: Internal Medicine

## 2020-01-25 DIAGNOSIS — Z23 Encounter for immunization: Secondary | ICD-10-CM | POA: Insufficient documentation

## 2020-01-25 NOTE — Progress Notes (Signed)
   Covid-19 Vaccination Clinic  Name:  Suzanne Harrison    MRN: [id number] DOB: 07/08/1951  01/25/2020  Suzanne Harrison was observed post Covid-19 immunization for 15 minutes without incidence. She was provided with Vaccine Information Sheet and instruction to access the V-Safe system.   Suzanne Harrison was instructed to call 911 with any severe reactions post vaccine: Marland Kitchen Difficulty breathing  . Swelling of your face and throat  . A fast heartbeat  . A bad rash all over your body  . Dizziness and weakness    Immunizations Administered    Name Date Dose VIS Date Route   Pfizer COVID-19 Vaccine 01/25/2020  1:29 PM 0.3 mL 11/10/2019 Intramuscular   Manufacturer: [employer], [employer]   Lot: [id number]   NDC: [address]-9

## 2020-01-29 ENCOUNTER — Telehealth: Payer: Self-pay | Admitting: Critical Care Medicine

## 2020-01-29 NOTE — Telephone Encounter (Signed)
Called and spoke with pt. Pt stated she would like to have her recent images mailed to her address. I verified pt's mailing address and placed in mail for pt. Nothing further needed.

## 2020-01-31 ENCOUNTER — Other Ambulatory Visit: Payer: Self-pay | Admitting: General Surgery

## 2020-01-31 ENCOUNTER — Other Ambulatory Visit (HOSPITAL_COMMUNITY): Payer: Self-pay | Admitting: General Surgery

## 2020-01-31 DIAGNOSIS — K8020 Calculus of gallbladder without cholecystitis without obstruction: Secondary | ICD-10-CM

## 2020-02-15 ENCOUNTER — Encounter (HOSPITAL_COMMUNITY): Payer: Self-pay

## 2020-02-15 ENCOUNTER — Other Ambulatory Visit: Payer: Self-pay

## 2020-02-15 ENCOUNTER — Encounter (HOSPITAL_COMMUNITY)
Admission: RE | Admit: 2020-02-15 | Discharge: 2020-02-15 | Disposition: A | Discharge status: Home / Self Care | Payer: 59 | Source: Ambulatory Visit | Attending: General Surgery | Admitting: General Surgery

## 2020-02-15 DIAGNOSIS — K8020 Calculus of gallbladder without cholecystitis without obstruction: Secondary | ICD-10-CM | POA: Insufficient documentation

## 2020-02-20 ENCOUNTER — Ambulatory Visit: Payer: 59 | Attending: Internal Medicine

## 2020-02-20 DIAGNOSIS — Z23 Encounter for immunization: Secondary | ICD-10-CM

## 2020-02-20 NOTE — Progress Notes (Signed)
   Covid-19 Vaccination Clinic  Name:  Suzanne Harrison    MRN: [id number] DOB: 1951/05/13  02/20/2020  Suzanne Harrison was observed post Covid-19 immunization for 15 minutes without incident. She was provided with Vaccine Information Sheet and instruction to access the V-Safe system.   Suzanne Harrison was instructed to call 911 with any severe reactions post vaccine: Marland Kitchen Difficulty breathing  . Swelling of face and throat  . A fast heartbeat  . A bad rash all over body  . Dizziness and weakness   Immunizations Administered    Name Date Dose VIS Date Route   Pfizer COVID-19 Vaccine 02/20/2020  1:34 PM 0.3 mL 11/10/2019 Intramuscular   Manufacturer: [employer], [employer]   Lot: [id number]   NDC: [address]-3

## 2020-03-01 ENCOUNTER — Encounter (HOSPITAL_COMMUNITY): Admission: RE | Admit: 2020-03-01 | Payer: 59 | Source: Ambulatory Visit

## 2020-03-07 ENCOUNTER — Ambulatory Visit (HOSPITAL_COMMUNITY): Payer: 59

## 2020-03-19 ENCOUNTER — Other Ambulatory Visit: Payer: Self-pay

## 2020-03-19 ENCOUNTER — Encounter (HOSPITAL_COMMUNITY)
Admission: RE | Admit: 2020-03-19 | Discharge: 2020-03-19 | Disposition: A | Discharge status: Home / Self Care | Payer: 59 | Source: Ambulatory Visit | Attending: General Surgery | Admitting: General Surgery

## 2020-03-19 DIAGNOSIS — K8020 Calculus of gallbladder without cholecystitis without obstruction: Secondary | ICD-10-CM

## 2020-10-03 IMAGING — NM NM HEPATO W/GB/PHARM/[PERSON_NAME]
2 series · 12 of 12 positions shown · non-contrast
Comparison: CT 12/23/2018

CLINICAL DATA: Cholecystitis.  Gallbladder dysfunction

EXAM:
NUCLEAR MEDICINE HEPATOBILIARY IMAGING WITH GALLBLADDER EF
TECHNIQUE: Sequential images of the abdomen were obtained [DATE] minutes
following intravenous administration of radiopharmaceutical. After
oral ingestion of Ensure, gallbladder ejection fraction was
determined. At 60 min, normal ejection fraction is greater than 33%.
RADIOPHARMACEUTICALS:  5.2 mCi 4c-99m  Choletec IV

[he hepatobiliary · 4.52mm/px · 6 of 60 frames shown (1 of 2)]
[frame 6/60]
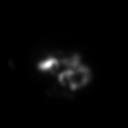
[frame 16/60]
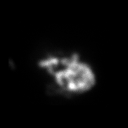
[frame 26/60]
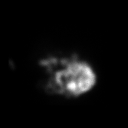
[frame 36/60]
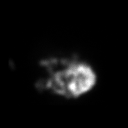
[frame 46/60]
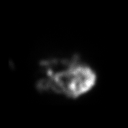
[frame 56/60]
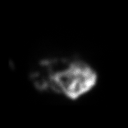

[he hepatobiliary · 4.52mm/px · 6 of 60 frames shown (2 of 2)]
[frame 6/60]
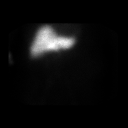
[frame 16/60]
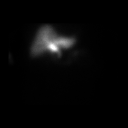
[frame 26/60]
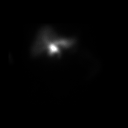
[frame 36/60]
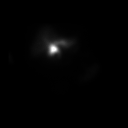
[frame 46/60]
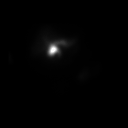
[frame 56/60]
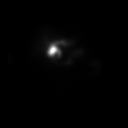

[12 of 12 positions shown; findings below may reference images not displayed]

FINDINGS: Prompt clearance radiotracer from the blood pool and homogeneous
uptake in liver. Counts are evident in the small bowel by 25
minutes. The gallbladder begins to fill at 25 minutes. Gallbladder
is very central therefore more delayed imaging was performed at 90
minutes. At 90 minutes a fatty meal was administered the gallbladder
contracted appropriately.

Calculated gallbladder ejection fraction is 91%. (Normal gallbladder
ejection fraction with Ensure is greater than 33%.)
IMPRESSION: 1. Patent cystic duct and common bile duct.
2. Normal gallbladder ejection fraction.

## 2021-12-03 ENCOUNTER — Ambulatory Visit
Admission: RE | Admit: 2021-12-03 | Discharge: 2021-12-03 | Disposition: A | Discharge status: Home / Self Care | Payer: 59 | Source: Ambulatory Visit | Attending: Family Medicine | Admitting: Family Medicine

## 2021-12-03 ENCOUNTER — Other Ambulatory Visit: Payer: Self-pay | Admitting: Family Medicine

## 2021-12-03 DIAGNOSIS — R059 Cough, unspecified: Secondary | ICD-10-CM

## 2022-06-19 IMAGING — DX DG CHEST 2V
2 series · 2 of 2 positions shown · non-contrast
Comparison: October 12, 2020.

CLINICAL DATA: Cough, unspecified type

EXAM:
CHEST - 2 VIEW

[dg chest 2 view (1 of 2)]
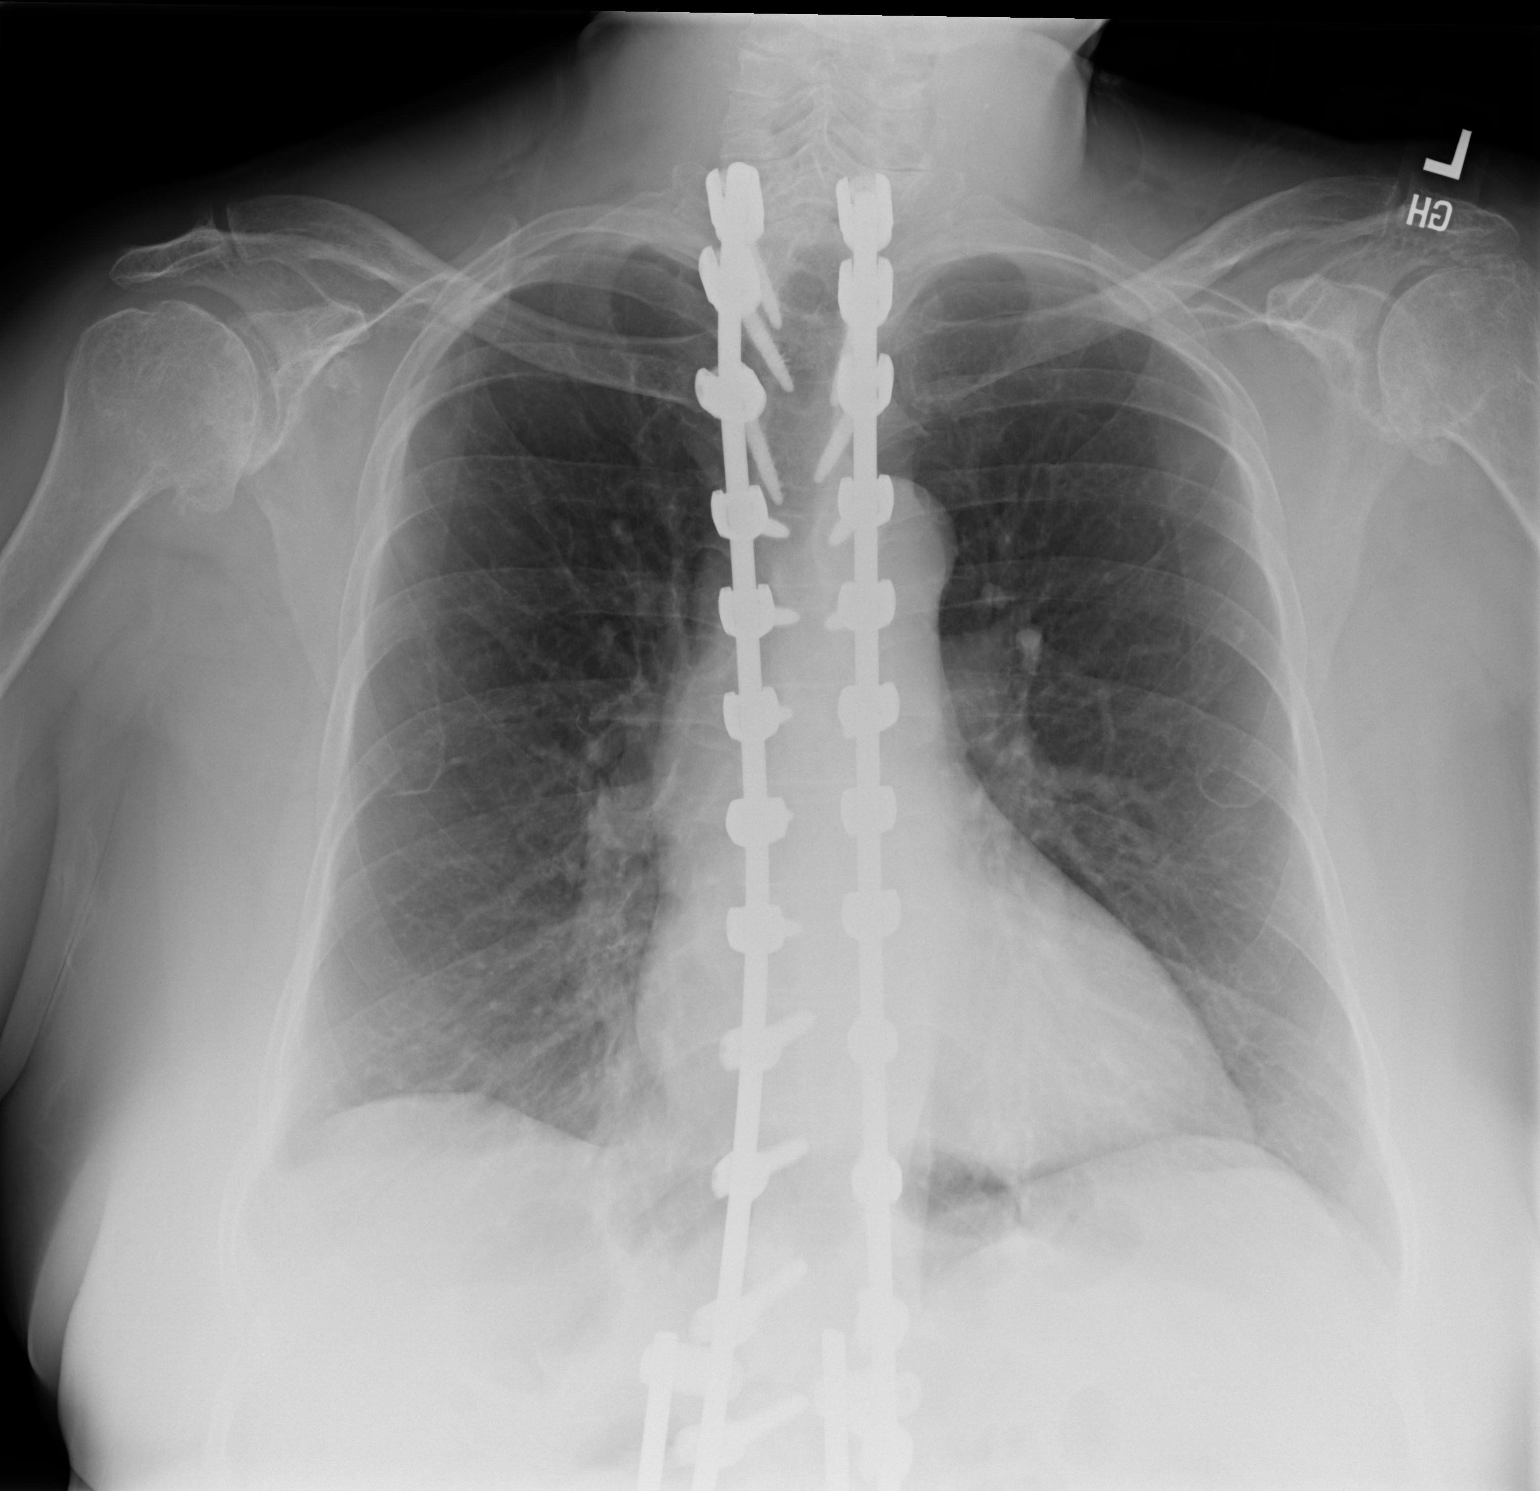

[dg chest 2 view (2 of 2)]
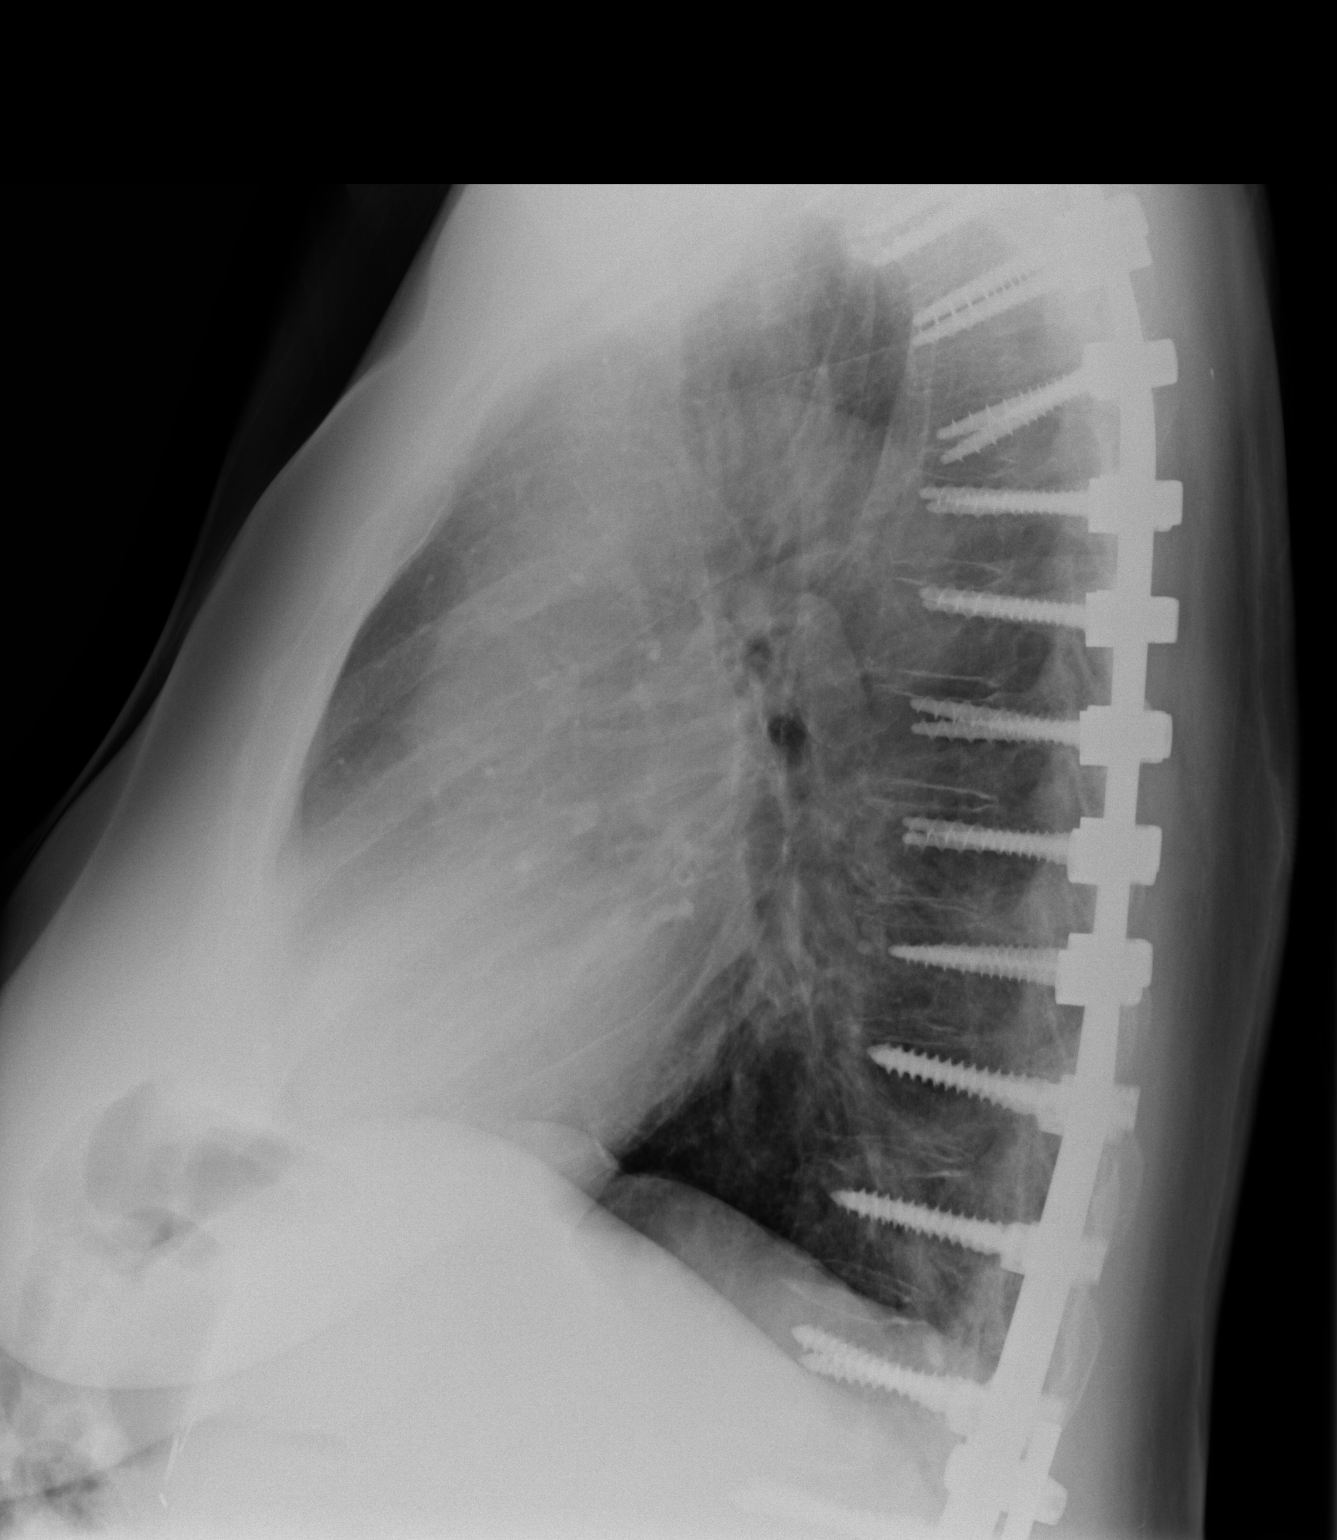

[2 of 2 positions shown; findings below may reference images not displayed]

FINDINGS: No visible pleural effusions or pneumothorax. No consolidation.
Cardiomediastinal silhouette is within normal limits and similar to
prior. Chronic posterior spinal hardware, partially imaged.
Bilateral shoulder and spine degenerative change.
IMPRESSION: No evidence of acute cardiopulmonary disease.

## 2022-10-06 ENCOUNTER — Encounter (HOSPITAL_COMMUNITY): Payer: Self-pay

## 2022-10-06 ENCOUNTER — Emergency Department (HOSPITAL_COMMUNITY): Payer: 59

## 2022-10-06 ENCOUNTER — Other Ambulatory Visit: Payer: Self-pay

## 2022-10-06 ENCOUNTER — Inpatient Hospital Stay (HOSPITAL_COMMUNITY)
Admission: EM | Admit: 2022-10-06 | Discharge: 2022-10-10 | DRG: 394 | Disposition: A | Discharge status: Home / Self Care | Payer: 59 | Attending: Family Medicine | Admitting: Family Medicine

## 2022-10-06 DIAGNOSIS — Z8249 Family history of ischemic heart disease and other diseases of the circulatory system: Secondary | ICD-10-CM

## 2022-10-06 DIAGNOSIS — Z7982 Long term (current) use of aspirin: Secondary | ICD-10-CM

## 2022-10-06 DIAGNOSIS — Z8679 Personal history of other diseases of the circulatory system: Secondary | ICD-10-CM

## 2022-10-06 DIAGNOSIS — E876 Hypokalemia: Secondary | ICD-10-CM | POA: Diagnosis not present

## 2022-10-06 DIAGNOSIS — R6520 Severe sepsis without septic shock: Secondary | ICD-10-CM | POA: Diagnosis present

## 2022-10-06 DIAGNOSIS — E7800 Pure hypercholesterolemia, unspecified: Secondary | ICD-10-CM | POA: Diagnosis present

## 2022-10-06 DIAGNOSIS — A419 Sepsis, unspecified organism: Principal | ICD-10-CM | POA: Diagnosis present

## 2022-10-06 DIAGNOSIS — N179 Acute kidney failure, unspecified: Secondary | ICD-10-CM | POA: Diagnosis present

## 2022-10-06 DIAGNOSIS — K922 Gastrointestinal hemorrhage, unspecified: Secondary | ICD-10-CM | POA: Diagnosis present

## 2022-10-06 DIAGNOSIS — E785 Hyperlipidemia, unspecified: Secondary | ICD-10-CM | POA: Diagnosis present

## 2022-10-06 DIAGNOSIS — E669 Obesity, unspecified: Secondary | ICD-10-CM | POA: Diagnosis present

## 2022-10-06 DIAGNOSIS — Z96649 Presence of unspecified artificial hip joint: Secondary | ICD-10-CM | POA: Diagnosis present

## 2022-10-06 DIAGNOSIS — D638 Anemia in other chronic diseases classified elsewhere: Secondary | ICD-10-CM | POA: Diagnosis present

## 2022-10-06 DIAGNOSIS — G894 Chronic pain syndrome: Secondary | ICD-10-CM | POA: Diagnosis present

## 2022-10-06 DIAGNOSIS — K838 Other specified diseases of biliary tract: Secondary | ICD-10-CM | POA: Diagnosis present

## 2022-10-06 DIAGNOSIS — Z87440 Personal history of urinary (tract) infections: Secondary | ICD-10-CM

## 2022-10-06 DIAGNOSIS — K625 Hemorrhage of anus and rectum: Secondary | ICD-10-CM

## 2022-10-06 DIAGNOSIS — E871 Hypo-osmolality and hyponatremia: Secondary | ICD-10-CM | POA: Diagnosis present

## 2022-10-06 DIAGNOSIS — K559 Vascular disorder of intestine, unspecified: Secondary | ICD-10-CM | POA: Diagnosis not present

## 2022-10-06 DIAGNOSIS — Z79890 Hormone replacement therapy (postmenopausal): Secondary | ICD-10-CM

## 2022-10-06 DIAGNOSIS — Z6830 Body mass index (BMI) 30.0-30.9, adult: Secondary | ICD-10-CM

## 2022-10-06 DIAGNOSIS — A09 Infectious gastroenteritis and colitis, unspecified: Secondary | ICD-10-CM | POA: Diagnosis present

## 2022-10-06 DIAGNOSIS — Z9049 Acquired absence of other specified parts of digestive tract: Secondary | ICD-10-CM

## 2022-10-06 DIAGNOSIS — I10 Essential (primary) hypertension: Secondary | ICD-10-CM | POA: Diagnosis present

## 2022-10-06 DIAGNOSIS — Z79899 Other long term (current) drug therapy: Secondary | ICD-10-CM

## 2022-10-06 DIAGNOSIS — K529 Noninfective gastroenteritis and colitis, unspecified: Principal | ICD-10-CM

## 2022-10-06 DIAGNOSIS — Z825 Family history of asthma and other chronic lower respiratory diseases: Secondary | ICD-10-CM

## 2022-10-06 DIAGNOSIS — Z823 Family history of stroke: Secondary | ICD-10-CM

## 2022-10-06 DIAGNOSIS — E039 Hypothyroidism, unspecified: Secondary | ICD-10-CM | POA: Diagnosis present

## 2022-10-06 DIAGNOSIS — D62 Acute posthemorrhagic anemia: Secondary | ICD-10-CM | POA: Diagnosis present

## 2022-10-06 DIAGNOSIS — Z882 Allergy status to sulfonamides status: Secondary | ICD-10-CM

## 2022-10-06 LAB — COMPREHENSIVE METABOLIC PANEL
ALT: 11 U/L (ref 0–44)
AST: 19 U/L (ref 15–41)
Albumin: 3.3 g/dL — ABNORMAL LOW (ref 3.5–5.0)
Alkaline Phosphatase: 72 U/L (ref 38–126)
Anion gap: 11 (ref 5–15)
BUN: 18 mg/dL (ref 8–23)
CO2: 19 mmol/L — ABNORMAL LOW (ref 22–32)
Calcium: 9.1 mg/dL (ref 8.9–10.3)
Chloride: 100 mmol/L (ref 98–111)
Creatinine, Ser: 1.53 mg/dL — ABNORMAL HIGH (ref 0.44–1.00)
GFR, Estimated: 37 mL/min — ABNORMAL LOW (ref >60)
Glucose, Bld: 160 mg/dL — ABNORMAL HIGH (ref 70–99)
Potassium: 4.8 mmol/L (ref 3.5–5.1)
Sodium: 130 mmol/L — ABNORMAL LOW (ref 135–145)
Total Bilirubin: 0.8 mg/dL (ref 0.3–1.2)
Total Protein: 6.3 g/dL — ABNORMAL LOW (ref 6.5–8.1)

## 2022-10-06 LAB — CBC WITH DIFFERENTIAL/PLATELET
Abs Immature Granulocytes: 0.05 10*3/uL (ref 0.00–0.07)
Basophils Absolute: 0 10*3/uL (ref 0.0–0.1)
Basophils Relative: 0 %
Eosinophils Absolute: 0 10*3/uL (ref 0.0–0.5)
Eosinophils Relative: 0 %
HCT: 42.1 % (ref 36.0–46.0)
Hemoglobin: 13.9 g/dL (ref 12.0–15.0)
Immature Granulocytes: 0 %
Lymphocytes Relative: 7 %
Lymphs Abs: 1 10*3/uL (ref 0.7–4.0)
MCH: 27.6 pg (ref 26.0–34.0)
MCHC: 33 g/dL (ref 30.0–36.0)
MCV: 83.7 fL (ref 80.0–100.0)
Monocytes Absolute: 1 10*3/uL (ref 0.1–1.0)
Monocytes Relative: 8 %
Neutro Abs: 11.5 10*3/uL — ABNORMAL HIGH (ref 1.7–7.7)
Neutrophils Relative %: 85 %
Platelets: 196 10*3/uL (ref 150–400)
RBC: 5.03 MIL/uL (ref 3.87–5.11)
RDW: 14.2 % (ref 11.5–15.5)
WBC: 13.6 10*3/uL — ABNORMAL HIGH (ref 4.0–10.5)
nRBC: 0 % (ref 0.0–0.2)

## 2022-10-06 LAB — PROTIME-INR
INR: 1.1 (ref 0.8–1.2)
Prothrombin Time: 13.8 seconds (ref 11.4–15.2)

## 2022-10-06 LAB — LIPASE, BLOOD: Lipase: 28 U/L (ref 11–51)

## 2022-10-06 LAB — TYPE AND SCREEN
ABO/RH(D): A POS
Antibody Screen: NEGATIVE

## 2022-10-06 LAB — ABO/RH: ABO/RH(D): A POS

## 2022-10-06 MED ORDER — IOHEXOL 350 MG/ML SOLN
75.0000 mL | Freq: Once | INTRAVENOUS | Status: AC | PRN
  Administered 2022-10-06: 75 mL via INTRAVENOUS

## 2022-10-06 NOTE — ED Triage Notes (Signed)
Reports rectal bleeding that started yewsterday and went approx 6 times but diarrhea has stomach.  Reports having vomiting with bile.  Complains of lower abd pain.

## 2022-10-06 NOTE — ED Provider Triage Note (Signed)
Emergency Medicine Provider Triage Evaluation Note  Suzanne Harrison , a 71 y.o. female  was evaluated in triage.  Pt complains of nausea, vomiting and blood in her stool.  Is bright red.  Denies any blood in the vomit.  She also having left-sided abdominal pain.  Not on blood thinners..  Review of Systems  Per HPI  Physical Exam  BP (!) 110/51 (BP Location: Right Arm)   Pulse 75   Temp 97.8 F (36.6 C) (Oral)   Resp 18   LMP  (LMP Unknown)   SpO2 96%  Gen:   Awake, no distress. Pale, ill appearing  Resp:  Normal effort  MSK:   Moves extremities without difficulty  Other:  Left-sided abdominal pain.  Medical Decision Making  Medically screening exam initiated at 12:44 PM.  Appropriate orders placed.  Suzanne Harrison was informed that the remainder of the evaluation will be completed by another provider, this initial triage assessment does not replace that evaluation, and the importance of remaining in the ED until their evaluation is complete.     Sherrill Raring, PA-C 10/06/22 1245

## 2022-10-07 ENCOUNTER — Encounter (HOSPITAL_COMMUNITY): Payer: Self-pay | Admitting: Internal Medicine

## 2022-10-07 ENCOUNTER — Inpatient Hospital Stay (HOSPITAL_COMMUNITY): Payer: 59

## 2022-10-07 DIAGNOSIS — Z79890 Hormone replacement therapy (postmenopausal): Secondary | ICD-10-CM | POA: Diagnosis not present

## 2022-10-07 DIAGNOSIS — I10 Essential (primary) hypertension: Secondary | ICD-10-CM | POA: Diagnosis present

## 2022-10-07 DIAGNOSIS — E039 Hypothyroidism, unspecified: Secondary | ICD-10-CM | POA: Diagnosis present

## 2022-10-07 DIAGNOSIS — Z79899 Other long term (current) drug therapy: Secondary | ICD-10-CM | POA: Diagnosis not present

## 2022-10-07 DIAGNOSIS — Z96649 Presence of unspecified artificial hip joint: Secondary | ICD-10-CM | POA: Diagnosis present

## 2022-10-07 DIAGNOSIS — Z825 Family history of asthma and other chronic lower respiratory diseases: Secondary | ICD-10-CM | POA: Diagnosis not present

## 2022-10-07 DIAGNOSIS — K922 Gastrointestinal hemorrhage, unspecified: Secondary | ICD-10-CM | POA: Diagnosis present

## 2022-10-07 DIAGNOSIS — K529 Noninfective gastroenteritis and colitis, unspecified: Principal | ICD-10-CM | POA: Diagnosis present

## 2022-10-07 DIAGNOSIS — R6520 Severe sepsis without septic shock: Secondary | ICD-10-CM | POA: Diagnosis present

## 2022-10-07 DIAGNOSIS — G894 Chronic pain syndrome: Secondary | ICD-10-CM | POA: Diagnosis present

## 2022-10-07 DIAGNOSIS — N179 Acute kidney failure, unspecified: Secondary | ICD-10-CM | POA: Diagnosis present

## 2022-10-07 DIAGNOSIS — Z87440 Personal history of urinary (tract) infections: Secondary | ICD-10-CM | POA: Diagnosis not present

## 2022-10-07 DIAGNOSIS — Z7982 Long term (current) use of aspirin: Secondary | ICD-10-CM | POA: Diagnosis not present

## 2022-10-07 DIAGNOSIS — Z8679 Personal history of other diseases of the circulatory system: Secondary | ICD-10-CM | POA: Diagnosis not present

## 2022-10-07 DIAGNOSIS — E876 Hypokalemia: Secondary | ICD-10-CM | POA: Diagnosis not present

## 2022-10-07 DIAGNOSIS — D638 Anemia in other chronic diseases classified elsewhere: Secondary | ICD-10-CM | POA: Diagnosis present

## 2022-10-07 DIAGNOSIS — K838 Other specified diseases of biliary tract: Secondary | ICD-10-CM | POA: Diagnosis present

## 2022-10-07 DIAGNOSIS — Z882 Allergy status to sulfonamides status: Secondary | ICD-10-CM | POA: Diagnosis not present

## 2022-10-07 DIAGNOSIS — Z9049 Acquired absence of other specified parts of digestive tract: Secondary | ICD-10-CM | POA: Diagnosis not present

## 2022-10-07 DIAGNOSIS — Z6830 Body mass index (BMI) 30.0-30.9, adult: Secondary | ICD-10-CM | POA: Diagnosis not present

## 2022-10-07 DIAGNOSIS — E871 Hypo-osmolality and hyponatremia: Secondary | ICD-10-CM | POA: Diagnosis present

## 2022-10-07 DIAGNOSIS — A419 Sepsis, unspecified organism: Secondary | ICD-10-CM

## 2022-10-07 DIAGNOSIS — D62 Acute posthemorrhagic anemia: Secondary | ICD-10-CM | POA: Diagnosis present

## 2022-10-07 DIAGNOSIS — Z823 Family history of stroke: Secondary | ICD-10-CM | POA: Diagnosis not present

## 2022-10-07 DIAGNOSIS — E782 Mixed hyperlipidemia: Secondary | ICD-10-CM

## 2022-10-07 DIAGNOSIS — E669 Obesity, unspecified: Secondary | ICD-10-CM | POA: Diagnosis present

## 2022-10-07 DIAGNOSIS — K625 Hemorrhage of anus and rectum: Secondary | ICD-10-CM | POA: Diagnosis present

## 2022-10-07 DIAGNOSIS — K559 Vascular disorder of intestine, unspecified: Secondary | ICD-10-CM | POA: Diagnosis present

## 2022-10-07 DIAGNOSIS — Z8249 Family history of ischemic heart disease and other diseases of the circulatory system: Secondary | ICD-10-CM | POA: Diagnosis not present

## 2022-10-07 DIAGNOSIS — E7800 Pure hypercholesterolemia, unspecified: Secondary | ICD-10-CM | POA: Diagnosis present

## 2022-10-07 LAB — CBC WITH DIFFERENTIAL/PLATELET
Abs Immature Granulocytes: 0.03 10*3/uL (ref 0.00–0.07)
Basophils Absolute: 0 10*3/uL (ref 0.0–0.1)
Basophils Relative: 0 %
Eosinophils Absolute: 0 10*3/uL (ref 0.0–0.5)
Eosinophils Relative: 0 %
HCT: 31 % — ABNORMAL LOW (ref 36.0–46.0)
Hemoglobin: 10.1 g/dL — ABNORMAL LOW (ref 12.0–15.0)
Immature Granulocytes: 0 %
Lymphocytes Relative: 15 %
Lymphs Abs: 1.5 10*3/uL (ref 0.7–4.0)
MCH: 28 pg (ref 26.0–34.0)
MCHC: 32.6 g/dL (ref 30.0–36.0)
MCV: 85.9 fL (ref 80.0–100.0)
Monocytes Absolute: 0.8 10*3/uL (ref 0.1–1.0)
Monocytes Relative: 8 %
Neutro Abs: 7.4 10*3/uL (ref 1.7–7.7)
Neutrophils Relative %: 77 %
Platelets: 123 10*3/uL — ABNORMAL LOW (ref 150–400)
RBC: 3.61 MIL/uL — ABNORMAL LOW (ref 3.87–5.11)
RDW: 14.7 % (ref 11.5–15.5)
WBC: 9.7 10*3/uL (ref 4.0–10.5)
nRBC: 0 % (ref 0.0–0.2)

## 2022-10-07 LAB — LACTIC ACID, PLASMA
Lactic Acid, Venous: 1.1 mmol/L (ref 0.5–1.9)
Lactic Acid, Venous: 0.8 mmol/L (ref 0.5–1.9)

## 2022-10-07 LAB — CREATININE, URINE, RANDOM: Creatinine, Urine: 47 mg/dL

## 2022-10-07 LAB — HEMOGLOBIN AND HEMATOCRIT, BLOOD
HCT: 32.6 % — ABNORMAL LOW (ref 36.0–46.0)
HCT: 32.9 % — ABNORMAL LOW (ref 36.0–46.0)
Hemoglobin: 10.7 g/dL — ABNORMAL LOW (ref 12.0–15.0)
Hemoglobin: 10.8 g/dL — ABNORMAL LOW (ref 12.0–15.0)

## 2022-10-07 LAB — CBC
HCT: 30 % — ABNORMAL LOW (ref 36.0–46.0)
Hemoglobin: 9.6 g/dL — ABNORMAL LOW (ref 12.0–15.0)
MCH: 27.9 pg (ref 26.0–34.0)
MCHC: 32 g/dL (ref 30.0–36.0)
MCV: 87.2 fL (ref 80.0–100.0)
Platelets: 134 10*3/uL — ABNORMAL LOW (ref 150–400)
RBC: 3.44 MIL/uL — ABNORMAL LOW (ref 3.87–5.11)
RDW: 14.6 % (ref 11.5–15.5)
WBC: 10.4 10*3/uL (ref 4.0–10.5)
nRBC: 0 % (ref 0.0–0.2)

## 2022-10-07 LAB — COMPREHENSIVE METABOLIC PANEL
ALT: 11 U/L (ref 0–44)
AST: 12 U/L — ABNORMAL LOW (ref 15–41)
Albumin: 2.4 g/dL — ABNORMAL LOW (ref 3.5–5.0)
Alkaline Phosphatase: 54 U/L (ref 38–126)
Anion gap: 5 (ref 5–15)
BUN: 23 mg/dL (ref 8–23)
CO2: 21 mmol/L — ABNORMAL LOW (ref 22–32)
Calcium: 7.8 mg/dL — ABNORMAL LOW (ref 8.9–10.3)
Chloride: 107 mmol/L (ref 98–111)
Creatinine, Ser: 1.81 mg/dL — ABNORMAL HIGH (ref 0.44–1.00)
GFR, Estimated: 30 mL/min — ABNORMAL LOW (ref >60)
Glucose, Bld: 107 mg/dL — ABNORMAL HIGH (ref 70–99)
Potassium: 4.5 mmol/L (ref 3.5–5.1)
Sodium: 133 mmol/L — ABNORMAL LOW (ref 135–145)
Total Bilirubin: 0.7 mg/dL (ref 0.3–1.2)
Total Protein: 4.9 g/dL — ABNORMAL LOW (ref 6.5–8.1)

## 2022-10-07 LAB — MAGNESIUM: Magnesium: 1.7 mg/dL (ref 1.7–2.4)

## 2022-10-07 MED ORDER — OXYCODONE-ACETAMINOPHEN 5-325 MG PO TABS
1.0000 | ORAL_TABLET | Freq: Four times a day (QID) | ORAL | Status: DC | PRN
  Administered 2022-10-09 – 2022-10-10 (×5): 1 via ORAL
  Filled 2022-10-07 (×5): qty 1

## 2022-10-07 MED ORDER — ACETAMINOPHEN 650 MG RE SUPP: 650.0000 mg | Freq: Four times a day (QID) | RECTAL | Status: DC | PRN

## 2022-10-07 MED ORDER — MAGNESIUM SULFATE 2 GM/50ML IV SOLN
2.0000 g | Freq: Once | INTRAVENOUS | Status: AC
  Administered 2022-10-07: 2 g via INTRAVENOUS
  Filled 2022-10-07: qty 50

## 2022-10-07 MED ORDER — SODIUM CHLORIDE 0.9 % IV BOLUS
500.0000 mL | Freq: Once | INTRAVENOUS | Status: AC
  Administered 2022-10-07: 500 mL via INTRAVENOUS

## 2022-10-07 MED ORDER — OXYCODONE HCL 5 MG PO TABS
5.0000 mg | ORAL_TABLET | Freq: Four times a day (QID) | ORAL | Status: DC | PRN
  Administered 2022-10-07 – 2022-10-10 (×5): 5 mg via ORAL
  Filled 2022-10-07 (×5): qty 1

## 2022-10-07 MED ORDER — LACTATED RINGERS IV SOLN: INTRAVENOUS | Status: AC

## 2022-10-07 MED ORDER — PREGABALIN 75 MG PO CAPS
150.0000 mg | ORAL_CAPSULE | Freq: Two times a day (BID) | ORAL | Status: DC
  Administered 2022-10-07 – 2022-10-10 (×7): 150 mg via ORAL
  Filled 2022-10-07 (×7): qty 2

## 2022-10-07 MED ORDER — METRONIDAZOLE 500 MG/100ML IV SOLN
500.0000 mg | Freq: Once | INTRAVENOUS | Status: AC
  Administered 2022-10-07: 500 mg via INTRAVENOUS
  Filled 2022-10-07: qty 100

## 2022-10-07 MED ORDER — MIRABEGRON ER 50 MG PO TB24
50.0000 mg | ORAL_TABLET | Freq: Every day | ORAL | Status: DC
  Administered 2022-10-07 – 2022-10-10 (×4): 50 mg via ORAL
  Filled 2022-10-07 (×6): qty 1

## 2022-10-07 MED ORDER — ACETAMINOPHEN 325 MG PO TABS
650.0000 mg | ORAL_TABLET | Freq: Four times a day (QID) | ORAL | Status: DC | PRN
  Administered 2022-10-07: 650 mg via ORAL
  Filled 2022-10-07: qty 2

## 2022-10-07 MED ORDER — LEVOTHYROXINE SODIUM 50 MCG PO TABS
50.0000 ug | ORAL_TABLET | Freq: Every day | ORAL | Status: DC
  Administered 2022-10-07 – 2022-10-10 (×4): 50 ug via ORAL
  Filled 2022-10-07 (×3): qty 1
  Filled 2022-10-07: qty 2

## 2022-10-07 MED ORDER — MORPHINE SULFATE (PF) 4 MG/ML IV SOLN
4.0000 mg | Freq: Once | INTRAVENOUS | Status: AC
  Administered 2022-10-07: 4 mg via INTRAVENOUS
  Filled 2022-10-07: qty 1

## 2022-10-07 MED ORDER — LACTATED RINGERS IV BOLUS
1000.0000 mL | Freq: Once | INTRAVENOUS | Status: AC
  Administered 2022-10-07: 1000 mL via INTRAVENOUS

## 2022-10-07 MED ORDER — ROSUVASTATIN CALCIUM 5 MG PO TABS
10.0000 mg | ORAL_TABLET | Freq: Every day | ORAL | Status: DC
  Administered 2022-10-07 – 2022-10-09 (×3): 10 mg via ORAL
  Filled 2022-10-07 (×3): qty 2

## 2022-10-07 MED ORDER — HYDROMORPHONE HCL 1 MG/ML IJ SOLN
0.5000 mg | INTRAMUSCULAR | Status: DC | PRN
  Administered 2022-10-07 – 2022-10-08 (×3): 0.5 mg via INTRAVENOUS
  Filled 2022-10-07 (×4): qty 1

## 2022-10-07 MED ORDER — ONDANSETRON HCL 4 MG/2ML IJ SOLN: 4.0000 mg | Freq: Four times a day (QID) | INTRAMUSCULAR | Status: DC | PRN

## 2022-10-07 MED ORDER — SODIUM CHLORIDE 0.9 % IV BOLUS
1000.0000 mL | Freq: Once | INTRAVENOUS | Status: AC
  Administered 2022-10-07: 1000 mL via INTRAVENOUS

## 2022-10-07 MED ORDER — IPRATROPIUM-ALBUTEROL 0.5-2.5 (3) MG/3ML IN SOLN: 3.0000 mL | Freq: Four times a day (QID) | RESPIRATORY_TRACT | Status: DC | PRN

## 2022-10-07 MED ORDER — METRONIDAZOLE 500 MG/100ML IV SOLN
500.0000 mg | Freq: Two times a day (BID) | INTRAVENOUS | Status: DC
  Administered 2022-10-07 – 2022-10-10 (×7): 500 mg via INTRAVENOUS
  Filled 2022-10-07 (×9): qty 100

## 2022-10-07 MED ORDER — ONDANSETRON HCL 4 MG/2ML IJ SOLN
4.0000 mg | Freq: Once | INTRAMUSCULAR | Status: AC
  Administered 2022-10-07: 4 mg via INTRAVENOUS
  Filled 2022-10-07: qty 2

## 2022-10-07 MED ORDER — SODIUM CHLORIDE 0.9 % IV SOLN
2.0000 g | Freq: Once | INTRAVENOUS | Status: AC
  Administered 2022-10-07: 2 g via INTRAVENOUS
  Filled 2022-10-07: qty 20

## 2022-10-07 MED ORDER — PANTOPRAZOLE SODIUM 40 MG PO TBEC
40.0000 mg | DELAYED_RELEASE_TABLET | Freq: Two times a day (BID) | ORAL | Status: DC
  Administered 2022-10-07 – 2022-10-10 (×7): 40 mg via ORAL
  Filled 2022-10-07 (×7): qty 1

## 2022-10-07 MED ORDER — CITALOPRAM HYDROBROMIDE 20 MG PO TABS
40.0000 mg | ORAL_TABLET | Freq: Every day | ORAL | Status: DC
  Administered 2022-10-07 – 2022-10-10 (×4): 40 mg via ORAL
  Filled 2022-10-07: qty 2
  Filled 2022-10-07: qty 4
  Filled 2022-10-07 (×2): qty 2

## 2022-10-07 MED ORDER — SODIUM CHLORIDE 0.9 % IV SOLN
2.0000 g | INTRAVENOUS | Status: DC
  Administered 2022-10-07 – 2022-10-09 (×3): 2 g via INTRAVENOUS
  Filled 2022-10-07 (×4): qty 20

## 2022-10-07 NOTE — ED Provider Notes (Signed)
[facility] EMERGENCY DEPARTMENT Provider Note   CSN: [id number] Arrival date & time: 10/06/22  1151     History  Chief Complaint  Patient presents with   Rectal Bleeding    Suzanne Harrison is a 71 y.o. female.  HPI     This is 71 year old female with a history of hypertension, gallstones, chronic kidney disease who presents with abdominal pain and rectal bleeding.  Patient reports she has had rectal bleeding since Monday.  She reports a significant mount of blood in her stool.  She also reports diarrhea.  She has since developed nonbilious, nonbloody emesis.  She is reporting lower abdominal discomfort.  It is mostly below the umbilicus and to the left.  It does not radiate.  It is not worse with eating.  Patient reports recently being treated for UTI.  She is continuing a course of antibiotics but does not remember the name.  She has not had any fevers.  She has never had a GI bleed in the past.  Denies anticoagulant use but is on an aspirin daily.  Home Medications Prior to Admission medications   Medication Sig Start Date End Date Taking? Authorizing Provider  aspirin EC 81 MG tablet Take 81 mg by mouth at bedtime.    [provider]  atenolol (TENORMIN) 50 MG tablet Take 50 mg by mouth daily. 09/18/19   [provider]  Calcium Carbonate-Vitamin D (CALCIUM-D PO) Take 1-2 tablets by mouth See admin instructions. Take 2 tablets by mouth every morning and 1 tablet at night    [provider]  cetirizine (ZYRTEC) 10 MG tablet Take 10 mg by mouth daily.    [provider]  citalopram (CELEXA) 20 MG tablet Take 20 mg by mouth daily. 02/23/18   [provider]  DENTA 5000 PLUS 1.1 % CREA dental cream Place 1 application onto teeth 2 (two) times daily. Do not eat, drink or rinse for 30 minutes after use. 08/12/19   [provider]  guaiFENesin-dextromethorphan (ROBITUSSIN DM) 100-10 MG/5ML syrup Take 5 mLs by mouth  every 6 (six) hours as needed for cough. 12/25/19   Arrien, York Ram, MD  ipratropium-albuterol (DUONEB) 0.5-2.5 (3) MG/3ML SOLN Take 3 mLs by nebulization every 6 (six) hours as needed for up to 15 days. 12/25/19 01/09/20  Arrien, York Ram, MD  levothyroxine (SYNTHROID, LEVOTHROID) 50 MCG tablet Take 50 mcg by mouth daily. 02/15/18   [provider]  Lifitegrast Benay Spice) 5 % SOLN Place 1 drop into both eyes 2 (two) times daily.    [provider]  lisinopril (PRINIVIL,ZESTRIL) 5 MG tablet Take 5 mg by mouth daily. 02/09/18   [provider]  Multiple Vitamin (MULTIVITAMIN WITH MINERALS) TABS tablet Take 1 tablet by mouth daily.     [provider]  Multiple Vitamins-Minerals (HAIR/SKIN/NAILS/BIOTIN) TABS Take 1 tablet by mouth daily.     [provider]  oxybutynin (DITROPAN-XL) 10 MG 24 hr tablet Take 10 mg by mouth daily. 02/13/18   [provider]  pantoprazole (PROTONIX) 40 MG tablet Take 40 mg by mouth 2 (two) times daily. 02/11/18   [provider]  polyvinyl alcohol (ARTIFICIAL TEARS) 1.4 % ophthalmic solution Place 1 drop into both eyes 5 (five) times daily.    [provider]  pregabalin (LYRICA) 150 MG capsule Take 150 mg by mouth 2 (two) times daily.     [provider]  rosuvastatin (CRESTOR) 10 MG tablet Take 10 mg by mouth  at bedtime.  02/15/18   [provider]      Allergies    Sulfamethazine, Gatifloxacin, and Sulfa antibiotics    Review of Systems   Review of Systems  Constitutional:  Negative for fever.  Respiratory:  Negative for shortness of breath.   Cardiovascular:  Negative for chest pain.  Gastrointestinal:  Positive for abdominal pain, blood in stool, diarrhea, nausea and vomiting.  Genitourinary:  Negative for dysuria.  All other systems reviewed and are negative.   Physical Exam Updated Vital Signs BP (!) 91/45   Pulse 64   Temp 98.4 F (36.9 C) (Oral)   Resp 13    Ht 1.575 m (5\' 2" )   Wt 76.7 kg   LMP  (LMP Unknown)   SpO2 98%   BMI 30.91 kg/m  Physical Exam Vitals and nursing note reviewed.  Constitutional:      Appearance: She is well-developed. She is obese. She is not ill-appearing.  HENT:     Head: Normocephalic and atraumatic.     Mouth/Throat:     Mouth: Mucous membranes are dry.  Eyes:     Pupils: Pupils are equal, round, and reactive to light.  Cardiovascular:     Rate and Rhythm: Normal rate and regular rhythm.     Heart sounds: Normal heart sounds.  Pulmonary:     Effort: Pulmonary effort is normal. No respiratory distress.     Breath sounds: No wheezing.  Abdominal:     General: Bowel sounds are normal.     Palpations: Abdomen is soft.     Tenderness: There is abdominal tenderness.     Comments: Left periumbilical tenderness to palpation, no rebound or guarding  Musculoskeletal:     Cervical back: Neck supple.  Skin:    General: Skin is warm and dry.  Neurological:     Mental Status: She is alert and oriented to person, place, and time.  Psychiatric:        Mood and Affect: Mood normal.     ED Results / Procedures / Treatments   Labs (all labs ordered are listed, but only abnormal results are displayed) Labs Reviewed  CBC WITH DIFFERENTIAL/PLATELET - Abnormal; Notable for the following components:      Result Value   WBC 13.6 (*)    Neutro Abs 11.5 (*)    All other components within normal limits  COMPREHENSIVE METABOLIC PANEL - Abnormal; Notable for the following components:   Sodium 130 (*)    CO2 19 (*)    Glucose, Bld 160 (*)    Creatinine, Ser 1.53 (*)    Total Protein 6.3 (*)    Albumin 3.3 (*)    GFR, Estimated 37 (*)    All other components within normal limits  GASTROINTESTINAL PANEL BY PCR, STOOL (REPLACES STOOL CULTURE)  C DIFFICILE QUICK SCREEN W PCR REFLEX    CULTURE, BLOOD (SINGLE)  LIPASE, BLOOD  PROTIME-INR  LACTIC ACID, PLASMA  URINALYSIS, ROUTINE W REFLEX MICROSCOPIC  LACTIC ACID,  PLASMA  HEMOGLOBIN AND HEMATOCRIT, BLOOD  TYPE AND SCREEN  ABO/RH    EKG None  Radiology CT Abdomen Pelvis W Contrast  Result Date: 10/06/2022 CLINICAL DATA:  Abdominal pain, acute. EXAM: CT ABDOMEN AND PELVIS WITH CONTRAST TECHNIQUE: Multidetector CT imaging of the abdomen and pelvis was performed using the standard protocol following bolus administration of intravenous contrast. RADIATION DOSE REDUCTION: This exam was performed according to the departmental dose-optimization program which includes automated exposure control, adjustment of the mA and/or  kV according to patient size and/or use of iterative reconstruction technique. CONTRAST:  16mL OMNIPAQUE IOHEXOL 350 MG/ML SOLN COMPARISON:  12/16/2019 FINDINGS: Lower chest: No acute findings. Unchanged subpleural nodule within the right lower lobe abutting the major fissure measuring 6 mm. This was present on examination from 12/16/2019 and most likely represents a benign nodule. Hepatobiliary: No focal liver abnormality identified. Gallbladder appears unremarkable. The gallbladder appears unremarkable. A patent common bile duct measures up to 0.9 cm and there is possible filling defect within the distal CBD measuring 5 mm, image 38/3. Pancreas: No pancreatic inflammation, main duct dilatation or mass. Spleen: Normal in size without focal abnormality. Adrenals/Urinary Tract: Normal adrenal glands. Bilateral kidney cysts are identified. The largest arises off the inferior pole of the left kidney measuring 8.4 by 5.1 cm. There also scattered too small to reliably characterize renal hypodensities. No follow-up imaging recommended. No signs of nephrolithiasis or hydronephrosis. Urinary bladder is unremarkable. Stomach/Bowel: Stomach appears within normal limits. The appendix is not visualized and may be surgically absent. There is abnormal bowel wall edema and inflammation involving the distal transverse colon through the sigmoid colon concerning for acute  colitis. There is no sign of pneumatosis or bowel perforation. No evidence for bowel obstruction. Vascular/Lymphatic: Aortic atherosclerotic calcifications. No signs of abdominopelvic adenopathy. Reproductive: Uterus and bilateral adnexa are unremarkable. Other: No free fluid or fluid collections. No signs of pneumoperitoneum. Musculoskeletal: Extensive postoperative changes identified involving the lumbar spine compatible with prior thoracolumbar fusion. Unchanged appearance of chronic compression deformity involving the L3 vertebra. Previous bilateral hip arthroplasties. There is extensive sclerosis identified along bilateral SI joints compatible with chronic sacroiliitis. IMPRESSION: 1. There is abnormal bowel wall edema and inflammation involving the distal transverse colon through the sigmoid colon concerning for acute colitis. No sign of pneumatosis or bowel perforation. 2. Mild dilatation of the common bile duct with possible filling defect within the distal CBD measuring 5 mm. Correlate for any clinical signs or symptoms of biliary obstruction. If there is a clinical concern for choledocholithiasis further evaluation with MRCP may be helpful. 3. Chronic bilateral sacroiliitis. 4. Unchanged subpleural nodule within the right lower lobe abutting the major fissure measuring 6 mm. This was present on examination from 12/16/2019 and most likely represents a benign nodule. 5. Extensive postoperative changes identified involving the lumbar spine compatible with prior thoracolumbar fusion. Unchanged appearance of chronic compression deformity involving the L3 vertebra. 6.  Aortic Atherosclerosis (ICD10-I70.0). Electronically Signed   By: Signa Kell M.D.   On: 10/06/2022 16:10    Procedures .Critical Care  Performed by: Shon Baton, MD Authorized by: Shon Baton, MD   Critical care provider statement:    Critical care time (minutes):  40   Critical care was necessary to treat or prevent  imminent or life-threatening deterioration of the following conditions:  Sepsis   Critical care was time spent personally by me on the following activities:  Development of treatment plan with patient or surrogate, discussions with consultants, evaluation of patient's response to treatment, examination of patient, ordering and review of laboratory studies, ordering and review of radiographic studies, ordering and performing treatments and interventions, pulse oximetry, re-evaluation of patient's condition and review of old charts     Medications Ordered in ED Medications  lactated ringers infusion (has no administration in time range)  cefTRIAXone (ROCEPHIN) 2 g in sodium chloride 0.9 % 100 mL IVPB (2 g Intravenous New Bag/Given 10/07/22 0339)  metroNIDAZOLE (FLAGYL) IVPB 500 mg (has no administration in  time range)  sodium chloride 0.9 % bolus 500 mL (has no administration in time range)  iohexol (OMNIPAQUE) 350 MG/ML injection 75 mL (75 mLs Intravenous Contrast Given 10/06/22 1546)  sodium chloride 0.9 % bolus 1,000 mL (0 mLs Intravenous Stopped 10/07/22 0307)  morphine (PF) 4 MG/ML injection 4 mg (4 mg Intravenous Given 10/07/22 0227)  ondansetron (ZOFRAN) injection 4 mg (4 mg Intravenous Given 10/07/22 0227)  sodium chloride 0.9 % bolus 1,000 mL (1,000 mLs Intravenous New Bag/Given 10/07/22 0304)    ED Course/ Medical Decision Making/ A&P Clinical Course as of 10/07/22 0340  Wed Oct 07, 2022  0318 Informed by nursing of persistently low blood pressures while bedded.  On my recheck it is 94/47.  Her initial blood pressure upon arrival was 110/51.  I have ordered an additional 1500 cc of fluid to make 40 cc/kg.  Lactate is normal.  Patient clinically appears dry although septic shock is not fully ruled out.  Given colitis on CT scan, will cover with Rocephin and Flagyl.  Blood cultures were added.  Repeat hemoglobin also ordered. [CH]    Clinical Course User Index [CH] Emili Mcloughlin, Mayer Maskerourtney F, MD                            Medical Decision Making Amount and/or Complexity of Data Reviewed Labs: ordered.  Risk Prescription drug management. Decision regarding hospitalization.   This patient presents to the ED for concern of abdominal pain, bloody stools, this involves an extensive number of treatment options, and is a complaint that carries with it a high risk of complications and morbidity.  I considered the following differential and admission for this acute, potentially life threatening condition.  The differential diagnosis includes diverticulitis, colitis, other lower GI bleed, upper GI bleed, cholecystitis  MDM:    This is a 71 year old female who presents with abdominal pain and bloody stools.  She is overall nontoxic and initial vital signs are reassuring.  After being placed in the room evaluating the patient.  She is having some abdominal discomfort.  She has a slight leukocytosis.  Otherwise no LFT abnormalities.  CT scan obtained from triage provider shows evidence of colitis.  There is questionable CBD duct dilation.  Patient does not have any right upper quadrant tenderness at this time.  Clinically, patient appears dry.  Creatinine is slightly elevated at 1.5.  She was given pain, nausea medication, and fluids.  I was advised of persistently marginal blood pressures 80s to 90 systolic.  I have added additional lab work including lactate to screen for sepsis.  She is at risk for C. difficile colitis given recent antibiotic usage; however, ischemic colitis and inflammatory colitis are also potential culprits.  After 1500 cc of fluid, patient with persistently low blood pressures.  She is mentating well.  Lactate is normal.  Will cover with Flagyl and Rocephin for possible sepsis with intra-abdominal pathology.  She has been unable to provide a stool sample at this time.  Repeat hemoglobin has also been ordered although she has not had any ongoing bleeding here in the emergency department.   We will plan for admission to the hospitalist.  (Labs, imaging, consults)  Labs: I Ordered, and personally interpreted labs.  The pertinent results include: CBC, CMP, lipase, blood cultures  Imaging Studies ordered: I ordered imaging studies including CT abdomen and pelvis I independently visualized and interpreted imaging. I agree with the radiologist interpretation  Additional history obtained  from chart review.  External records from outside source obtained and reviewed including outside notes, prior evaluations  Cardiac Monitoring: The patient was maintained on a cardiac monitor.  I personally viewed and interpreted the cardiac monitored which showed an underlying rhythm of: Sinus rhythm  Reevaluation: After the interventions noted above, I reevaluated the patient and found that they have :improved  Social Determinants of Health:  lives independently  Disposition: Admit  Co morbidities that complicate the patient evaluation  Past Medical History:  Diagnosis Date   Arthritis    CKD (chronic kidney disease)    Gallstones 10/2019   Hypercholesteremia    Hypertension    Loculated pleural effusion 12/2019   right     Medicines Meds ordered this encounter  Medications   iohexol (OMNIPAQUE) 350 MG/ML injection 75 mL   sodium chloride 0.9 % bolus 1,000 mL   morphine (PF) 4 MG/ML injection 4 mg   ondansetron (ZOFRAN) injection 4 mg   sodium chloride 0.9 % bolus 1,000 mL   lactated ringers infusion   cefTRIAXone (ROCEPHIN) 2 g in sodium chloride 0.9 % 100 mL IVPB    Order Specific Question:   Antibiotic Indication:    Answer:   Intra-abdominal   metroNIDAZOLE (FLAGYL) IVPB 500 mg    Order Specific Question:   Antibiotic Indication:    Answer:   Intra-abdominal Infection   sodium chloride 0.9 % bolus 500 mL    I have reviewed the patients home medicines and have made adjustments as needed  Problem List / ED Course: Problem List Items Addressed This Visit    None Visit Diagnoses     Colitis    -  Primary   Rectal bleeding       AKI (acute kidney injury) (HCC)                       Final Clinical Impression(s) / ED Diagnoses Final diagnoses:  Colitis  Rectal bleeding  AKI (acute kidney injury) (HCC)    Rx / DC Orders ED Discharge Orders     None         Shon Baton, MD 10/07/22 0340

## 2022-10-07 NOTE — ED Notes (Signed)
RN called lab about H&H that was sent down at 1030, lab states they will put it in process now

## 2022-10-07 NOTE — Sepsis Progress Note (Signed)
Following per sepsis protocol   

## 2022-10-07 NOTE — ED Notes (Signed)
Pt is non compliant with urine and stool sample. RN educated pt on importance of sample

## 2022-10-07 NOTE — H&P (Signed)
History and Physical    PLEASE NOTE THAT DRAGON DICTATION SOFTWARE WAS USED IN THE CONSTRUCTION OF THIS NOTE.   Suzanne Hagadorn JSE:[id number] DOB: Dec 13, 1950 DOA: 10/06/2022  PCP: Kelton Pillar, MD  Patient coming from: home   I have personally briefly reviewed patient's old medical records in [place]  Chief Complaint: Abdominal pain  HPI: Suzanne Harrison is a 71 y.o. female with medical history significant for essential hypertension, hyperlipidemia, acquired hypothyroidism, anemia of chronic disease, associated baseline hemoglobin 9-12, who is admitted to [facility] on 10/06/2022 with severe sepsis due to colitis after presenting from home to [facility] ED complaining of abdominal pain.   The patient reports 2 days of new onset bilateral lower abdominal quadrant discomfort, that has been sharp in nature, nonradiating, and worse with palpation of the abdomen.  This been associated with new onset loose stool, at least 3-4 episodes such, associated with some hematochezia in the absence of melena.  She also notes associated subjective fever in the absence of chills, full body rigors, or generalized myalgias.  She denies any associated chest pain, shortness of breath, palpitations, diaphoresis, dizziness, presyncope, or syncope.  No rash, dysuria, gross hematuria, cough, neck stiffness.  Denies any upper abdominal discomfort.  Not on any blood thinners as an outpatient.  No history of liver disease or alcohol abuse.  No recent trauma or travel.    ED Course:  Vital signs in the ED were notable for the following: Afebrile; heart rate 07-37; systolic blood pressures initially in the 90s, with subsequent improvement into the low 100s following interval IV fluids, as further quantified below; respiratory rate 15-21, oxygen saturation 95 to 98% on room air.  Labs were notable for the following: CMP notable for sodium 130 compared to most recent prior value 140 in January 2021,  bicarbonate 16 with anion gap 11, creatinine 1.53 compared to most recent prior value of 1.08 in January 2021, liver enzymes within normal limits.  Lipase 28.  Lactic acid 1.1.  CBC notable for white blood cell count 13,600 with 85% neutrophils, hemoglobin 13.9 associated normocytic/normochromic properties as well as nonelevated RDW.  INR 1.1.  Type and screen ordered.  Urinalysis ordered with result currently pending.  C. difficile/GI panel PCR ordered, results currently pending.  Blood cultures x2 collected prior to initiation of IV antibiotics.  Imaging and additional notable ED work-up: CT abdomen/pelvis with contrast showed abnormal bowel wall edema and inflammation involving the distal transverse colon the sigmoid colon concerning for acute colitis in the absence of any evidence of abscess or bowel perforation.  There is also evidence of mild dilation of the common bile duct with distal common bile duct measuring 5 mm in the absence of any evidence of choledocholithiasis.  While in the ED, the following were administered: Morphine 4 mg IV x1, Zofran 4 mg IV x1, Rocephin, Flagyl, normal saline x2.5 L bolus.  Subsequently, the patient was admitted the patient was admitted for further evaluation management of severe sepsis due to colitis, complicated by acute lower GI bleed.     Review of Systems: As per HPI otherwise 10 point review of systems negative.   Past Medical History:  Diagnosis Date   Arthritis    CKD (chronic kidney disease)    Gallstones 10/2019   Hypercholesteremia    Hypertension    Loculated pleural effusion 12/2019   right    Past Surgical History:  Procedure Laterality Date   BACK SURGERY  BILIARY DRAINAGE  10/03/2019   ATTEMPTED LAPAROSCOPIC CHOLECYSTECTOMY WITH PLACEMENT OF DRAIN (N/A Abdomen)   CHOLECYSTECTOMY N/A 10/03/2019   Procedure: ATTEMPTED LAPAROSCOPIC CHOLECYSTECTOMY WITH PLACEMENT OF DRAIN;  Surgeon: Rolm Bookbinder, MD;  [facility];  Service:  General;  Laterality: N/A;   IR RADIOLOGIST EVAL & MGMT  11/16/2019   IR RADIOLOGIST EVAL & MGMT  12/13/2019   IR THORACENTESIS ASP PLEURAL SPACE W/IMG GUIDE  12/18/2019   REPLACEMENT TOTAL HIP W/  RESURFACING IMPLANTS      Social History:  reports that she has never smoked. She has never used smokeless tobacco. She reports that she does not drink alcohol and does not use drugs.   Allergies  Allergen Reactions   Sulfamethazine Other (See Comments)   Gatifloxacin Other (See Comments) and Rash    Other reaction(s): Myalgias (Muscle Pain)  Rash in mouth   Sulfa Antibiotics Nausea Only    Family History  Problem Relation Age of Onset   Stroke Mother    COPD Mother    Heart disease Father    Sleep apnea Brother     Family history reviewed and not pertinent    Prior to Admission medications   Medication Sig Start Date End Date Taking? Authorizing Provider  aspirin EC 81 MG tablet Take 81 mg by mouth at bedtime.    [provider]  atenolol (TENORMIN) 50 MG tablet Take 50 mg by mouth daily. 09/18/19   [provider]  Calcium Carbonate-Vitamin D (CALCIUM-D PO) Take 1-2 tablets by mouth See admin instructions. Take 2 tablets by mouth every morning and 1 tablet at night    [provider]  cetirizine (ZYRTEC) 10 MG tablet Take 10 mg by mouth daily.    [provider]  citalopram (CELEXA) 20 MG tablet Take 20 mg by mouth daily. 02/23/18   [provider]  DENTA 5000 PLUS 1.1 % CREA dental cream Place 1 application onto teeth 2 (two) times daily. Do not eat, drink or rinse for 30 minutes after use. 08/12/19   [provider]  guaiFENesin-dextromethorphan (ROBITUSSIN DM) 100-10 MG/5ML syrup Take 5 mLs by mouth every 6 (six) hours as needed for cough. 12/25/19   Arrien, Jimmy Picket, MD  ipratropium-albuterol (DUONEB) 0.5-2.5 (3) MG/3ML SOLN Take 3 mLs by nebulization every 6 (six) hours as needed for up to 15 days. 12/25/19 01/09/20   Arrien, Jimmy Picket, MD  levothyroxine (SYNTHROID, LEVOTHROID) 50 MCG tablet Take 50 mcg by mouth daily. 02/15/18   [provider]  Lifitegrast Shirley Friar) 5 % SOLN Place 1 drop into both eyes 2 (two) times daily.    [provider]  lisinopril (PRINIVIL,ZESTRIL) 5 MG tablet Take 5 mg by mouth daily. 02/09/18   [provider]  Multiple Vitamin (MULTIVITAMIN WITH MINERALS) TABS tablet Take 1 tablet by mouth daily.     [provider]  Multiple Vitamins-Minerals (HAIR/SKIN/NAILS/BIOTIN) TABS Take 1 tablet by mouth daily.     [provider]  oxybutynin (DITROPAN-XL) 10 MG 24 hr tablet Take 10 mg by mouth daily. 02/13/18   [provider]  pantoprazole (PROTONIX) 40 MG tablet Take 40 mg by mouth 2 (two) times daily. 02/11/18   [provider]  polyvinyl alcohol (ARTIFICIAL TEARS) 1.4 % ophthalmic solution Place 1 drop into both eyes 5 (five) times daily.    [provider]  pregabalin (LYRICA) 150 MG capsule Take 150 mg by mouth 2 (two) times daily.     [provider]  rosuvastatin (CRESTOR) 10 MG tablet Take 10 mg by mouth at bedtime.  02/15/18   [provider]     Objective    Physical Exam: Vitals:   10/07/22 0245 10/07/22 0300 10/07/22 0305 10/07/22 0343  BP: (!) 94/47 (!) 88/46 (!) 91/45   Pulse: 60 61 64   Resp: _0 Temp:    97.9 F (36.6 C)  TempSrc:    Oral  SpO2: 95% 98% 98%   Weight:      Height:        General: appears to be stated age; alert, oriented Skin: warm, dry, no rash Head:  AT/[place] Mouth:  Oral mucosa membranes appear dry, normal dentition Neck: supple; trachea midline Heart:  RRR; did not appreciate any M/R/G Lungs: CTAB, did not appreciate any wheezes, rales, or rhonchi Abdomen: + BS; soft, ND, tender to palpation over the bilateral lower abdominal quadrants in the absence of any associated guarding, rigidity, or rebound tenderness Vascular: 2+ pedal pulses b/l; 2+  radial pulses b/l Extremities: no peripheral edema, no muscle wasting Neuro: strength and sensation intact in upper and lower extremities b/l      Labs on Admission: I have personally reviewed following labs and imaging studies  CBC: Recent Labs  Lab 10/06/22 1250 10/07/22 0325  WBC 13.6*  --   NEUTROABS 11.5*  --   HGB 13.9 10.7*  HCT 42.1 32.6*  MCV 83.7  --   PLT 196  --    Basic Metabolic Panel: Recent Labs  Lab 10/06/22 1250  NA 130*  K 4.8  CL 100  CO2 19*  GLUCOSE 160*  BUN 18  CREATININE 1.53*  CALCIUM 9.1   GFR: Estimated Creatinine Clearance: 33.3 mL/min (A) (by C-G formula based on SCr of 1.53 mg/dL (H)). Liver Function Tests: Recent Labs  Lab 10/06/22 1250  AST 19  ALT 11  ALKPHOS 72  BILITOT 0.8  PROT 6.3*  ALBUMIN 3.3*   Recent Labs  Lab 10/06/22 1250  LIPASE 28   No results for input(s): "AMMONIA" in the last 168 hours. Coagulation Profile: Recent Labs  Lab 10/06/22 1250  INR 1.1   Cardiac Enzymes: No results for input(s): "CKTOTAL", "CKMB", "CKMBINDEX", "TROPONINI" in the last 168 hours. BNP (last 3 results) No results for input(s): "PROBNP" in the last 8760 hours. HbA1C: No results for input(s): "HGBA1C" in the last 72 hours. CBG: No results for input(s): "GLUCAP" in the last 168 hours. Lipid Profile: No results for input(s): "CHOL", "HDL", "LDLCALC", "TRIG", "CHOLHDL", "LDLDIRECT" in the last 72 hours. Thyroid Function Tests: No results for input(s): "TSH", "T4TOTAL", "FREET4", "T3FREE", "THYROIDAB" in the last 72 hours. Anemia Panel: No results for input(s): "VITAMINB12", "FOLATE", "FERRITIN", "TIBC", "IRON", "RETICCTPCT" in the last 72 hours. Urine analysis:    Component Value Date/Time   COLORURINE YELLOW 12/16/2019 1625   APPEARANCEUR CLEAR 12/16/2019 1625   LABSPEC 1.012 12/16/2019 1625   PHURINE 5.0 12/16/2019 1625   GLUCOSEU NEGATIVE 12/16/2019 1625   HGBUR NEGATIVE 12/16/2019 1625   BILIRUBINUR NEGATIVE  12/16/2019 1625   KETONESUR NEGATIVE 12/16/2019 1625   PROTEINUR NEGATIVE 12/16/2019 1625   NITRITE NEGATIVE 12/16/2019 1625   LEUKOCYTESUR NEGATIVE 12/16/2019 1625    Radiological Exams on Admission: CT Abdomen Pelvis W Contrast  Result Date: 10/06/2022 CLINICAL DATA:  Abdominal pain, acute. EXAM: CT ABDOMEN AND PELVIS WITH CONTRAST TECHNIQUE: Multidetector CT imaging of the abdomen and pelvis was performed using the standard protocol following bolus administration of intravenous contrast.  RADIATION DOSE REDUCTION: This exam was performed according to the departmental dose-optimization program which includes automated exposure control, adjustment of the mA and/or kV according to patient size and/or use of iterative reconstruction technique. CONTRAST:  21m OMNIPAQUE IOHEXOL 350 MG/ML SOLN COMPARISON:  12/16/2019 FINDINGS: Lower chest: No acute findings. Unchanged subpleural nodule within the right lower lobe abutting the major fissure measuring 6 mm. This was present on examination from 12/16/2019 and most likely represents a benign nodule. Hepatobiliary: No focal liver abnormality identified. Gallbladder appears unremarkable. The gallbladder appears unremarkable. A patent common bile duct measures up to 0.9 cm and there is possible filling defect within the distal CBD measuring 5 mm, image 38/3. Pancreas: No pancreatic inflammation, main duct dilatation or mass. Spleen: Normal in size without focal abnormality. Adrenals/Urinary Tract: Normal adrenal glands. Bilateral kidney cysts are identified. The largest arises off the inferior pole of the left kidney measuring 8.4 by 5.1 cm. There also scattered too small to reliably characterize renal hypodensities. No follow-up imaging recommended. No signs of nephrolithiasis or hydronephrosis. Urinary bladder is unremarkable. Stomach/Bowel: Stomach appears within normal limits. The appendix is not visualized and may be surgically absent. There is abnormal bowel  wall edema and inflammation involving the distal transverse colon through the sigmoid colon concerning for acute colitis. There is no sign of pneumatosis or bowel perforation. No evidence for bowel obstruction. Vascular/Lymphatic: Aortic atherosclerotic calcifications. No signs of abdominopelvic adenopathy. Reproductive: Uterus and bilateral adnexa are unremarkable. Other: No free fluid or fluid collections. No signs of pneumoperitoneum. Musculoskeletal: Extensive postoperative changes identified involving the lumbar spine compatible with prior thoracolumbar fusion. Unchanged appearance of chronic compression deformity involving the L3 vertebra. Previous bilateral hip arthroplasties. There is extensive sclerosis identified along bilateral SI joints compatible with chronic sacroiliitis. IMPRESSION: 1. There is abnormal bowel wall edema and inflammation involving the distal transverse colon through the sigmoid colon concerning for acute colitis. No sign of pneumatosis or bowel perforation. 2. Mild dilatation of the common bile duct with possible filling defect within the distal CBD measuring 5 mm. Correlate for any clinical signs or symptoms of biliary obstruction. If there is a clinical concern for choledocholithiasis further evaluation with MRCP may be helpful. 3. Chronic bilateral sacroiliitis. 4. Unchanged subpleural nodule within the right lower lobe abutting the major fissure measuring 6 mm. This was present on examination from 12/16/2019 and most likely represents a benign nodule. 5. Extensive postoperative changes identified involving the lumbar spine compatible with prior thoracolumbar fusion. Unchanged appearance of chronic compression deformity involving the L3 vertebra. 6.  Aortic Atherosclerosis (ICD10-I70.0). Electronically Signed   By: TKerby MoorsM.D.   On: 10/06/2022 16:10        Assessment/Plan   Principal Problem:   Colitis Active Problems:   HLD (hyperlipidemia)   Acquired  hypothyroidism   Severe sepsis (HCC)   AKI (acute kidney injury) ([facility]   Acute hyponatremia   Acute lower GI bleeding   History of essential hypertension   Anemia of chronic disease      #) Severe sepsis due to colitis: In the setting of 2 days of new onset lower abdominal discomfort associated with new onset loose stool with CT abdomen/pelvis suggestive of colitis in the absence of evidence of bowel obstruction or abscess, as further detailed above.  SIRS criteria met via presenting leukocytosis as well as tachypnea.  Initial lactate nonelevated at 1.1.  However, in the setting of evidence of endorgan damage, as evidenced by acute kidney injury, criteria are met  for patient's sepsis to be considered severe in nature.  Of note, she has received greater than 30 mL/kg IVF bolus administered in the emergency department this evening.  Blood cultures x2 collected prior to initiation of Rocephin and Flagyl, which will be continued for now, pending evaluation of stool studies.  No evidence of additional underlying factious process at this time, although urinalysis result is currently pending.  We will also pursue chest x-ray to further evaluate any additional sources of underlying infection.   Plan: Continuous IV fluids.  Monitor for results of blood cultures x2.  Continue Rocephin and Flagyl.  Check urinalysis.  Check chest x-ray.  Repeat CBC with differential in the morning.  CMP in the morning.  Prn IV Zofran.          #) Acute lower GI of bleed: Patient's diarrhea over the last 1 to 2 days has been associated with some hematochezia.  Suspect that that this is on the basis of her infectious colitis.  We will closely monitor ensuing hemoglobin trend, while focusing treatment/evaluation of her suspected infectious colitis, without distribution or history that would be consistent with ischemic colitis.  No known history of inflammatory bowel disease.    Plan: Further evaluation management of  presenting infectious colitis, as above.  Repeat C BC in the morning.  Type and screen ordered.  Q for H&H checking through 9 AM.  Monitor oximetry.  Refraining from pharmacologic DVT prophylaxis.  SCDs.          #) Anemia of chronic disease: Documented history of such, associated baseline hemoglobin 9-12, with presenting hemoglobin consistent with this range.  There is evidence of recent bleed associated with the patient's presenting colitis, as above.  No additional source of bleeding identified at this time.  Plan: Every 4 hour H&H's ordered through 9 AM this morning.  Repeat CBC in the morning.  Carley Hammed pharmacologic DVT prophylaxis.              #) Acute Kidney Injury:  as quantified above.  Appears to be prerenal in nature, with multifactorial contributions towards this being from increase in GI losses in the form recent diarrhea with diminished oral intake over that timeframe, as well as additional contribution towards intravascular depletion as consequence of presenting severe sepsis itself in the setting of presenting infectious colitis.  Potential pharmacologic exacerbating contributions in the form of home lisinopril.   Plan: monitor strict I's & O's and daily weights. Attempt to avoid nephrotoxic agents.  Hold lisinopril, oxybutynin, and Lyrica.  Refrain from NSAIDs. Repeat CMP in the morning. Check serum magnesium level.  Urinalysis with microscopy.  Add-on random urine sodium and random urine creatinine.  IV fluids, as above.  Further evaluation management of severe sepsis due to suspected infectious colitis, as further detailed above.          #) Acute hypoosmolar hyponatremia: Presenting serum sodium 130 compared to most recent prior value 140 in January 2021.  Suspect contribution from extrarenal losses in the form of gastrointestinal losses as evidenced by presenting loose stool in the setting of factious colitis.  This appears to be hypovolemic in nature as a  consequence of this.  Also check TSH level given reported history of acquired hypothyroidism, and further assess for any contributions from underlying SIADH with urine studies as further described below.  Plan: IV fluids, as above.  Further evaluation management suspected infectious colitis, as above.  Add on TSH.  Check random urine sodium as well as random  urine creatinine.  Urinalysis.  Check urine osmolality as well as serum osmolality.  Add on TSH.  Monitor strict I's and O's and daily weights.  Repeat CMP in the morning.           #) Hyperlipidemia: Documented history of such, on rosuvastatin as an outpatient.    Plan: Continue statin.          #) Essential hypertension: Documented history of such, on lisinopril and atenolol as an outpatient.  However, in the setting of presenting severe sepsis, will hold home antihypertensive medications for now.  Plan: Hold home hypertensive medications for now.              #) acquired hypothyroidism: documented h/o such, on Synthroid as outpatient.   Plan: cont home Synthroid.  Check TSH.     DVT prophylaxis: SCD's   Code Status: Full code Family Communication: none Disposition Plan: Per Rounding Team Consults called: none;  Admission status: Inpatient    PLEASE NOTE THAT DRAGON DICTATION SOFTWARE WAS USED IN THE CONSTRUCTION OF THIS NOTE.   [place] DO [facility]  From [place]   10/07/2022, 3:58 AM

## 2022-10-07 NOTE — Progress Notes (Signed)
Patient admitted to the hospital earlier this morning by Dr. Arlean Hopping  Patient seen and examined. She describes continued abdominal discomfort, mostly in lower abdomen and LLQ.  She says that she has passed some blood through her stool since being in the emergency room.  Unfortunately, sample has not been sent off as of yet.  She is diffusely tender in her abdomen.  She has not had any nausea or vomiting.  Assessment/plan  Severe sepsis due to colitis Hematochezia -CT abdomen confirms colitis in the distal transverse colon through the sigmoid colon -Stool studies have been ordered including GI pathogen panel as well as C. difficile, but has not been sent as of yet -She has been started empirically on ceftriaxone and Flagyl -Continue empiric antibiotics for now -Blood cultures have been sent  Acute kidney injury -Suspect this may be related to hypotension on admission -Continue with IV fluids -Renal ultrasound -Hold further lisinopril -She reports that she is making urine  Acute blood loss anemia -Related to hematochezia from colitis -Continue to follow serial hemoglobin  Hyponatremia -Suspect is related to volume depletion -Improving with IV fluids  Hyperlipidemia -Continue statin  Hypertension -Holding lisinopril and atenolol in light of low blood pressure on admission  Chronic pain syndrome -Continue home dose of Percocet as well as Lyrica  Hypothyroidism -Continue Synthroid  [employer]

## 2022-10-07 NOTE — ED Notes (Signed)
Attempted to collect urine and stool sample from pt. Pt states she is unable to give sample at this time.

## 2022-10-07 NOTE — ED Notes (Signed)
Patient placed in recliner for comfort 

## 2022-10-08 DIAGNOSIS — K529 Noninfective gastroenteritis and colitis, unspecified: Secondary | ICD-10-CM | POA: Diagnosis not present

## 2022-10-08 LAB — COMPREHENSIVE METABOLIC PANEL
ALT: 8 U/L (ref 0–44)
AST: 13 U/L — ABNORMAL LOW (ref 15–41)
Albumin: 2.3 g/dL — ABNORMAL LOW (ref 3.5–5.0)
Alkaline Phosphatase: 54 U/L (ref 38–126)
Anion gap: 6 (ref 5–15)
BUN: 14 mg/dL (ref 8–23)
CO2: 23 mmol/L (ref 22–32)
Calcium: 7.9 mg/dL — ABNORMAL LOW (ref 8.9–10.3)
Chloride: 105 mmol/L (ref 98–111)
Creatinine, Ser: 1.2 mg/dL — ABNORMAL HIGH (ref 0.44–1.00)
GFR, Estimated: 49 mL/min — ABNORMAL LOW (ref >60)
Glucose, Bld: 96 mg/dL (ref 70–99)
Potassium: 4 mmol/L (ref 3.5–5.1)
Sodium: 134 mmol/L — ABNORMAL LOW (ref 135–145)
Total Bilirubin: 0.4 mg/dL (ref 0.3–1.2)
Total Protein: 4.8 g/dL — ABNORMAL LOW (ref 6.5–8.1)

## 2022-10-08 LAB — IRON AND TIBC
Iron: 27 ug/dL — ABNORMAL LOW (ref 28–170)
Saturation Ratios: 15 % (ref 10.4–31.8)
TIBC: 182 ug/dL — ABNORMAL LOW (ref 250–450)
UIBC: 155 ug/dL

## 2022-10-08 LAB — CBC
HCT: 29.3 % — ABNORMAL LOW (ref 36.0–46.0)
Hemoglobin: 9.6 g/dL — ABNORMAL LOW (ref 12.0–15.0)
MCH: 28.7 pg (ref 26.0–34.0)
MCHC: 32.8 g/dL (ref 30.0–36.0)
MCV: 87.5 fL (ref 80.0–100.0)
Platelets: 100 10*3/uL — ABNORMAL LOW (ref 150–400)
RBC: 3.35 MIL/uL — ABNORMAL LOW (ref 3.87–5.11)
RDW: 14.7 % (ref 11.5–15.5)
WBC: 9 10*3/uL (ref 4.0–10.5)
nRBC: 0 % (ref 0.0–0.2)

## 2022-10-08 LAB — C DIFFICILE QUICK SCREEN W PCR REFLEX
C Diff antigen: NEGATIVE
C Diff interpretation: NOT DETECTED
C Diff toxin: NEGATIVE

## 2022-10-08 LAB — OCCULT BLOOD X 1 CARD TO LAB, STOOL: Fecal Occult Bld: POSITIVE — AB

## 2022-10-08 LAB — FERRITIN: Ferritin: 120 ng/mL (ref 11–307)

## 2022-10-08 NOTE — Consult Note (Addendum)
Referring Provider: Dr. Sharl Ma Primary Care Physician:  Maurice Small, MD Primary Gastroenterologist:  Dr. Dulce Sellar  Reason for Consultation:  Diarrhea; Rectal bleeding; Abdominal pain  HPI: Suzanne Harrison is a 71 y.o. female who had the acute onset of bloody diarrhea and crampy diffuse abdominal pain that started Monday. Bloody diarrhea and abdominal pain continued during the week and came in 10/06/22 for evaluation. Denies N/V/dizziness. CT shows colonic wall inflammation from distal transverse colon through the sigmoid colon. Question of possible filling defect in distal CBD. Last colon in March 2023 (Dr. Dulce Sellar) were 2 adenomas were removed and showed sigmoid diverticulosis.   Past Medical History:  Diagnosis Date   Arthritis    CKD (chronic kidney disease)    Gallstones 10/2019   Hypercholesteremia    Hypertension    Loculated pleural effusion 12/2019   right    Past Surgical History:  Procedure Laterality Date   BACK SURGERY     BILIARY DRAINAGE  10/03/2019   ATTEMPTED LAPAROSCOPIC CHOLECYSTECTOMY WITH PLACEMENT OF DRAIN (N/A Abdomen)   CHOLECYSTECTOMY N/A 10/03/2019   Procedure: ATTEMPTED LAPAROSCOPIC CHOLECYSTECTOMY WITH PLACEMENT OF DRAIN;  Surgeon: Emelia Loron, MD;  Location: [facility] OR;  Service: General;  Laterality: N/A;   IR RADIOLOGIST EVAL & MGMT  11/16/2019   IR RADIOLOGIST EVAL & MGMT  12/13/2019   IR THORACENTESIS ASP PLEURAL SPACE W/IMG GUIDE  12/18/2019   REPLACEMENT TOTAL HIP W/  RESURFACING IMPLANTS      Prior to Admission medications   Medication Sig Start Date End Date Taking? Authorizing Provider  aspirin EC 81 MG tablet Take 81 mg by mouth at bedtime.   Yes [provider]  atenolol (TENORMIN) 50 MG tablet Take 50 mg by mouth daily. 09/18/19  Yes [provider]  Calcium Carbonate-Vitamin D (CALCIUM-D PO) Take 1 tablet by mouth 2 (two) times daily.   Yes [provider]  cephALEXin (KEFLEX) 500 MG capsule Take 500 mg by mouth  2 (two) times daily. 10/01/22  Yes [provider]  cetirizine (ZYRTEC) 10 MG tablet Take 10 mg by mouth daily.   Yes [provider]  citalopram (CELEXA) 40 MG tablet Take 40 mg by mouth daily. 02/23/18  Yes [provider]  DENTA 5000 PLUS 1.1 % CREA dental cream Place 1 application onto teeth 2 (two) times daily. Do not eat, drink or rinse for 30 minutes after use. 08/12/19  Yes [provider]  diclofenac Sodium (VOLTAREN) 1 % GEL Apply 2 g topically 4 (four) times daily as needed (pain). 08/13/22  Yes [provider]  furosemide (LASIX) 20 MG tablet 20 mg daily as needed for fluid.   Yes [provider]  ipratropium-albuterol (DUONEB) 0.5-2.5 (3) MG/3ML SOLN Inhale 3 mLs into the lungs every 6 (six) hours as needed (shortness of breath). 12/25/19  Yes [provider]  levothyroxine (SYNTHROID, LEVOTHROID) 50 MCG tablet Take 50 mcg by mouth at bedtime. 02/15/18  Yes [provider]  lisinopril (PRINIVIL,ZESTRIL) 5 MG tablet Take 5 mg by mouth daily. 02/09/18  Yes [provider]  mirabegron ER (MYRBETRIQ) 50 MG TB24 tablet Take 50 mg by mouth daily.   Yes [provider]  Multiple Vitamin (MULTIVITAMIN WITH MINERALS) TABS tablet Take 1 tablet by mouth daily.    Yes [provider]  Multiple Vitamins-Minerals (HAIR/SKIN/NAILS/BIOTIN) TABS Take 1 tablet by mouth daily.    Yes [provider]  oxyCODONE-acetaminophen (PERCOCET) 10-325 MG tablet Take 1 tablet by mouth every 6 (six)  hours as needed for pain.   Yes [provider]  pantoprazole (PROTONIX) 40 MG tablet Take 40 mg by mouth 2 (two) times daily. 02/11/18  Yes [provider]  polyvinyl alcohol (ARTIFICIAL TEARS) 1.4 % ophthalmic solution Place 1 drop into both eyes 2 (two) times daily as needed for dry eyes.   Yes [provider]  pregabalin (LYRICA) 150 MG capsule Take 150 mg by mouth 2 (two) times daily.    Yes  [provider]  rosuvastatin (CRESTOR) 10 MG tablet Take 10 mg by mouth at bedtime.  02/15/18  Yes [provider]  tolterodine (DETROL LA) 2 MG 24 hr capsule Take 2 mg by mouth daily.   Yes [provider]    Scheduled Meds:  citalopram  40 mg Oral Daily   levothyroxine  50 mcg Oral Daily   mirabegron ER  50 mg Oral Daily   pantoprazole  40 mg Oral BID   pregabalin  150 mg Oral BID   rosuvastatin  10 mg Oral QHS   Continuous Infusions:  cefTRIAXone (ROCEPHIN)  IV Stopped (10/08/22 0320)   lactated ringers 125 mL/hr at 10/08/22 0659   metronidazole 500 mg (10/08/22 1123)   PRN Meds:.acetaminophen **OR** acetaminophen, HYDROmorphone (DILAUDID) injection, ipratropium-albuterol, ondansetron (ZOFRAN) IV, oxyCODONE-acetaminophen **AND** oxyCODONE  Allergies as of 10/06/2022 - Review Complete 10/06/2022  Allergen Reaction Noted   Sulfamethazine Other (See Comments) 10/06/2022   Gatifloxacin Other (See Comments) and Rash 03/02/2012   Sulfa antibiotics Nausea Only 09/10/2017    Family History  Problem Relation Age of Onset   Stroke Mother    COPD Mother    Heart disease Father    Sleep apnea Brother     Social History   Socioeconomic History   Marital status: Married    Spouse name: Not on file   Number of children: Not on file   Years of education: Not on file   Highest education level: Not on file  Occupational History   Not on file  Tobacco Use   Smoking status: Never   Smokeless tobacco: Never  Vaping Use   Vaping Use: Never used  Substance and Sexual Activity   Alcohol use: No   Drug use: No   Sexual activity: Not on file  Other Topics Concern   Not on file  Social History Narrative   Not on file   Social Determinants of Health   Financial Resource Strain: Not on file  Food Insecurity: Not on file  Transportation Needs: Not on file  Physical Activity: Not on file  Stress: Not on file  Social Connections: Not on file  Intimate  Partner Violence: Not on file    Review of Systems: All negative except as stated above in HPI.  Physical Exam: Vital signs: Vitals:   10/08/22 0633 10/08/22 0809  BP: 128/63 (!) 122/56  Pulse:  82  Resp:  (!) 23  Temp: 98.4 F (36.9 C) 98.1 F (36.7 C)  SpO2:  96%   Last BM Date : 10/07/22 General:   Lethargic, elderly, well-nourished, no acute distress  Head: normocephalic, atraumatic Eyes: anicteric sclera ENT: oropharynx clear Neck: supple, nontender Lungs:  Clear throughout to auscultation.   No wheezes, crackles, or rhonchi. No acute distress. Heart:  Regular rate and rhythm; no murmurs, clicks, rubs,  or gallops. Abdomen: LUQ tenderness with guarding, minimal tenderness in epigastric and RUQ, soft, nondistended, +BS, obese  Rectal:  Deferred Ext: no edema  GI:  Lab Results: Recent Labs  10/07/22 0440 10/07/22 1018 10/07/22 1813 10/08/22 0319  WBC 9.7  --  10.4 9.0  HGB 10.1* 10.8* 9.6* 9.6*  HCT 31.0* 32.9* 30.0* 29.3*  PLT 123*  --  134* 100*   BMET Recent Labs    10/06/22 1250 10/07/22 0440 10/08/22 0319  NA 130* 133* 134*  K 4.8 4.5 4.0  CL 100 107 105  CO2 19* 21* 23  GLUCOSE 160* 107* 96  BUN 18 23 14   CREATININE 1.53* 1.81* 1.20*  CALCIUM 9.1 7.8* 7.9*   LFT Recent Labs    10/08/22 0319  PROT 4.8*  ALBUMIN 2.3*  AST 13*  ALT 8  ALKPHOS 54  BILITOT 0.4   PT/INR Recent Labs    10/06/22 1250  LABPROT 13.8  INR 1.1     Studies/Results: 13/07/23 RENAL  Result Date: 10/07/2022 CLINICAL DATA:  Renal dysfunction EXAM: RENAL / URINARY TRACT ULTRASOUND COMPLETE COMPARISON:  CT done on 10/06/2022 FINDINGS: Right Kidney: Renal measurements: 10.3 x 5.2 x 5.5 cm = volume: 153.8 mL. There is no hydronephrosis. There is slightly increased echogenicity in the renal cortex. There is 6 x 5.5 x 5.7 cm cyst in the upper pole. Left Kidney: Renal measurements: 12 x 5.4 x 5.3 cm = volume: 181.6 mL. There is no hydronephrosis. There is slightly  increased cortical echogenicity. There is a large cyst measuring 8.6 x 6.8 x 4.8 cm in size in the lower pole. Bladder: Appears normal for degree of bladder distention. Other: None. IMPRESSION: There is no hydronephrosis. Bilateral renal cysts. Subtle increase in echogenicity and renal cortex may be a normal variation or suggest medical renal disease. Electronically Signed   By: 13/05/2022 M.D.   On: 10/07/2022 14:47   DG Chest Port 1 View  Result Date: 10/07/2022 CLINICAL DATA:  Rectal bleeding.  Sepsis. EXAM: PORTABLE CHEST 1 VIEW COMPARISON:  12/03/2021 FINDINGS: 0641 hours. The cardio pericardial silhouette is enlarged. There is pulmonary vascular congestion without overt pulmonary edema. The lungs are clear without focal pneumonia, edema, pneumothorax or pleural effusion. Extensive thoracic fusion hardware evident, incompletely visualized. Bones are diffusely demineralized. Telemetry leads overlie the chest. IMPRESSION: Enlargement of the cardiopericardial silhouette with pulmonary vascular congestion. Electronically Signed   By: 01/31/2022 M.D.   On: 10/07/2022 06:52   CT Abdomen Pelvis W Contrast  Result Date: 10/06/2022 CLINICAL DATA:  Abdominal pain, acute. EXAM: CT ABDOMEN AND PELVIS WITH CONTRAST TECHNIQUE: Multidetector CT imaging of the abdomen and pelvis was performed using the standard protocol following bolus administration of intravenous contrast. RADIATION DOSE REDUCTION: This exam was performed according to the departmental dose-optimization program which includes automated exposure control, adjustment of the mA and/or kV according to patient size and/or use of iterative reconstruction technique. CONTRAST:  70mL OMNIPAQUE IOHEXOL 350 MG/ML SOLN COMPARISON:  12/16/2019 FINDINGS: Lower chest: No acute findings. Unchanged subpleural nodule within the right lower lobe abutting the major fissure measuring 6 mm. This was present on examination from 12/16/2019 and most likely  represents a benign nodule. Hepatobiliary: No focal liver abnormality identified. Gallbladder appears unremarkable. The gallbladder appears unremarkable. A patent common bile duct measures up to 0.9 cm and there is possible filling defect within the distal CBD measuring 5 mm, image 38/3. Pancreas: No pancreatic inflammation, main duct dilatation or mass. Spleen: Normal in size without focal abnormality. Adrenals/Urinary Tract: Normal adrenal glands. Bilateral kidney cysts are identified. The largest arises off the inferior pole of the left kidney measuring 8.4 by 5.1 cm. There also  scattered too small to reliably characterize renal hypodensities. No follow-up imaging recommended. No signs of nephrolithiasis or hydronephrosis. Urinary bladder is unremarkable. Stomach/Bowel: Stomach appears within normal limits. The appendix is not visualized and may be surgically absent. There is abnormal bowel wall edema and inflammation involving the distal transverse colon through the sigmoid colon concerning for acute colitis. There is no sign of pneumatosis or bowel perforation. No evidence for bowel obstruction. Vascular/Lymphatic: Aortic atherosclerotic calcifications. No signs of abdominopelvic adenopathy. Reproductive: Uterus and bilateral adnexa are unremarkable. Other: No free fluid or fluid collections. No signs of pneumoperitoneum. Musculoskeletal: Extensive postoperative changes identified involving the lumbar spine compatible with prior thoracolumbar fusion. Unchanged appearance of chronic compression deformity involving the L3 vertebra. Previous bilateral hip arthroplasties. There is extensive sclerosis identified along bilateral SI joints compatible with chronic sacroiliitis. IMPRESSION: 1. There is abnormal bowel wall edema and inflammation involving the distal transverse colon through the sigmoid colon concerning for acute colitis. No sign of pneumatosis or bowel perforation. 2. Mild dilatation of the common bile  duct with possible filling defect within the distal CBD measuring 5 mm. Correlate for any clinical signs or symptoms of biliary obstruction. If there is a clinical concern for choledocholithiasis further evaluation with MRCP may be helpful. 3. Chronic bilateral sacroiliitis. 4. Unchanged subpleural nodule within the right lower lobe abutting the major fissure measuring 6 mm. This was present on examination from 12/16/2019 and most likely represents a benign nodule. 5. Extensive postoperative changes identified involving the lumbar spine compatible with prior thoracolumbar fusion. Unchanged appearance of chronic compression deformity involving the L3 vertebra. 6.  Aortic Atherosclerosis (ICD10-I70.0). Electronically Signed   By: Signa Kell M.D.   On: 10/06/2022 16:10    Impression/Plan: Left-sided colitis with bloody diarrhea and abdominal pain likely due to ischemic colitis but infectious source still possible. GI pathogen panel, C diff pending. Continue IV Abx. If bloody diarrhea continues and stool studies unrevealing then will need a flex sig to further evaluate. Supportive care. Will f/u.    LOS: 1 day   Shirley Friar  10/08/2022, 1:12 PM  Questions please call [phone]

## 2022-10-08 NOTE — ED Notes (Signed)
Floor states ready to receive patient

## 2022-10-08 NOTE — Progress Notes (Signed)
[facility]  PROGRESS NOTE  Suzanne Harrison [id number] DOB: 08-25-1951 DOA: 10/06/2022 PCP: Kelton Pillar, MD   Brief HPI:   71 year old female with history of essential hypertension, hyperlipidemia, hypothyroidism, anemia of chronic disease, baseline hemoglobin 9-12 came to hospital with severe sepsis due to colitis after she came to ED with abdominal pain.  Patient was having bloody diarrhea at home.  In the ED CT abdomen/pelvis with contrast showed abnormal bowel wall edema and inflammation involving the distal transverse colon the sigmoid colon concerning for acute colitis in the absence of any evidence of abscess or bowel perforation.  There is also evidence of mild dilation of the common bile duct with distal common bile duct measuring 5 mm in the absence of any evidence of choledocholithiasis. Patient started on Rocephin, Flagyl for possible infectious colitis.    Subjective   Patient seen and examined, was unable to give stool sample for C. difficile or GI pathogen panel, as she did not have any more episodes of diarrhea in the hospital.  This morning patient had 1 episode of bloody diarrhea which was mixed with stool.   Assessment/Plan:     Colitis/hematochezia -CT abdomen confirms colitis in distal transverse colon to sigmoid colon -Stool studies currently pending as patient unable to provide sample -Patient started empirically on ceftriaxone and Flagyl -We will consult GI, as patient may not have infectious etiology  Acute kidney injury -Likely due to hypotension on admission -Continue IV fluids -Creatinine has improved to 1.20 -Follow BMP in am  Acute blood loss anemia -Secondary to hematochezia from colitis -Hemoglobin stable at 9.6 -Continue to follow serial hemoglobin  Hyponatremia, mild -Improved, sodium is 134  Hyperlipidemia -Continue statin  Hypertension -Lisinopril, atenolol were held due to hypotension -Continue to hold his  medications  Chronic pain syndrome -Continue Percocet, Lyrica  Hypothyroidism -Continue Synthroid   Medications     citalopram  40 mg Oral Daily   levothyroxine  50 mcg Oral Daily   mirabegron ER  50 mg Oral Daily   pantoprazole  40 mg Oral BID   pregabalin  150 mg Oral BID   rosuvastatin  10 mg Oral QHS     Data Reviewed:   CBG:  No results for input(s): "GLUCAP" in the last 168 hours.  SpO2: 94 %    Vitals:   10/08/22 0633 10/08/22 0809 10/08/22 1443 10/08/22 1644  BP: 128/63 (!) 122/56 138/62 (!) 142/66  Pulse:  82 75 80  Resp:  (!) 23 16 (!) 21  Temp: 98.4 F (36.9 C) 98.1 F (36.7 C) 98.4 F (36.9 C) 99.1 F (37.3 C)  TempSrc: Oral Oral Oral Oral  SpO2:  96% 97% 94%  Weight: 85.4 kg     Height:          Data Reviewed:  Basic Metabolic Panel: Recent Labs  Lab 10/06/22 1250 10/07/22 0440 10/08/22 0319  NA 130* 133* 134*  K 4.8 4.5 4.0  CL 100 107 105  CO2 19* 21* 23  GLUCOSE 160* 107* 96  BUN 18 23 14   CREATININE 1.53* 1.81* 1.20*  CALCIUM 9.1 7.8* 7.9*  MG  --  1.7  --     CBC: Recent Labs  Lab 10/06/22 1250 10/07/22 0325 10/07/22 0440 10/07/22 1018 10/07/22 1813 10/08/22 0319  WBC 13.6*  --  9.7  --  10.4 9.0  NEUTROABS 11.5*  --  7.4  --   --   --   HGB 13.9 10.7* 10.1* 10.8* 9.6* 9.6*  HCT 42.1 32.6* 31.0* 32.9* 30.0* 29.3*  MCV 83.7  --  85.9  --  87.2 87.5  PLT 196  --  123*  --  134* 100*    LFT Recent Labs  Lab 10/06/22 1250 10/07/22 0440 10/08/22 0319  AST 19 12* 13*  ALT 11 11 8   ALKPHOS 72 54 54  BILITOT 0.8 0.7 0.4  PROT 6.3* 4.9* 4.8*  ALBUMIN 3.3* 2.4* 2.3*     Antibiotics: Anti-infectives (From admission, onward)    Start     Dose/Rate Route Frequency Ordered Stop   10/07/22 2200  cefTRIAXone (ROCEPHIN) 2 g in sodium chloride 0.9 % 100 mL IVPB        2 g 200 mL/hr over 30 Minutes Intravenous Every 24 hours 10/07/22 0356     10/07/22 1100  metroNIDAZOLE (FLAGYL) IVPB 500 mg        500 mg 100  mL/hr over 60 Minutes Intravenous Every 12 hours 10/07/22 0356     10/07/22 0330  cefTRIAXone (ROCEPHIN) 2 g in sodium chloride 0.9 % 100 mL IVPB        2 g 200 mL/hr over 30 Minutes Intravenous  Once 10/07/22 0316 10/07/22 0413   10/07/22 0330  metroNIDAZOLE (FLAGYL) IVPB 500 mg        500 mg 100 mL/hr over 60 Minutes Intravenous  Once 10/07/22 0316 10/07/22 0503        DVT prophylaxis: SCDs  Code Status: Full code  Family Communication: No family at bedside   CONSULTS    Objective    Physical Examination:  General-appears in no acute distress Heart-S1-S2, regular, no murmur auscultated Lungs-clear to auscultation bilaterally, no wheezing or crackles auscultated Abdomen-soft, mild tenderness to palpation throughout abdomen Extremities-no edema in the lower extremities Neuro-alert, oriented x3, no focal deficit noted  Status is: Inpatient:             13/08/23   [facility] If 7PM-7AM, please contact night-coverage at [email or web address], Office  [phone]   10/08/2022, 5:35 PM  LOS: 1 day

## 2022-10-09 DIAGNOSIS — K529 Noninfective gastroenteritis and colitis, unspecified: Secondary | ICD-10-CM | POA: Diagnosis not present

## 2022-10-09 DIAGNOSIS — Z8679 Personal history of other diseases of the circulatory system: Secondary | ICD-10-CM | POA: Diagnosis not present

## 2022-10-09 DIAGNOSIS — D638 Anemia in other chronic diseases classified elsewhere: Secondary | ICD-10-CM | POA: Diagnosis not present

## 2022-10-09 DIAGNOSIS — K922 Gastrointestinal hemorrhage, unspecified: Secondary | ICD-10-CM | POA: Diagnosis not present

## 2022-10-09 LAB — GASTROINTESTINAL PANEL BY PCR, STOOL (REPLACES STOOL CULTURE)

## 2022-10-09 LAB — COMPREHENSIVE METABOLIC PANEL
ALT: 9 U/L (ref 0–44)
AST: 12 U/L — ABNORMAL LOW (ref 15–41)
Albumin: 2.4 g/dL — ABNORMAL LOW (ref 3.5–5.0)
Alkaline Phosphatase: 58 U/L (ref 38–126)
Anion gap: 7 (ref 5–15)
BUN: 6 mg/dL — ABNORMAL LOW (ref 8–23)
CO2: 25 mmol/L (ref 22–32)
Calcium: 8.2 mg/dL — ABNORMAL LOW (ref 8.9–10.3)
Chloride: 104 mmol/L (ref 98–111)
Creatinine, Ser: 0.9 mg/dL (ref 0.44–1.00)
GFR, Estimated: >60 mL/min (ref >60)
Glucose, Bld: 104 mg/dL — ABNORMAL HIGH (ref 70–99)
Potassium: 3.4 mmol/L — ABNORMAL LOW (ref 3.5–5.1)
Sodium: 136 mmol/L (ref 135–145)
Total Bilirubin: 0.4 mg/dL (ref 0.3–1.2)
Total Protein: 5 g/dL — ABNORMAL LOW (ref 6.5–8.1)

## 2022-10-09 LAB — CBC
HCT: 27 % — ABNORMAL LOW (ref 36.0–46.0)
Hemoglobin: 8.9 g/dL — ABNORMAL LOW (ref 12.0–15.0)
MCH: 27.6 pg (ref 26.0–34.0)
MCHC: 33 g/dL (ref 30.0–36.0)
MCV: 83.9 fL (ref 80.0–100.0)
Platelets: 114 10*3/uL — ABNORMAL LOW (ref 150–400)
RBC: 3.22 MIL/uL — ABNORMAL LOW (ref 3.87–5.11)
RDW: 14.3 % (ref 11.5–15.5)
WBC: 6.4 10*3/uL (ref 4.0–10.5)
nRBC: 0 % (ref 0.0–0.2)

## 2022-10-09 MED ORDER — CALCIUM CARBONATE ANTACID 500 MG PO CHEW
1.0000 | CHEWABLE_TABLET | Freq: Three times a day (TID) | ORAL | Status: DC | PRN
  Administered 2022-10-09: 200 mg via ORAL
  Filled 2022-10-09: qty 1

## 2022-10-09 MED ORDER — ATENOLOL 25 MG PO TABS
50.0000 mg | ORAL_TABLET | Freq: Every day | ORAL | Status: DC
  Administered 2022-10-09 – 2022-10-10 (×2): 50 mg via ORAL
  Filled 2022-10-09 (×2): qty 2

## 2022-10-09 MED ORDER — POTASSIUM CHLORIDE CRYS ER 20 MEQ PO TBCR
40.0000 meq | EXTENDED_RELEASE_TABLET | Freq: Once | ORAL | Status: AC
  Administered 2022-10-09: 40 meq via ORAL
  Filled 2022-10-09: qty 2

## 2022-10-09 NOTE — Plan of Care (Signed)
  Problem: Education: Goal: Knowledge of General Education information will improve Description: Including pain rating scale, medication(s)/side effects and non-pharmacologic comfort measures Outcome: Progressing   Problem: Health Behavior/Discharge Planning: Goal: Ability to manage health-related needs will improve Outcome: Progressing   Problem: Clinical Measurements: Goal: Cardiovascular complication will be avoided Outcome: Progressing   Problem: Pain Managment: Goal: General experience of comfort will improve Outcome: Progressing   Problem: Elimination: Goal: Will not experience complications related to bowel motility Outcome: Progressing

## 2022-10-09 NOTE — Progress Notes (Signed)
GI Panel pending. Placed on Enteric precautions.

## 2022-10-09 NOTE — Progress Notes (Signed)
[facility] Gastroenterology Progress Note  Suzanne Harrison 71 y.o. Jul 01, 1951  Subjective: Complaining still having abdominal pain worse in LLQ but reports it has improved today.  C. difficile negative, GI pathogen panel pending.  Had a bowel movement last night with some blood, nothing yet this morning.  Denies nausea, vomiting, fever, chills, dysuria.  ROS : Review of Systems  Constitutional:  Negative for chills and fever.  Gastrointestinal:  Positive for abdominal pain, blood in stool and diarrhea. Negative for constipation, heartburn, melena, nausea and vomiting.      Objective: Vital signs in last 24 hours: Vitals:   10/08/22 2022 10/09/22 0449  BP: 139/77 (!) 166/76  Pulse: 81 66  Resp: 16 17  Temp: 98 F (36.7 C) 98 F (36.7 C)  SpO2: 98% 98%    Physical Exam:  General:  Alert, cooperative, no distress, appears stated age  Head:  Normocephalic, without obvious abnormality, atraumatic  Eyes:  Anicteric sclera, EOM's intact  Lungs:   Clear to auscultation bilaterally, respirations unlabored  Heart:  Regular rate and rhythm, S1, S2 normal  Abdomen:   Soft, tender to palpation LLQ, bowel sounds active all four quadrants  Extremities: Extremities normal, atraumatic, no  edema  Pulses: 2+ and symmetric    Lab Results: Recent Labs    10/07/22 0440 10/08/22 0319 10/09/22 0141  NA 133* 134* 136  K 4.5 4.0 3.4*  CL 107 105 104  CO2 21* 23 25  GLUCOSE 107* 96 104*  BUN 23 14 6*  CREATININE 1.81* 1.20* 0.90  CALCIUM 7.8* 7.9* 8.2*  MG 1.7  --   --    Recent Labs    10/08/22 0319 10/09/22 0141  AST 13* 12*  ALT 8 9  ALKPHOS 54 58  BILITOT 0.4 0.4  PROT 4.8* 5.0*  ALBUMIN 2.3* 2.4*   Recent Labs    10/06/22 1250 10/07/22 0325 10/07/22 0440 10/07/22 1018 10/08/22 0319 10/09/22 0141  WBC 13.6*  --  9.7   < > 9.0 6.4  NEUTROABS 11.5*  --  7.4  --   --   --   HGB 13.9   < > 10.1*   < > 9.6* 8.9*  HCT 42.1   < > 31.0*   < > 29.3* 27.0*  MCV 83.7  --   85.9   < > 87.5 83.9  PLT 196  --  123*   < > 100* 114*   < > = values in this interval not displayed.   Recent Labs    10/06/22 1250  LABPROT 13.8  INR 1.1    Assessment Colitis, bloody diarrhea, abdominal pain - Differential includes ischemic vs infectious colitis - C. difficile negative, GI pathogen panel pending - Receiving IV antibiotics - CT A/P 10/06/2022 shows bowel wall edema and inflammation involving the distal transverse colon through the sigmoid colon, no sign of pneumatosis or bowel perforation. Mild dilatation of the CBD with possible filling defect within the distal CBD measuring 5 mm. - Labs this morning show normal LFTs and total bilirubin - Iron low, 27 - Hemoglobin 8.9 (9.6) - No leukocytosis  Plan: C. Diff negative, GI pathogen panel pending. Will follow.  If negative for infection and no improvement in bloody diarrhea may need flex sig for further evaluation. Continue IV antibiotics. Continue supportive care.  GI will follow.  Berdine Dance PA-C 10/09/2022, 8:38 AM  Contact #  [phone]

## 2022-10-09 NOTE — Progress Notes (Signed)
[facility]  PROGRESS NOTE  Suzanne Harrison IRS:[id number] DOB: 01/20/1951 DOA: 10/06/2022 PCP: Maurice Small, MD   Brief HPI:   71 year old female with history of essential hypertension, hyperlipidemia, hypothyroidism, anemia of chronic disease, baseline hemoglobin 9-12 came to hospital with severe sepsis due to colitis after she came to ED with abdominal pain.  Patient was having bloody diarrhea at home.  In the ED CT abdomen/pelvis with contrast showed abnormal bowel wall edema and inflammation involving the distal transverse colon the sigmoid colon concerning for acute colitis in the absence of any evidence of abscess or bowel perforation.  There is also evidence of mild dilation of the common bile duct with distal common bile duct measuring 5 mm in the absence of any evidence of choledocholithiasis. Patient started on Rocephin, Flagyl for possible infectious colitis.    Subjective   Patient seen and examined, diarrhea has improved.  Stool for GI pathogen panel as well as C. difficile was negative   Assessment/Plan:     Colitis/hematochezia -CT abdomen confirms colitis in distal transverse colon to sigmoid colon -GI pathogen panel and C. difficile PCR was negative -Patient started empirically on ceftriaxone and Flagyl -GI consult obtained -GI feels it is likely ischemic colitis, recommend to complete 5 days of antibiotics -No plan for flexible sigmoidoscopy  CBD dilation -Seen on CT abdomen/pelvis -Patient has history of cholecystitis in 2021 which was treated with cholecystostomy tube -LFTs are normal, patient has no tenderness in right upper quadrant -Discussed with Dr. Bosie Clos, no indication for MRCP  Acute kidney injury -Likely due to hypotension on admission -Continue IV fluids -Creatinine has improved to 0.90 -Follow BMP in am  Acute blood loss anemia -Secondary to hematochezia from colitis -Hemoglobin stable at 8.9 -Continue to follow serial  hemoglobin  Hyponatremia, mild -Improved, sodium is 134  Hyperlipidemia -Continue statin  Hypertension -Lisinopril, atenolol were held due to hypotension -Blood pressure is now continue to rise, will start atenolol  Chronic pain syndrome -Continue Percocet, Lyrica  Hypothyroidism -Continue Synthroid  Hypokalemia -Potassium is 3.4, replace potassium and follow BMP in am  Medications     citalopram  40 mg Oral Daily   levothyroxine  50 mcg Oral Daily   mirabegron ER  50 mg Oral Daily   pantoprazole  40 mg Oral BID   pregabalin  150 mg Oral BID   rosuvastatin  10 mg Oral QHS     Data Reviewed:   CBG:  No results for input(s): "GLUCAP" in the last 168 hours.  SpO2: 98 %    Vitals:   10/08/22 2022 10/09/22 0449 10/09/22 1027 10/09/22 1342  BP: 139/77 (!) 166/76 (!) 155/80   Pulse: 81 66 64   Resp: 16 17 12 19   Temp: 98 F (36.7 C) 98 F (36.7 C) 98 F (36.7 C)   TempSrc: Oral Oral Oral   SpO2: 98% 98%    Weight:  83 kg    Height:          Data Reviewed:  Basic Metabolic Panel: Recent Labs  Lab 10/06/22 1250 10/07/22 0440 10/08/22 0319 10/09/22 0141  NA 130* 133* 134* 136  K 4.8 4.5 4.0 3.4*  CL 100 107 105 104  CO2 19* 21* 23 25  GLUCOSE 160* 107* 96 104*  BUN 18 23 14  6*  CREATININE 1.53* 1.81* 1.20* 0.90  CALCIUM 9.1 7.8* 7.9* 8.2*  MG  --  1.7  --   --     CBC: Recent Labs  Lab  10/06/22 1250 10/07/22 0325 10/07/22 0440 10/07/22 1018 10/07/22 1813 10/08/22 0319 10/09/22 0141  WBC 13.6*  --  9.7  --  10.4 9.0 6.4  NEUTROABS 11.5*  --  7.4  --   --   --   --   HGB 13.9   < > 10.1* 10.8* 9.6* 9.6* 8.9*  HCT 42.1   < > 31.0* 32.9* 30.0* 29.3* 27.0*  MCV 83.7  --  85.9  --  87.2 87.5 83.9  PLT 196  --  123*  --  134* 100* 114*   < > = values in this interval not displayed.    LFT Recent Labs  Lab 10/06/22 1250 10/07/22 0440 10/08/22 0319 10/09/22 0141  AST 19 12* 13* 12*  ALT 11 11 8 9   ALKPHOS 72 54 54 58  BILITOT 0.8  0.7 0.4 0.4  PROT 6.3* 4.9* 4.8* 5.0*  ALBUMIN 3.3* 2.4* 2.3* 2.4*     Antibiotics: Anti-infectives (From admission, onward)    Start     Dose/Rate Route Frequency Ordered Stop   10/07/22 2200  cefTRIAXone (ROCEPHIN) 2 g in sodium chloride 0.9 % 100 mL IVPB        2 g 200 mL/hr over 30 Minutes Intravenous Every 24 hours 10/07/22 0356     10/07/22 1100  metroNIDAZOLE (FLAGYL) IVPB 500 mg        500 mg 100 mL/hr over 60 Minutes Intravenous Every 12 hours 10/07/22 0356     10/07/22 0330  cefTRIAXone (ROCEPHIN) 2 g in sodium chloride 0.9 % 100 mL IVPB        2 g 200 mL/hr over 30 Minutes Intravenous  Once 10/07/22 0316 10/07/22 0413   10/07/22 0330  metroNIDAZOLE (FLAGYL) IVPB 500 mg        500 mg 100 mL/hr over 60 Minutes Intravenous  Once 10/07/22 0316 10/07/22 0503        DVT prophylaxis: SCDs  Code Status: Full code  Family Communication: Husband at bedside   CONSULTS    Objective    Physical Examination:  General-appears in no acute distress Heart-S1-S2, regular, no murmur auscultated Lungs-clear to auscultation bilaterally, no wheezing or crackles auscultated Abdomen-soft, nontender, no organomegaly Extremities-no edema in the lower extremities Neuro-alert, oriented x3, no focal deficit noted  Status is: Inpatient:             13/08/23   [facility] If 7PM-7AM, please contact night-coverage at [email or web address], Office  [phone]   10/09/2022, 2:54 PM  LOS: 2 days

## 2022-10-10 DIAGNOSIS — K529 Noninfective gastroenteritis and colitis, unspecified: Secondary | ICD-10-CM | POA: Diagnosis not present

## 2022-10-10 DIAGNOSIS — N179 Acute kidney failure, unspecified: Secondary | ICD-10-CM | POA: Diagnosis not present

## 2022-10-10 DIAGNOSIS — K625 Hemorrhage of anus and rectum: Secondary | ICD-10-CM

## 2022-10-10 LAB — BASIC METABOLIC PANEL
Anion gap: 6 (ref 5–15)
BUN: 5 mg/dL — ABNORMAL LOW (ref 8–23)
CO2: 26 mmol/L (ref 22–32)
Calcium: 8.5 mg/dL — ABNORMAL LOW (ref 8.9–10.3)
Chloride: 103 mmol/L (ref 98–111)
Creatinine, Ser: 0.88 mg/dL (ref 0.44–1.00)
GFR, Estimated: >60 mL/min (ref >60)
Glucose, Bld: 87 mg/dL (ref 70–99)
Potassium: 3.7 mmol/L (ref 3.5–5.1)
Sodium: 135 mmol/L (ref 135–145)

## 2022-10-10 LAB — CBC
HCT: 33.7 % — ABNORMAL LOW (ref 36.0–46.0)
Hemoglobin: 10.8 g/dL — ABNORMAL LOW (ref 12.0–15.0)
MCH: 27.3 pg (ref 26.0–34.0)
MCHC: 32 g/dL (ref 30.0–36.0)
MCV: 85.3 fL (ref 80.0–100.0)
Platelets: 158 10*3/uL (ref 150–400)
RBC: 3.95 MIL/uL (ref 3.87–5.11)
RDW: 14.3 % (ref 11.5–15.5)
WBC: 7.3 10*3/uL (ref 4.0–10.5)
nRBC: 0 % (ref 0.0–0.2)

## 2022-10-10 MED ORDER — CEFDINIR 300 MG PO CAPS: 300.0000 mg | ORAL_CAPSULE | Freq: Two times a day (BID) | ORAL | 0 refills | Status: DC

## 2022-10-10 MED ORDER — METRONIDAZOLE 500 MG PO TABS: 500.0000 mg | ORAL_TABLET | Freq: Two times a day (BID) | ORAL | 0 refills | Status: AC

## 2022-10-10 NOTE — Progress Notes (Signed)
Mobility Specialist - Progress Note   10/10/22 1347  Mobility  Activity Ambulated with assistance in hallway  Level of Assistance Standby assist, set-up cues, supervision of patient - no hands on  Assistive Device None  Distance Ambulated (ft) 200 ft  Activity Response Tolerated well  Mobility Referral Yes  $Mobility charge 1 Mobility   Pt received in bed and agreeable to mobility. No complaints throughout. Pt was left EOB with all needs met.  Franki Monte  Mobility Specialist Please contact via Solicitor or Rehab office at [phone]

## 2022-10-10 NOTE — Progress Notes (Signed)
Patient requested to have blood works at 7 am.

## 2022-10-10 NOTE — Discharge Summary (Addendum)
Physician Discharge Summary   Patient: Suzanne Harrison MRN: [id number] DOB: 07-Jan-1951  Admit date:     10/06/2022  Discharge date: 10/10/22  Discharge Physician: Meredeth Ide   PCP: Maurice Small, MD   Recommendations at discharge:   Follow-up PCP in 1 week Follow-up with gastroenterology as outpatient  Discharge Diagnoses: Principal Problem:   Colitis Active Problems:   HLD (hyperlipidemia)   Acquired hypothyroidism   Severe sepsis (HCC)   AKI (acute kidney injury) (HCC)   Acute hyponatremia   Acute lower GI bleeding   History of essential hypertension   Anemia of chronic disease  Resolved Problems:   * No resolved hospital problems. *  Hospital Course: 71 year old female with history of essential hypertension, hyperlipidemia, hypothyroidism, anemia of chronic disease, baseline hemoglobin 9-12 came to hospital with severe sepsis due to colitis after she came to ED with abdominal pain.  Patient was having bloody diarrhea at home.  In the ED CT abdomen/pelvis with contrast showed abnormal bowel wall edema and inflammation involving the distal transverse colon the sigmoid colon concerning for acute colitis in the absence of any evidence of abscess or bowel perforation.  There is also evidence of mild dilation of the common bile duct with distal common bile duct measuring 5 mm in the absence of any evidence of choledocholithiasis. Patient started on Rocephin, Flagyl for possible infectious colitis.    Assessment and Plan:  Colitis/hematochezia -Resolved -CT abdomen confirms colitis in distal transverse colon to sigmoid colon -GI pathogen panel and C. difficile PCR was negative -Patient started empirically on ceftriaxone and Flagyl -GI consult obtained -GI feels it is likely ischemic colitis, recommend to complete 5 days of antibiotics -No plan for flexible sigmoidoscopy -Will discharge on Omnicef and Flagyl for 2 more days   CBD dilation -Seen on CT  abdomen/pelvis -Patient has history of cholecystitis in 2021 which was treated with cholecystostomy tube -LFTs are normal, patient has no tenderness in right upper quadrant -Discussed with Dr. Bosie Clos, no indication for MRCP   Acute kidney injury -Likely due to hypotension on admission -Continue IV fluids -Creatinine has improved to 0.88    Acute blood loss anemia -Secondary to hematochezia from colitis -Hemoglobin stable at 10.8   Hyponatremia, mild -Improved, sodium is 134   Hyperlipidemia -Continue statin   Hypertension -Lisinopril, atenolol were held due to hypotension -Continue taking these medications at home   Chronic pain syndrome -Continue Percocet, Lyrica   Hypothyroidism -Continue Synthroid   Hypokalemia -Replete          Consultants:  Procedures performed:  Disposition: Home Diet recommendation:  Discharge Diet Orders (From admission, onward)     Start     Ordered   10/10/22 0000  Diet - low sodium heart healthy        10/10/22 1336           Regular diet DISCHARGE MEDICATION: Allergies as of 10/10/2022       Reactions   Sulfamethazine Other (See Comments)   Gatifloxacin Other (See Comments), Rash   Other reaction(s): Myalgias (Muscle Pain) Rash in mouth   Sulfa Antibiotics Nausea Only        Medication List     STOP taking these medications    cephALEXin 500 MG capsule Commonly known as: KEFLEX       TAKE these medications    Artificial Tears 1.4 % ophthalmic solution Generic drug: polyvinyl alcohol Place 1 drop into both eyes 2 (two) times daily as needed for dry  eyes.   aspirin EC 81 MG tablet Take 81 mg by mouth at bedtime.   atenolol 50 MG tablet Commonly known as: TENORMIN Take 50 mg by mouth daily.   CALCIUM-D PO Take 1 tablet by mouth 2 (two) times daily.   cefdinir 300 MG capsule Commonly known as: OMNICEF Take 1 capsule (300 mg total) by mouth 2 (two) times daily.   cetirizine 10 MG  tablet Commonly known as: ZYRTEC Take 10 mg by mouth daily.   citalopram 40 MG tablet Commonly known as: CELEXA Take 40 mg by mouth daily.   Denta 5000 Plus 1.1 % Crea dental cream Generic drug: sodium fluoride Place 1 application onto teeth 2 (two) times daily. Do not eat, drink or rinse for 30 minutes after use.   diclofenac Sodium 1 % Gel Commonly known as: VOLTAREN Apply 2 g topically 4 (four) times daily as needed (pain).   furosemide 20 MG tablet Commonly known as: LASIX 20 mg daily as needed for fluid.   Hair/Skin/Nails/Biotin Tabs Take 1 tablet by mouth daily.   ipratropium-albuterol 0.5-2.5 (3) MG/3ML Soln Commonly known as: DUONEB Inhale 3 mLs into the lungs every 6 (six) hours as needed (shortness of breath).   levothyroxine 50 MCG tablet Commonly known as: SYNTHROID Take 50 mcg by mouth at bedtime.   lisinopril 5 MG tablet Commonly known as: ZESTRIL Take 5 mg by mouth daily.   Lyrica 150 MG capsule Generic drug: pregabalin Take 150 mg by mouth 2 (two) times daily.   metroNIDAZOLE 500 MG tablet Commonly known as: Flagyl Take 1 tablet (500 mg total) by mouth 2 (two) times daily for 2 days.   multivitamin with minerals Tabs tablet Take 1 tablet by mouth daily.   Myrbetriq 50 MG Tb24 tablet Generic drug: mirabegron ER Take 50 mg by mouth daily.   oxyCODONE-acetaminophen 10-325 MG tablet Commonly known as: PERCOCET Take 1 tablet by mouth every 6 (six) hours as needed for pain.   pantoprazole 40 MG tablet Commonly known as: PROTONIX Take 40 mg by mouth 2 (two) times daily.   rosuvastatin 10 MG tablet Commonly known as: CRESTOR Take 10 mg by mouth at bedtime.   tolterodine 2 MG 24 hr capsule Commonly known as: DETROL LA Take 2 mg by mouth daily.        Discharge Exam: Filed Weights   10/08/22 0254 10/09/22 0449 10/10/22 0402  Weight: 85.4 kg 83 kg 82.1 kg   General-appears in no acute distress Heart-S1-S2, regular, no murmur  auscultated Lungs-clear to auscultation bilaterally, no wheezing or crackles auscultated Abdomen-soft, nontender, no organomegaly Extremities-no edema in the lower extremities Neuro-alert, oriented x3, no focal deficit noted  Condition at discharge: good  The results of significant diagnostics from this hospitalization (including imaging, microbiology, ancillary and laboratory) are listed below for reference.   Imaging Studies: US RENAL  Result Date: 10/07/2022 CLINICAL DATA:  Renal dysfunction EXAM: RENAL / URINARY TRACT ULTRASOUND COMPLETE COMPARISON:  CT done on 10/06/2022 FINDINGS: Right Kidney: Renal measurements: 10.3 x 5.2 x 5.5 cm = volume: 153.8 mL. There is no hydronephrosis. There is slightly increased echogenicity in the renal cortex. There is 6 x 5.5 x 5.7 cm cyst in the upper pole. Left Kidney: Renal measurements: 12 x 5.4 x 5.3 cm = volume: 181.6 mL. There is no hydronephrosis. There is slightly increased cortical echogenicity. There is a large cyst measuring 8.6 x 6.8 x 4.8 cm in size in the lower pole. Bladder: Appears normal for degree of  bladder distention. Other: None. IMPRESSION: There is no hydronephrosis. Bilateral renal cysts. Subtle increase in echogenicity and renal cortex may be a normal variation or suggest medical renal disease. Electronically Signed   By: Elmer Picker M.D.   On: 10/07/2022 14:47   DG Chest Port 1 View  Result Date: 10/07/2022 CLINICAL DATA:  Rectal bleeding.  Sepsis. EXAM: PORTABLE CHEST 1 VIEW COMPARISON:  12/03/2021 FINDINGS: 0641 hours. The cardio pericardial silhouette is enlarged. There is pulmonary vascular congestion without overt pulmonary edema. The lungs are clear without focal pneumonia, edema, pneumothorax or pleural effusion. Extensive thoracic fusion hardware evident, incompletely visualized. Bones are diffusely demineralized. Telemetry leads overlie the chest. IMPRESSION: Enlargement of the cardiopericardial silhouette with  pulmonary vascular congestion. Electronically Signed   By: Misty Stanley M.D.   On: 10/07/2022 06:52   CT Abdomen Pelvis W Contrast  Result Date: 10/06/2022 CLINICAL DATA:  Abdominal pain, acute. EXAM: CT ABDOMEN AND PELVIS WITH CONTRAST TECHNIQUE: Multidetector CT imaging of the abdomen and pelvis was performed using the standard protocol following bolus administration of intravenous contrast. RADIATION DOSE REDUCTION: This exam was performed according to the departmental dose-optimization program which includes automated exposure control, adjustment of the mA and/or kV according to patient size and/or use of iterative reconstruction technique. CONTRAST:  23mL OMNIPAQUE IOHEXOL 350 MG/ML SOLN COMPARISON:  12/16/2019 FINDINGS: Lower chest: No acute findings. Unchanged subpleural nodule within the right lower lobe abutting the major fissure measuring 6 mm. This was present on examination from 12/16/2019 and most likely represents a benign nodule. Hepatobiliary: No focal liver abnormality identified. Gallbladder appears unremarkable. The gallbladder appears unremarkable. A patent common bile duct measures up to 0.9 cm and there is possible filling defect within the distal CBD measuring 5 mm, image 38/3. Pancreas: No pancreatic inflammation, main duct dilatation or mass. Spleen: Normal in size without focal abnormality. Adrenals/Urinary Tract: Normal adrenal glands. Bilateral kidney cysts are identified. The largest arises off the inferior pole of the left kidney measuring 8.4 by 5.1 cm. There also scattered too small to reliably characterize renal hypodensities. No follow-up imaging recommended. No signs of nephrolithiasis or hydronephrosis. Urinary bladder is unremarkable. Stomach/Bowel: Stomach appears within normal limits. The appendix is not visualized and may be surgically absent. There is abnormal bowel wall edema and inflammation involving the distal transverse colon through the sigmoid colon concerning for  acute colitis. There is no sign of pneumatosis or bowel perforation. No evidence for bowel obstruction. Vascular/Lymphatic: Aortic atherosclerotic calcifications. No signs of abdominopelvic adenopathy. Reproductive: Uterus and bilateral adnexa are unremarkable. Other: No free fluid or fluid collections. No signs of pneumoperitoneum. Musculoskeletal: Extensive postoperative changes identified involving the lumbar spine compatible with prior thoracolumbar fusion. Unchanged appearance of chronic compression deformity involving the L3 vertebra. Previous bilateral hip arthroplasties. There is extensive sclerosis identified along bilateral SI joints compatible with chronic sacroiliitis. IMPRESSION: 1. There is abnormal bowel wall edema and inflammation involving the distal transverse colon through the sigmoid colon concerning for acute colitis. No sign of pneumatosis or bowel perforation. 2. Mild dilatation of the common bile duct with possible filling defect within the distal CBD measuring 5 mm. Correlate for any clinical signs or symptoms of biliary obstruction. If there is a clinical concern for choledocholithiasis further evaluation with MRCP may be helpful. 3. Chronic bilateral sacroiliitis. 4. Unchanged subpleural nodule within the right lower lobe abutting the major fissure measuring 6 mm. This was present on examination from 12/16/2019 and most likely represents a benign nodule. 5. Extensive postoperative  changes identified involving the lumbar spine compatible with prior thoracolumbar fusion. Unchanged appearance of chronic compression deformity involving the L3 vertebra. 6.  Aortic Atherosclerosis (ICD10-I70.0). Electronically Signed   By: Kerby Moors M.D.   On: 10/06/2022 16:10    Microbiology: Results for orders placed or performed during the hospital encounter of 10/06/22  Culture, blood (single)     Status: None (Preliminary result)   Collection Time: 10/07/22  3:35 AM   Specimen: BLOOD  Result  Value Ref Range Status   Specimen Description BLOOD LEFT ANTECUBITAL  Final   Special Requests   Final    BOTTLES DRAWN AEROBIC AND ANAEROBIC Blood Culture adequate volume   Culture   Final    NO GROWTH 3 DAYS Performed at [facility], [place] [address] [place], [place] [address]    Report Status PENDING  Incomplete  Gastrointestinal Panel by PCR , Stool     Status: None   Collection Time: 10/08/22  9:17 AM   Specimen: STOOL  Result Value Ref Range Status   Campylobacter species NOT DETECTED NOT DETECTED Final   Plesimonas shigelloides NOT DETECTED NOT DETECTED Final   Salmonella species NOT DETECTED NOT DETECTED Final   Yersinia enterocolitica NOT DETECTED NOT DETECTED Final   Vibrio species NOT DETECTED NOT DETECTED Final   Vibrio cholerae NOT DETECTED NOT DETECTED Final   Enteroaggregative E coli (EAEC) NOT DETECTED NOT DETECTED Final   Enteropathogenic E coli (EPEC) NOT DETECTED NOT DETECTED Final   Enterotoxigenic E coli (ETEC) NOT DETECTED NOT DETECTED Final   Shiga like toxin producing E coli (STEC) NOT DETECTED NOT DETECTED Final   Shigella/Enteroinvasive E coli (EIEC) NOT DETECTED NOT DETECTED Final   Cryptosporidium NOT DETECTED NOT DETECTED Final   Cyclospora cayetanensis NOT DETECTED NOT DETECTED Final   Entamoeba histolytica NOT DETECTED NOT DETECTED Final   Giardia lamblia NOT DETECTED NOT DETECTED Final   Adenovirus F40/41 NOT DETECTED NOT DETECTED Final   Astrovirus NOT DETECTED NOT DETECTED Final   Norovirus GI/GII NOT DETECTED NOT DETECTED Final   Rotavirus A NOT DETECTED NOT DETECTED Final   Sapovirus (I, II, IV, and V) NOT DETECTED NOT DETECTED Final    Comment: Performed at [facility], [place] [place], [place] [address]  C Difficile Quick Screen w PCR reflex     Status: None   Collection Time: 10/08/22  9:17 AM   Specimen: STOOL  Result Value Ref Range Status   C Diff antigen NEGATIVE NEGATIVE Final   C Diff toxin NEGATIVE NEGATIVE  Final   C Diff interpretation No C. difficile detected.  Final    Comment: Performed at [facility], [place] [address] [place], [place] [address]    Labs: CBC: Recent Labs  Lab 10/06/22 1250 10/07/22 0325 10/07/22 0440 10/07/22 1018 10/07/22 1813 10/08/22 0319 10/09/22 0141 10/10/22 0714  WBC 13.6*  --  9.7  --  10.4 9.0 6.4 7.3  NEUTROABS 11.5*  --  7.4  --   --   --   --   --   HGB 13.9   < > 10.1* 10.8* 9.6* 9.6* 8.9* 10.8*  HCT 42.1   < > 31.0* 32.9* 30.0* 29.3* 27.0* 33.7*  MCV 83.7  --  85.9  --  87.2 87.5 83.9 85.3  PLT 196  --  123*  --  134* 100* 114* 158   < > = values in this interval not displayed.   Basic Metabolic Panel: Recent  Labs  Lab 10/06/22 1250 10/07/22 0440 10/08/22 0319 10/09/22 0141 10/10/22 0714  NA 130* 133* 134* 136 135  K 4.8 4.5 4.0 3.4* 3.7  CL 100 107 105 104 103  CO2 19* 21* 23 25 26   GLUCOSE 160* 107* 96 104* 87  BUN 18 23 14  6* 5*  CREATININE 1.53* 1.81* 1.20* 0.90 0.88  CALCIUM 9.1 7.8* 7.9* 8.2* 8.5*  MG  --  1.7  --   --   --    Liver Function Tests: Recent Labs  Lab 10/06/22 1250 10/07/22 0440 10/08/22 0319 10/09/22 0141  AST 19 12* 13* 12*  ALT 11 11 8 9   ALKPHOS 72 54 54 58  BILITOT 0.8 0.7 0.4 0.4  PROT 6.3* 4.9* 4.8* 5.0*  ALBUMIN 3.3* 2.4* 2.3* 2.4*   CBG: No results for input(s): "GLUCAP" in the last 168 hours.  Discharge time spent: greater than 30 minutes.  Signed: Oswald Hillock, MD [facility] 10/10/2022

## 2022-10-12 LAB — CULTURE, BLOOD (SINGLE)
Culture: NO GROWTH
Special Requests: ADEQUATE

## 2023-07-24 ENCOUNTER — Encounter (HOSPITAL_COMMUNITY): Payer: Self-pay | Admitting: Emergency Medicine

## 2023-07-24 ENCOUNTER — Emergency Department (HOSPITAL_COMMUNITY): Payer: 59

## 2023-07-24 ENCOUNTER — Other Ambulatory Visit: Payer: Self-pay

## 2023-07-24 ENCOUNTER — Emergency Department (HOSPITAL_COMMUNITY)
Admission: EM | Admit: 2023-07-24 | Discharge: 2023-07-24 | Disposition: A | Discharge status: Home / Self Care | Payer: 59 | Attending: Emergency Medicine | Admitting: Emergency Medicine

## 2023-07-24 DIAGNOSIS — N189 Chronic kidney disease, unspecified: Secondary | ICD-10-CM | POA: Diagnosis not present

## 2023-07-24 DIAGNOSIS — R748 Abnormal levels of other serum enzymes: Secondary | ICD-10-CM

## 2023-07-24 DIAGNOSIS — U071 COVID-19: Secondary | ICD-10-CM

## 2023-07-24 DIAGNOSIS — K5900 Constipation, unspecified: Secondary | ICD-10-CM | POA: Diagnosis not present

## 2023-07-24 DIAGNOSIS — R1010 Upper abdominal pain, unspecified: Secondary | ICD-10-CM | POA: Diagnosis not present

## 2023-07-24 DIAGNOSIS — I129 Hypertensive chronic kidney disease with stage 1 through stage 4 chronic kidney disease, or unspecified chronic kidney disease: Secondary | ICD-10-CM | POA: Diagnosis not present

## 2023-07-24 DIAGNOSIS — R945 Abnormal results of liver function studies: Secondary | ICD-10-CM | POA: Insufficient documentation

## 2023-07-24 DIAGNOSIS — Z79899 Other long term (current) drug therapy: Secondary | ICD-10-CM | POA: Insufficient documentation

## 2023-07-24 DIAGNOSIS — Z7982 Long term (current) use of aspirin: Secondary | ICD-10-CM | POA: Diagnosis not present

## 2023-07-24 DIAGNOSIS — R059 Cough, unspecified: Secondary | ICD-10-CM | POA: Diagnosis present

## 2023-07-24 LAB — CBC
HCT: 35.4 % — ABNORMAL LOW (ref 36.0–46.0)
Hemoglobin: 11.6 g/dL — ABNORMAL LOW (ref 12.0–15.0)
MCH: 28.6 pg (ref 26.0–34.0)
MCHC: 32.8 g/dL (ref 30.0–36.0)
MCV: 87.2 fL (ref 80.0–100.0)
Platelets: 98 10*3/uL — ABNORMAL LOW (ref 150–400)
RBC: 4.06 MIL/uL (ref 3.87–5.11)
RDW: 15 % (ref 11.5–15.5)
WBC: 8.2 10*3/uL (ref 4.0–10.5)
nRBC: 0 % (ref 0.0–0.2)

## 2023-07-24 LAB — URINALYSIS, ROUTINE W REFLEX MICROSCOPIC
Glucose, UA: NEGATIVE mg/dL
Hgb urine dipstick: NEGATIVE
Ketones, ur: NEGATIVE mg/dL
Leukocytes,Ua: NEGATIVE
Nitrite: NEGATIVE
Protein, ur: NEGATIVE mg/dL
Specific Gravity, Urine: 1.015 (ref 1.005–1.030)
pH: 5 (ref 5.0–8.0)

## 2023-07-24 LAB — SARS CORONAVIRUS 2 BY RT PCR: SARS Coronavirus 2 by RT PCR: POSITIVE — AB

## 2023-07-24 LAB — COMPREHENSIVE METABOLIC PANEL
ALT: 98 U/L — ABNORMAL HIGH (ref 0–44)
AST: 78 U/L — ABNORMAL HIGH (ref 15–41)
Albumin: 3 g/dL — ABNORMAL LOW (ref 3.5–5.0)
Alkaline Phosphatase: 293 U/L — ABNORMAL HIGH (ref 38–126)
Anion gap: 9 (ref 5–15)
BUN: 19 mg/dL (ref 8–23)
CO2: 25 mmol/L (ref 22–32)
Calcium: 8.5 mg/dL — ABNORMAL LOW (ref 8.9–10.3)
Chloride: 100 mmol/L (ref 98–111)
Creatinine, Ser: 1.65 mg/dL — ABNORMAL HIGH (ref 0.44–1.00)
GFR, Estimated: 33 mL/min — ABNORMAL LOW (ref >60)
Glucose, Bld: 112 mg/dL — ABNORMAL HIGH (ref 70–99)
Potassium: 3.1 mmol/L — ABNORMAL LOW (ref 3.5–5.1)
Sodium: 134 mmol/L — ABNORMAL LOW (ref 135–145)
Total Bilirubin: 4.4 mg/dL — ABNORMAL HIGH (ref 0.3–1.2)
Total Protein: 6.1 g/dL — ABNORMAL LOW (ref 6.5–8.1)

## 2023-07-24 LAB — HEPATITIS PANEL, ACUTE
HCV Ab: NONREACTIVE
Hep A IgM: NONREACTIVE
Hep B C IgM: NONREACTIVE
Hepatitis B Surface Ag: NONREACTIVE

## 2023-07-24 LAB — LIPASE, BLOOD: Lipase: 22 U/L (ref 11–51)

## 2023-07-24 MED ORDER — SODIUM CHLORIDE 0.9 % IV BOLUS
1000.0000 mL | Freq: Once | INTRAVENOUS | Status: AC
  Administered 2023-07-24: 1000 mL via INTRAVENOUS

## 2023-07-24 MED ORDER — ONDANSETRON HCL 4 MG/2ML IJ SOLN
4.0000 mg | Freq: Once | INTRAMUSCULAR | Status: AC
  Administered 2023-07-24: 4 mg via INTRAVENOUS
  Filled 2023-07-24: qty 2

## 2023-07-24 MED ORDER — PAXLOVID (150/100) 10 X 150 MG & 10 X 100MG PO TBPK: 2.0000 | ORAL_TABLET | Freq: Two times a day (BID) | ORAL | 0 refills | Status: AC

## 2023-07-24 MED ORDER — SODIUM CHLORIDE 0.9 % IV SOLN: INTRAVENOUS | Status: DC

## 2023-07-24 MED ORDER — MORPHINE SULFATE (PF) 4 MG/ML IV SOLN
4.0000 mg | Freq: Once | INTRAVENOUS | Status: AC
  Administered 2023-07-24: 4 mg via INTRAVENOUS
  Filled 2023-07-24: qty 1

## 2023-07-24 NOTE — ED Notes (Signed)
Patient verbalizes understanding of discharge instructions. Opportunity for questioning and answers were provided. Armband removed by staff, pt discharged from ED. Pt taken to ED waiting room via wheel chair.  

## 2023-07-24 NOTE — Discharge Instructions (Addendum)
Stop taking your mirabegron and your crestor while taking the paxlovid for your covid infection.  You should not take any oxycodone while taking this medication.  Your liver enzymes were elevated today.  Follow up with your primary care doctor and your GI doctor to be rechecked.  Return to the ED for worsening symptoms

## 2023-07-24 NOTE — ED Notes (Signed)
PT ambulatory, PT walked to the bathroom.

## 2023-07-24 NOTE — ED Triage Notes (Signed)
Pt presents with lower abdominal pain and decreased urinary frequency and dark urine for 2 days.  Has had some chills and sweating as well.  Went to [facility] walk in clinic and was sent here for eval.

## 2023-07-24 NOTE — ED Provider Notes (Signed)
[place] EMERGENCY DEPARTMENT AT [facility] Provider Note   CSN: [id number] Arrival date & time: 07/24/23  1731     History  Chief Complaint  Patient presents with   Abdominal Pain    [place] is a 71 y.o. female.   Abdominal Pain  Patient does have history of hypertension chronic kidney disease, hypercholesterolemia, gallstones, pleural effusion  Patient presents ED with complaints of abdominal pain.  Patient states she has been having pain in her upper abdomen since Thursday.  She has had some decreased urinary output.  She often has trouble with constipation but this is no different than usual.  She has not had any vomiting or diarrhea.  Patient has had an occasional cough.  She has had some nausea and chills.  The patient has not been eating as well the last couple days.  Patient went to the [facility] walk-in clinic and was instructed to come to the ED for further evaluation.  Home Medications Prior to Admission medications   Medication Sig Start Date End Date Taking? Authorizing Provider  nirmatrelvir & ritonavir (PAXLOVID, 150/100,) 10 x 150 MG & 10 x 100MG  TBPK Take 2 tablets by mouth 2 (two) times daily for 5 days. 07/24/23 07/29/23 Yes Linwood Dibbles, MD  aspirin EC 81 MG tablet Take 81 mg by mouth at bedtime.    [provider]  atenolol (TENORMIN) 50 MG tablet Take 50 mg by mouth daily. 09/18/19   [provider]  Calcium Carbonate-Vitamin D (CALCIUM-D PO) Take 1 tablet by mouth 2 (two) times daily.    [provider]  cefdinir (OMNICEF) 300 MG capsule Take 1 capsule (300 mg total) by mouth 2 (two) times daily. 10/10/22   Meredeth Ide, MD  cetirizine (ZYRTEC) 10 MG tablet Take 10 mg by mouth daily.    [provider]  citalopram (CELEXA) 40 MG tablet Take 40 mg by mouth daily. 02/23/18   [provider]  DENTA 5000 PLUS 1.1 % CREA dental cream Place 1 application onto teeth 2 (two) times daily. Do not eat, drink or  rinse for 30 minutes after use. 08/12/19   [provider]  diclofenac Sodium (VOLTAREN) 1 % GEL Apply 2 g topically 4 (four) times daily as needed (pain). 08/13/22   [provider]  furosemide (LASIX) 20 MG tablet 20 mg daily as needed for fluid.    [provider]  ipratropium-albuterol (DUONEB) 0.5-2.5 (3) MG/3ML SOLN Inhale 3 mLs into the lungs every 6 (six) hours as needed (shortness of breath). 12/25/19   [provider]  levothyroxine (SYNTHROID, LEVOTHROID) 50 MCG tablet Take 50 mcg by mouth at bedtime. 02/15/18   [provider]  lisinopril (PRINIVIL,ZESTRIL) 5 MG tablet Take 5 mg by mouth daily. 02/09/18   [provider]  mirabegron ER (MYRBETRIQ) 50 MG TB24 tablet Take 50 mg by mouth daily.    [provider]  Multiple Vitamin (MULTIVITAMIN WITH MINERALS) TABS tablet Take 1 tablet by mouth daily.     [provider]  Multiple Vitamins-Minerals (HAIR/SKIN/NAILS/BIOTIN) TABS Take 1 tablet by mouth daily.     [provider]  pantoprazole (PROTONIX) 40 MG tablet Take 40 mg by mouth 2 (two) times daily. 02/11/18   [provider]  polyvinyl alcohol (ARTIFICIAL TEARS) 1.4 % ophthalmic solution Place 1 drop into both eyes 2 (two) times daily as needed for dry eyes.    [provider]  pregabalin (LYRICA) 150 MG capsule Take 150  mg by mouth 2 (two) times daily.     [provider]  rosuvastatin (CRESTOR) 10 MG tablet Take 10 mg by mouth at bedtime.  02/15/18   [provider]  tolterodine (DETROL LA) 2 MG 24 hr capsule Take 2 mg by mouth daily.    [provider]      Allergies    Sulfamethazine, Gatifloxacin, and Sulfa antibiotics    Review of Systems   Review of Systems  Gastrointestinal:  Positive for abdominal pain.    Physical Exam Updated Vital Signs BP 114/64   Pulse 72   Temp 98.3 F (36.8 C) (Oral)   Resp 20   Ht 1.575 m (5\' 2" )   Wt 82.1 kg   LMP   (LMP Unknown)   SpO2 97%   BMI 33.10 kg/m  Physical Exam Vitals and nursing note reviewed.  Constitutional:      General: She is not in acute distress.    Appearance: She is well-developed.  HENT:     Head: Normocephalic and atraumatic.     Right Ear: External ear normal.     Left Ear: External ear normal.  Eyes:     General: No scleral icterus.       Right eye: No discharge.        Left eye: No discharge.     Conjunctiva/sclera: Conjunctivae normal.  Neck:     Trachea: No tracheal deviation.  Cardiovascular:     Rate and Rhythm: Normal rate and regular rhythm.  Pulmonary:     Effort: Pulmonary effort is normal. No respiratory distress.     Breath sounds: Normal breath sounds. No stridor. No wheezing or rales.  Abdominal:     General: Bowel sounds are normal. There is no distension.     Palpations: Abdomen is soft.     Tenderness: There is abdominal tenderness in the epigastric area. There is no guarding or rebound.  Musculoskeletal:        General: No tenderness or deformity.     Cervical back: Neck supple.  Skin:    General: Skin is warm and dry.     Findings: No rash.  Neurological:     General: No focal deficit present.     Mental Status: She is alert.     Cranial Nerves: No cranial nerve deficit, dysarthria or facial asymmetry.     Sensory: No sensory deficit.     Motor: No abnormal muscle tone or seizure activity.     Coordination: Coordination normal.  Psychiatric:        Mood and Affect: Mood normal.     ED Results / Procedures / Treatments   Labs (all labs ordered are listed, but only abnormal results are displayed) Labs Reviewed  SARS CORONAVIRUS 2 BY RT PCR - Abnormal; Notable for the following components:      Result Value   SARS Coronavirus 2 by RT PCR POSITIVE (*)    All other components within normal limits  COMPREHENSIVE METABOLIC PANEL - Abnormal; Notable for the following components:   Sodium 134 (*)    Potassium 3.1 (*)    Glucose, Bld 112  (*)    Creatinine, Ser 1.65 (*)    Calcium 8.5 (*)    Total Protein 6.1 (*)    Albumin 3.0 (*)    AST 78 (*)    ALT 98 (*)    Alkaline Phosphatase 293 (*)    Total Bilirubin 4.4 (*)    GFR, Estimated 33 (*)  All other components within normal limits  CBC - Abnormal; Notable for the following components:   Hemoglobin 11.6 (*)    HCT 35.4 (*)    Platelets 98 (*)    All other components within normal limits  URINALYSIS, ROUTINE W REFLEX MICROSCOPIC - Abnormal; Notable for the following components:   Color, Urine AMBER (*)    Bilirubin Urine MODERATE (*)    Bacteria, UA RARE (*)    All other components within normal limits  LIPASE, BLOOD  HEPATITIS PANEL, ACUTE    EKG EKG Interpretation Date/Time:  Saturday July 24 2023 19:55:57 EDT Ventricular Rate:  65 PR Interval:  207 QRS Duration:  128 QT Interval:  477 QTC Calculation: 496 R Axis:   -26  Text Interpretation: Sinus rhythm Ventricular trigeminy , new since last tracing Left bundle branch block Confirmed by Linwood Dibbles [id number]) on 07/24/2023 8:05:13 PM  Radiology US Abdomen Limited RUQ (LIVER/GB)  Result Date: 07/24/2023 CLINICAL DATA:  Upper abdominal pain EXAM: ULTRASOUND ABDOMEN LIMITED RIGHT UPPER QUADRANT COMPARISON:  None Available. FINDINGS: Gallbladder: No gallstones or wall thickening visualized. No sonographic Murphy sign noted by sonographer. Common bile duct: Diameter: Upper limits normal in diameter at 7 mm. No visible ductal stones. The distal duct is obscured by overlying bowel gas. Liver: No focal lesion identified. Within normal limits in parenchymal echogenicity. Portal vein is patent on color Doppler imaging with normal direction of blood flow towards the liver. Other: None. IMPRESSION: No evidence of cholelithiasis or acute cholecystitis. Electronically Signed   By: Charlett Nose M.D.   On: 07/24/2023 20:40   DG Chest 2 View  Result Date: 07/24/2023 CLINICAL DATA:  Abdominal pain and cough.  Chills and  sweating. EXAM: CHEST - 2 VIEW COMPARISON:  Chest radiograph dated 10/07/2022. FINDINGS: No focal consolidation, pleural effusion, pneumothorax. Stable mild cardiomegaly. No acute osseous pathology. Osteopenia with degenerative changes of the spine and shoulders. Thoracic posterior fusion hardware. IMPRESSION: 1. No acute cardiopulmonary process. 2. Mild cardiomegaly. Electronically Signed   By: Elgie Collard M.D.   On: 07/24/2023 19:30    Procedures Procedures    Medications Ordered in ED Medications  sodium chloride 0.9 % bolus 1,000 mL (0 mLs Intravenous Stopped 07/24/23 2153)    And  0.9 %  sodium chloride infusion ( Intravenous New Bag/Given 07/24/23 1956)  morphine (PF) 4 MG/ML injection 4 mg (4 mg Intravenous Given 07/24/23 1950)  ondansetron (ZOFRAN) injection 4 mg (4 mg Intravenous Given 07/24/23 1946)    ED Course/ Medical Decision Making/ A&P Clinical Course as of 07/24/23 2206  Sat Jul 24, 2023  1954 CBC(!) CBC normal [JK]  1954 Chest x-ray without acute abnormalities [JK]  1954 Comprehensive metabolic panel(!) Metabolic panel notable for decreased potassium elevated creatinine.  LFTs elevated [JK]  2054 Covid test positive [JK]  2055 Ultrasound shows no evidence of cholelithiasis or acute cholecystitis [JK]    Clinical Course User Index [JK] Linwood Dibbles, MD                                 Medical Decision Making Parental diagnosis includes but not limited to pancreatitis, cholecystitis hepatitis, pneumonia, viral infection  Problems Addressed: COVID: acute illness or injury that poses a threat to life or bodily functions Elevated liver enzymes: acute illness or injury that poses a threat to life or bodily functions  Amount and/or Complexity of Data Reviewed Labs: ordered. Decision-making details documented in  ED Course. Radiology: ordered.  Risk Prescription drug management.   Patient presented with complaints of pain primarily in the upper abdomen.  Patient  also admits to some coughing recently.  Patient noted to have mild tenderness in the epigastric region.  Initial laboratory test did not show evidence of pancreatitis however she did have elevations in her liver enzymes.  AST ALT were increased, alk phos and bilirubin were increased.  Ultrasound was performed however does not show any signs of cholecystitis or cholelithiasis.  Patient does not have any significant tenderness on exam.  Will add on viral hepatitis panel.  Patient does not have any evidence of pneumonia and has normal oxygen saturation however she is positive for COVID.  This certainly could be causing some of her symptoms.  Patient will require follow-up on her liver enzymes.  Discussed close follow-up with her primary care doctor and gastroenterologist.  Patient to return for worsening symptoms fevers chills        Final Clinical Impression(s) / ED Diagnoses Final diagnoses:  Elevated liver enzymes  COVID    Rx / DC Orders ED Discharge Orders          Ordered    nirmatrelvir & ritonavir (PAXLOVID, 150/100,) 10 x 150 MG & 10 x 100MG  TBPK  2 times daily        07/24/23 2202              Linwood Dibbles, MD 07/24/23 2206

## 2023-09-03 ENCOUNTER — Other Ambulatory Visit: Payer: Self-pay

## 2023-09-03 ENCOUNTER — Inpatient Hospital Stay (HOSPITAL_COMMUNITY)
Admission: EM | Admit: 2023-09-03 | Discharge: 2023-09-07 | DRG: 872 | Disposition: A | Discharge status: Home / Self Care | Payer: 59 | Attending: Internal Medicine | Admitting: Internal Medicine

## 2023-09-03 ENCOUNTER — Encounter (HOSPITAL_COMMUNITY): Payer: Self-pay | Admitting: Emergency Medicine

## 2023-09-03 ENCOUNTER — Other Ambulatory Visit (HOSPITAL_COMMUNITY): Payer: 59

## 2023-09-03 ENCOUNTER — Inpatient Hospital Stay (HOSPITAL_COMMUNITY): Payer: 59

## 2023-09-03 ENCOUNTER — Emergency Department (HOSPITAL_COMMUNITY): Payer: 59

## 2023-09-03 DIAGNOSIS — Z6833 Body mass index (BMI) 33.0-33.9, adult: Secondary | ICD-10-CM

## 2023-09-03 DIAGNOSIS — E7800 Pure hypercholesterolemia, unspecified: Secondary | ICD-10-CM | POA: Diagnosis present

## 2023-09-03 DIAGNOSIS — K828 Other specified diseases of gallbladder: Secondary | ICD-10-CM | POA: Diagnosis present

## 2023-09-03 DIAGNOSIS — I10 Essential (primary) hypertension: Secondary | ICD-10-CM | POA: Diagnosis present

## 2023-09-03 DIAGNOSIS — K219 Gastro-esophageal reflux disease without esophagitis: Secondary | ICD-10-CM | POA: Diagnosis present

## 2023-09-03 DIAGNOSIS — Z79890 Hormone replacement therapy (postmenopausal): Secondary | ICD-10-CM

## 2023-09-03 DIAGNOSIS — B9620 Unspecified Escherichia coli [E. coli] as the cause of diseases classified elsewhere: Secondary | ICD-10-CM | POA: Diagnosis present

## 2023-09-03 DIAGNOSIS — R17 Unspecified jaundice: Principal | ICD-10-CM | POA: Diagnosis present

## 2023-09-03 DIAGNOSIS — Z825 Family history of asthma and other chronic lower respiratory diseases: Secondary | ICD-10-CM

## 2023-09-03 DIAGNOSIS — Z823 Family history of stroke: Secondary | ICD-10-CM | POA: Diagnosis not present

## 2023-09-03 DIAGNOSIS — Z79899 Other long term (current) drug therapy: Secondary | ICD-10-CM | POA: Diagnosis not present

## 2023-09-03 DIAGNOSIS — R1013 Epigastric pain: Secondary | ICD-10-CM

## 2023-09-03 DIAGNOSIS — E669 Obesity, unspecified: Secondary | ICD-10-CM | POA: Diagnosis present

## 2023-09-03 DIAGNOSIS — N131 Hydronephrosis with ureteral stricture, not elsewhere classified: Secondary | ICD-10-CM | POA: Diagnosis present

## 2023-09-03 DIAGNOSIS — R6520 Severe sepsis without septic shock: Secondary | ICD-10-CM | POA: Diagnosis present

## 2023-09-03 DIAGNOSIS — E66811 Obesity, class 1: Secondary | ICD-10-CM | POA: Diagnosis present

## 2023-09-03 DIAGNOSIS — N3281 Overactive bladder: Secondary | ICD-10-CM | POA: Diagnosis present

## 2023-09-03 DIAGNOSIS — N179 Acute kidney failure, unspecified: Secondary | ICD-10-CM | POA: Diagnosis present

## 2023-09-03 DIAGNOSIS — E039 Hypothyroidism, unspecified: Secondary | ICD-10-CM | POA: Diagnosis present

## 2023-09-03 DIAGNOSIS — A4150 Gram-negative sepsis, unspecified: Principal | ICD-10-CM | POA: Diagnosis present

## 2023-09-03 DIAGNOSIS — S36119A Unspecified injury of liver, initial encounter: Secondary | ICD-10-CM

## 2023-09-03 DIAGNOSIS — D649 Anemia, unspecified: Secondary | ICD-10-CM | POA: Diagnosis present

## 2023-09-03 DIAGNOSIS — K8050 Calculus of bile duct without cholangitis or cholecystitis without obstruction: Secondary | ICD-10-CM | POA: Diagnosis present

## 2023-09-03 DIAGNOSIS — R001 Bradycardia, unspecified: Secondary | ICD-10-CM | POA: Diagnosis present

## 2023-09-03 DIAGNOSIS — F32A Depression, unspecified: Secondary | ICD-10-CM | POA: Diagnosis present

## 2023-09-03 DIAGNOSIS — E876 Hypokalemia: Secondary | ICD-10-CM | POA: Diagnosis not present

## 2023-09-03 DIAGNOSIS — I959 Hypotension, unspecified: Secondary | ICD-10-CM | POA: Diagnosis not present

## 2023-09-03 DIAGNOSIS — Z8249 Family history of ischemic heart disease and other diseases of the circulatory system: Secondary | ICD-10-CM

## 2023-09-03 DIAGNOSIS — G8929 Other chronic pain: Secondary | ICD-10-CM | POA: Diagnosis present

## 2023-09-03 LAB — I-STAT CG4 LACTIC ACID, ED: Lactic Acid, Venous: 0.6 mmol/L (ref 0.5–1.9)

## 2023-09-03 LAB — CBC WITH DIFFERENTIAL/PLATELET
Abs Immature Granulocytes: 0.14 10*3/uL — ABNORMAL HIGH (ref 0.00–0.07)
Basophils Absolute: 0 10*3/uL (ref 0.0–0.1)
Basophils Relative: 0 %
Eosinophils Absolute: 0 10*3/uL (ref 0.0–0.5)
Eosinophils Relative: 0 %
HCT: 32.9 % — ABNORMAL LOW (ref 36.0–46.0)
Hemoglobin: 10.6 g/dL — ABNORMAL LOW (ref 12.0–15.0)
Immature Granulocytes: 1 %
Lymphocytes Relative: 3 %
Lymphs Abs: 0.8 10*3/uL (ref 0.7–4.0)
MCH: 28.3 pg (ref 26.0–34.0)
MCHC: 32.2 g/dL (ref 30.0–36.0)
MCV: 88 fL (ref 80.0–100.0)
Monocytes Absolute: 1.1 10*3/uL — ABNORMAL HIGH (ref 0.1–1.0)
Monocytes Relative: 4 %
Neutro Abs: 21.7 10*3/uL — ABNORMAL HIGH (ref 1.7–7.7)
Neutrophils Relative %: 92 %
Platelets: 211 10*3/uL (ref 150–400)
RBC: 3.74 MIL/uL — ABNORMAL LOW (ref 3.87–5.11)
RDW: 17.3 % — ABNORMAL HIGH (ref 11.5–15.5)
WBC: 23.8 10*3/uL — ABNORMAL HIGH (ref 4.0–10.5)
nRBC: 0 % (ref 0.0–0.2)

## 2023-09-03 LAB — COMPREHENSIVE METABOLIC PANEL
ALT: 92 U/L — ABNORMAL HIGH (ref 0–44)
AST: 164 U/L — ABNORMAL HIGH (ref 15–41)
Albumin: 2.4 g/dL — ABNORMAL LOW (ref 3.5–5.0)
Alkaline Phosphatase: 858 U/L — ABNORMAL HIGH (ref 38–126)
Anion gap: 11 (ref 5–15)
BUN: 18 mg/dL (ref 8–23)
CO2: 23 mmol/L (ref 22–32)
Calcium: 8.5 mg/dL — ABNORMAL LOW (ref 8.9–10.3)
Chloride: 98 mmol/L (ref 98–111)
Creatinine, Ser: 1.54 mg/dL — ABNORMAL HIGH (ref 0.44–1.00)
GFR, Estimated: 36 mL/min — ABNORMAL LOW (ref >60)
Glucose, Bld: 145 mg/dL — ABNORMAL HIGH (ref 70–99)
Potassium: 3.8 mmol/L (ref 3.5–5.1)
Sodium: 132 mmol/L — ABNORMAL LOW (ref 135–145)
Total Bilirubin: 9.9 mg/dL — ABNORMAL HIGH (ref 0.3–1.2)
Total Protein: 6.5 g/dL (ref 6.5–8.1)

## 2023-09-03 LAB — URINALYSIS, ROUTINE W REFLEX MICROSCOPIC
Glucose, UA: NEGATIVE mg/dL
Hgb urine dipstick: NEGATIVE
Ketones, ur: NEGATIVE mg/dL
Leukocytes,Ua: NEGATIVE
Nitrite: NEGATIVE
Protein, ur: NEGATIVE mg/dL
Specific Gravity, Urine: 1.014 (ref 1.005–1.030)
pH: 5 (ref 5.0–8.0)

## 2023-09-03 LAB — LIPID PANEL
Cholesterol: 201 mg/dL — ABNORMAL HIGH (ref 0–200)
HDL: 23 mg/dL — ABNORMAL LOW (ref >40)
LDL Cholesterol: 164 mg/dL — ABNORMAL HIGH (ref 0–99)
Total CHOL/HDL Ratio: 8.7 {ratio}
Triglycerides: 71 mg/dL (ref <150)
VLDL: 14 mg/dL (ref 0–40)

## 2023-09-03 LAB — LIPASE, BLOOD: Lipase: 22 U/L (ref 11–51)

## 2023-09-03 MED ORDER — PIPERACILLIN-TAZOBACTAM 3.375 G IVPB
3.3750 g | Freq: Three times a day (TID) | INTRAVENOUS | Status: DC
  Administered 2023-09-04 (×2): 3.375 g via INTRAVENOUS
  Filled 2023-09-03 (×2): qty 50

## 2023-09-03 MED ORDER — HYDROMORPHONE HCL 1 MG/ML IJ SOLN
0.5000 mg | INTRAMUSCULAR | Status: DC | PRN
  Administered 2023-09-03 – 2023-09-05 (×9): 0.5 mg via INTRAVENOUS
  Filled 2023-09-03 (×9): qty 0.5

## 2023-09-03 MED ORDER — LACTATED RINGERS IV BOLUS
1000.0000 mL | Freq: Once | INTRAVENOUS | Status: AC
  Administered 2023-09-03: 1000 mL via INTRAVENOUS

## 2023-09-03 MED ORDER — SODIUM CHLORIDE 0.9 % IV SOLN
2.0000 g | Freq: Once | INTRAVENOUS | Status: AC
  Administered 2023-09-03: 2 g via INTRAVENOUS
  Filled 2023-09-03: qty 12.5

## 2023-09-03 MED ORDER — GADOBUTROL 1 MMOL/ML IV SOLN
10.0000 mL | Freq: Once | INTRAVENOUS | Status: AC | PRN
  Administered 2023-09-03: 10 mL via INTRAVENOUS

## 2023-09-03 MED ORDER — PIPERACILLIN-TAZOBACTAM 3.375 G IVPB: 3.3750 g | Freq: Three times a day (TID) | INTRAVENOUS | Status: DC

## 2023-09-03 MED ORDER — LACTATED RINGERS IV SOLN: INTRAVENOUS | Status: DC

## 2023-09-03 MED ORDER — NALOXONE HCL 0.4 MG/ML IJ SOLN: 0.4000 mg | INTRAMUSCULAR | Status: DC | PRN

## 2023-09-03 MED ORDER — METRONIDAZOLE 500 MG/100ML IV SOLN
500.0000 mg | Freq: Once | INTRAVENOUS | Status: AC
  Administered 2023-09-03: 500 mg via INTRAVENOUS
  Filled 2023-09-03: qty 100

## 2023-09-03 MED ORDER — PANTOPRAZOLE SODIUM 40 MG IV SOLR
40.0000 mg | INTRAVENOUS | Status: DC
  Administered 2023-09-03 – 2023-09-05 (×3): 40 mg via INTRAVENOUS
  Filled 2023-09-03 (×3): qty 10

## 2023-09-03 NOTE — Progress Notes (Signed)
Pharmacy Antibiotic Note  Suzanne Harrison is a 72 y.o. female admitted on 09/03/2023 with  intra-abdominal infection .  Pharmacy has been consulted for zosyn dosing.   Presented with abdominal/back pain and jaundice x4 weeks PMH cholecystitis status post percutaneous Coley cystostomy tube in 2020, ischemic colitis  Scr 1.54, alkphos 858, AST/ALT 164/92, tbili 9.9, elevated WBC 23.8, elevated  Plan: Zosyn 3.375g IV q8h (4 hour infusion) >Monitor CBC, renal function, cultures, and signs of clinical improvement >Follow up results of MRI abdomen  Height: 5\' 2"  (157.5 cm) Weight: 82.1 kg (181 lb) IBW/kg (Calculated) : 50.1  Temp (24hrs), Avg:98 F (36.7 C), Min:97.9 F (36.6 C), Max:98 F (36.7 C)  Recent Labs  Lab 09/03/23 1320 09/03/23 1555  WBC 23.8*  --   CREATININE 1.54*  --   LATICACIDVEN  --  0.6    Estimated Creatinine Clearance: 33.8 mL/min (A) (by C-G formula based on SCr of 1.54 mg/dL (H)).    Allergies  Allergen Reactions   Chlorthalidone Other (See Comments)    hyponatremia   Sulfamethazine Other (See Comments)   Sulfur Other (See Comments)    Client doesn't remember type of reaction   Gatifloxacin Other (See Comments) and Rash    Other reaction(s): Myalgias (Muscle Pain)  Rash in mouth   Sulfa Antibiotics Other (See Comments)    GI Intolerance    Antimicrobials this admission: 10/4 zosyn >>  10/4 metronidazole x1 10/4 cefepime x1  Microbiology results: 10/4 BCx: sent   Thank you for allowing pharmacy to be a part of this patient's care.  Stephenie Acres, PharmD PGY1 Pharmacy Resident 09/03/2023 5:58 PM

## 2023-09-03 NOTE — Progress Notes (Signed)
[facility] night cross cover note:   I was notified by RN that the patient is complaining of her chronic low back pain, for which she takes prn oxycodone as an outpatient.  However, she is currently in eligible for her home prn oxycodone as she is NPO.  I subsequently placed order for as needed IV Dilaudid.     Newton Pigg, DO Hospitalist

## 2023-09-03 NOTE — H&P (Addendum)
History and Physical    Suzanne Harrison QQV:[id number] DOB: [birth year] DOA: 09/03/2023  PCP: Maurice Small, MD (Inactive)  Patient coming from: home  I have personally briefly reviewed patient's old medical records in [facility]  Chief Complaint: abdominal pain, jaundice  HPI: Suzanne Harrison is Suzanne Harrison 72 y.o. female with medical history significant of CKD, gallstones, HTN, dyslipidemia, ischemic colitis, cholecystitis s/p perc cholecystostomy tube (10/04/2019) and other medical issues who presented with abdominal pain.  She notes abdominal pain for about 1 month in addition to back pain.  She notes her back pain comes and goes, radiates from sided to side.  She describes eh abdominal pain similarly.  Radiating and intermittent.  He husband notes the episodes of pain are extreme and debilitating.  She'll drench the sheets with sweat.  Sometimes the episodes will last 2 days.  Denies fevers, cough, cold, chest pain, shortness of breath.  She notes chills.  Denies smoking, drinking.  She saw her PCP today and was directed to the ED in the setting of her jaundice, fatigue, abdominal pain and hypotension with concern for sepsis.   ED Course: Labs, imaging, IVF.  Review of Systems: As per HPI otherwise all other systems reviewed and are negative.  Past Medical History:  Diagnosis Date   Arthritis    CKD (chronic kidney disease)    Gallstones 10/2019   Hypercholesteremia    Hypertension    Loculated pleural effusion 12/2019   right    Past Surgical History:  Procedure Laterality Date   BACK SURGERY     BILIARY DRAINAGE  10/03/2019   ATTEMPTED LAPAROSCOPIC CHOLECYSTECTOMY WITH PLACEMENT OF DRAIN (N/Suzanne Harrison Abdomen)   CHOLECYSTECTOMY N/Suzanne Harrison 10/03/2019   Procedure: ATTEMPTED LAPAROSCOPIC CHOLECYSTECTOMY WITH PLACEMENT OF DRAIN;  Surgeon: Emelia Loron, MD;  Location: [facility] OR;  Service: General;  Laterality: N/Suzanne Harrison;   IR RADIOLOGIST EVAL & MGMT  11/16/2019   IR RADIOLOGIST EVAL & MGMT   12/13/2019   IR THORACENTESIS ASP PLEURAL SPACE W/IMG GUIDE  12/18/2019   REPLACEMENT TOTAL HIP W/  RESURFACING IMPLANTS      Social History  reports that she has never smoked. She has never used smokeless tobacco. She reports that she does not drink alcohol and does not use drugs.  Allergies  Allergen Reactions   Chlorthalidone Other (See Comments)    hyponatremia   Sulfamethazine Other (See Comments)   Sulfur Other (See Comments)    Client doesn't remember type of reaction   Gatifloxacin Other (See Comments) and Rash    Other reaction(s): Myalgias (Muscle Pain)  Rash in mouth   Sulfa Antibiotics Other (See Comments)    GI Intolerance    Family History  Problem Relation Age of Onset   Stroke Mother    COPD Mother    Heart disease Father    Sleep apnea Brother    Prior to Admission medications   Medication Sig Start Date End Date Taking? Authorizing Provider  albuterol (VENTOLIN HFA) 108 (90 Base) MCG/ACT inhaler Inhale 1 puff into the lungs every 4 (four) hours as needed for wheezing or shortness of breath.    [provider]  aspirin EC 81 MG tablet Take 81 mg by mouth at bedtime.    [provider]  atenolol (TENORMIN) 50 MG tablet Take 50 mg by mouth daily. 09/18/19   [provider]  Biotin 10 MG TABS Take 10 mg by mouth daily.    [provider]  buPROPion (WELLBUTRIN XL) 150 MG 24  hr tablet Take 150 mg by mouth daily. 02/23/23   [provider]  Calcium Carbonate-Vitamin D (CALCIUM-D PO) Take 1 tablet by mouth 2 (two) times daily.    [provider]  cefdinir (OMNICEF) 300 MG capsule Take 1 capsule (300 mg total) by mouth 2 (two) times daily. 10/10/22   Meredeth Ide, MD  cetirizine (ZYRTEC) 10 MG tablet Take 10 mg by mouth daily.    [provider]  citalopram (CELEXA) 40 MG tablet Take 40 mg by mouth daily. 02/23/18   [provider]  DENTA 5000 PLUS 1.1 % CREA dental cream Place 1 application onto  teeth 2 (two) times daily. Do not eat, drink or rinse for 30 minutes after use. 08/12/19   [provider]  diclofenac Sodium (VOLTAREN) 1 % GEL Apply 2 g topically 4 (four) times daily as needed (pain). 08/13/22   [provider]  furosemide (LASIX) 20 MG tablet 20 mg daily as needed for fluid.    [provider]  ipratropium-albuterol (DUONEB) 0.5-2.5 (3) MG/3ML SOLN Inhale 3 mLs into the lungs every 6 (six) hours as needed (shortness of breath). 12/25/19   [provider]  levothyroxine (SYNTHROID, LEVOTHROID) 50 MCG tablet Take 50 mcg by mouth at bedtime. 02/15/18   [provider]  lisinopril (PRINIVIL,ZESTRIL) 5 MG tablet Take 5 mg by mouth daily. 02/09/18   [provider]  mirabegron ER (MYRBETRIQ) 50 MG TB24 tablet Take 50 mg by mouth daily.    [provider]  Multiple Vitamin (MULTIVITAMIN WITH MINERALS) TABS tablet Take 1 tablet by mouth daily.     [provider]  Multiple Vitamins-Minerals (HAIR/SKIN/NAILS/BIOTIN) TABS Take 1 tablet by mouth daily.     [provider]  oxyCODONE-acetaminophen (PERCOCET) 10-325 MG tablet Take 1 tablet by mouth every 6 (six) hours as needed for pain. 03/01/13   [provider]  pantoprazole (PROTONIX) 40 MG tablet Take 40 mg by mouth 2 (two) times daily. 02/11/18   [provider]  polyvinyl alcohol (ARTIFICIAL TEARS) 1.4 % ophthalmic solution Place 1 drop into both eyes 2 (two) times daily as needed for dry eyes.    [provider]  pregabalin (LYRICA) 150 MG capsule Take 150 mg by mouth 2 (two) times daily.     [provider]  rosuvastatin (CRESTOR) 10 MG tablet Take 10 mg by mouth at bedtime.  02/15/18   [provider]  tolterodine (DETROL LA) 2 MG 24 hr capsule Take 2 mg by mouth daily.    [provider]    Physical Exam: Vitals:   09/03/23 1530 09/03/23 1531 09/03/23 1715 09/03/23 1745  BP: (!) 109/53  134/68 (!) 121/55   Pulse: (!) 57  62 (!) 58  Resp: 13  18 11   Temp:  97.9 F (36.6 C)    SpO2: 100%  100% 100%  Weight:      Height:        Constitutional: jaundiced Vitals:   09/03/23 1530 09/03/23 1531 09/03/23 1715 09/03/23 1745  BP: (!) 109/53  134/68 (!) 121/55  Pulse: (!) 57  62 (!) 58  Resp: 13  18 11   Temp:  97.9 F (36.6 C)    SpO2: 100%  100% 100%  Weight:      Height:       Eyes: PERRL, icteric  ENMT: Mucous membranes are moist. Neck: normal, supple Respiratory:unlabored Cardiovascular: RRR Abdomen: soft, non distended, not appreciable tender at this point  Musculoskeletal: no  clubbing / cyanosis. No joint deformity upper and lower extremities. Good ROM, no contractures. Normal muscle tone.  Skin: jaundice Neurologic: CN 2-12 grossly intact. Moving all extremities.  Psychiatric: Normal judgment and insight. Alert and oriented x 3. Normal mood.   Labs on Admission: I have personally reviewed following labs and imaging studies  CBC: Recent Labs  Lab 09/03/23 1320  WBC 23.8*  NEUTROABS 21.7*  HGB 10.6*  HCT 32.9*  MCV 88.0  PLT 211    Basic Metabolic Panel: Recent Labs  Lab 09/03/23 1320  NA 132*  K 3.8  CL 98  CO2 23  GLUCOSE 145*  BUN 18  CREATININE 1.54*  CALCIUM 8.5*    GFR: Estimated Creatinine Clearance: 33.8 mL/min (Felice Hope) (by C-G formula based on SCr of 1.54 mg/dL (H)).  Liver Function Tests: Recent Labs  Lab 09/03/23 1320  AST 164*  ALT 92*  ALKPHOS 858*  BILITOT 9.9*  PROT 6.5  ALBUMIN 2.4*    Urine analysis:    Component Value Date/Time   COLORURINE AMBER (Fedrick Cefalu) 09/03/2023 1715   APPEARANCEUR HAZY (Perseus Westall) 09/03/2023 1715   LABSPEC 1.014 09/03/2023 1715   PHURINE 5.0 09/03/2023 1715   GLUCOSEU NEGATIVE 09/03/2023 1715   HGBUR NEGATIVE 09/03/2023 1715   BILIRUBINUR SMALL (Eusebio Blazejewski) 09/03/2023 1715   KETONESUR NEGATIVE 09/03/2023 1715   PROTEINUR NEGATIVE 09/03/2023 1715   NITRITE NEGATIVE 09/03/2023 1715   LEUKOCYTESUR NEGATIVE 09/03/2023 1715     Radiological Exams on Admission: US Abdomen Limited RUQ (LIVER/GB)  Result Date: 09/03/2023 CLINICAL DATA:  Right upper quadrant pain.  Mass lesion. EXAM: ULTRASOUND ABDOMEN LIMITED RIGHT UPPER QUADRANT COMPARISON:  Ultrasound 07/24/2023.  CT scan 10/06/2022 FINDINGS: Gallbladder: Distended gallbladder. Slight wall thickening measured up to 6 mm. No adjacent fluid. No shadowing stones. Common bile duct: Diameter: 10 mm, upper limits of normal for patient's age. Liver: No focal lesion identified. Within normal limits in parenchymal echogenicity. Portal vein is patent on color Doppler imaging with normal direction of blood flow towards the liver. Other: Upper pole right-sided simple appearing renal cyst measuring 5.4 cm. IMPRESSION: Distended gallbladder with slight wall thickening but no stones or stranding. Please correlate with symptoms. Further workup as clinically appropriate. No biliary ductal dilatation. Common duct at the upper limits of normal for patient's age. Electronically Signed   By: Karen Kays M.D.   On: 09/03/2023 16:03    EKG: Independently reviewed. Sinus IVCD prolonged QTc  Assessment/Plan Principal Problem:   Elevated bilirubin    Assessment and Plan:  Abdominal Pain  Jaundice  Elevated Liver Enzymes History Cholecystitis s/p Perc Chole Tube (10/2019) Abdominal pain and jaundice for at least Ladarren Steiner month (of note, bili 4.4 in 07/2023 when she presented with COVID - also had abd pain at that time) Notably in 2020 she had attempted lap chole for cholecystitis (stomach and duodenum adherent to gallbladder)-> was taken to IR and had perc chole tube placed 10/04/2019 Afebrile, but elevated WBC count - denies fevers, but notes chills RUQ [place] with distended gallbladder with slight wall thickening, but no stones or stranding (no biliary ductal dilatation, common duct at the upper limit of normal for patient's age  Blood cultures pending MRCP pending Dr. Ewing Schlein has been c/s by ED  (he's on treatment team) Low threshold to discuss again with surgery Continue abx  Acute Kidney Injury Follow with IVF  Prolonged Qtc Caution with QT prolonging meds  Hypertension Hold lisinopril and lasix for now Atenolol   Depression  Wellbutrin, celexa  Hypothyroidism  Synthroid  GERD PPI   Chronic Pain Lyrica  Prn oxycodone  Overactive Bladder Detrol LA  Dyslipidemia Statin on hold with abnormal LFT's  Obesity Body mass index is 33.1 kg/m.    DVT prophylaxis: lovenox  Code Status:   Full   Family Communication:  husband  Disposition Plan:   Patient is from:  home  Anticipated DC to:  pending  Anticipated DC date:  pending  Anticipated DC barriers: Pending GI clearance, additional workup  Consults called:  GI  Admission status:  inpatient   Severity of Illness: The appropriate patient status for this patient is OBSERVATION. Observation status is judged to be reasonable and necessary in order to provide the required intensity of service to ensure the patient's safety. The patient's presenting symptoms, physical exam findings, and initial radiographic and laboratory data in the context of their medical condition is felt to place them at decreased risk for further clinical deterioration. Furthermore, it is anticipated that the patient will be medically stable for discharge from the hospital within 2 midnights of admission.     Lacretia Nicks MD [facility]  How to contact the [facility]) Attending or Consulting provider 7A - 7P or covering provider during after hours 7P -7A, for this patient?   Check the care team in [facility] and look for Kerrie Latour) attending/consulting TR[facility] provider listed and b) the [facility] team listed Log into [email or web address] and use 's universal password to access. If you do not have the password, please contact the hospital operator. Locate the [facility] provider you are looking for under [facility] and page to Vieva Brummitt number that you can be  directly reached. If you still have difficulty reaching the provider, please page the [facility] (Director on Call) for the Hospitalists listed on amion for assistance.  09/03/2023, 6:31 PM

## 2023-09-03 NOTE — ED Triage Notes (Signed)
Pt arrives via EMS from Dr. Isidore Moos. Pt feeling unwell and jaundiced for 1 month, accompanied by belly pain and jaundice.  Pt complaining of thoracic back and belly pain.  BP in 70's upon EMS arrival. Pt Aox4 at this time. Pt denies dizziness/lightheaded. Pt reports feeling "off".

## 2023-09-03 NOTE — ED Notes (Signed)
US at bedside

## 2023-09-03 NOTE — ED Notes (Signed)
ED TO INPATIENT HANDOFF REPORT  ED Nurse Name and Phone #: [phone]  S Name/Age/Gender Suzanne Harrison 72 y.o. female Room/Bed: 002C/002C  Code Status   Code Status: Prior  Home/SNF/Other Nursing Home Patient oriented to: self Is this baseline? Yes   Triage Complete: Triage complete  Chief Complaint Elevated bilirubin [R17]  Triage Note Pt arrives via EMS from Dr. Isidore Moos. Pt feeling unwell and jaundiced for 1 month, accompanied by belly pain and jaundice.  Pt complaining of thoracic back and belly pain.  BP in 70's upon EMS arrival. Pt Aox4 at this time. Pt denies dizziness/lightheaded. Pt reports feeling "off".    Allergies Allergies  Allergen Reactions   Chlorthalidone Other (See Comments)    hyponatremia   Sulfamethazine Other (See Comments)   Sulfur Other (See Comments)    Client doesn't remember type of reaction   Gatifloxacin Other (See Comments) and Rash    Other reaction(s): Myalgias (Muscle Pain)  Rash in mouth   Sulfa Antibiotics Other (See Comments)    GI Intolerance    Level of Care/Admitting Diagnosis ED Disposition     ED Disposition  Admit   Condition  --   Comment  Hospital Area: [facility] [[id number]]  Level of Care: Telemetry Medical [104]  May admit patient to [facility] or [facility] if equivalent level of care is available:: No  Covid Evaluation: Asymptomatic - no recent exposure (last 10 days) testing not required  Diagnosis: Elevated bilirubin [725366]  Admitting Physician: Zigmund Daniel [phone]  Attending Physician: Shaune Spittle, Vanice Sarah [phone]  Certification:: I certify this patient will need inpatient services for at least 2 midnights  Expected Medical Readiness: 09/08/2023          B Medical/Surgery History Past Medical History:  Diagnosis Date   Arthritis    CKD (chronic kidney disease)    Gallstones 10/2019   Hypercholesteremia    Hypertension    Loculated pleural effusion 12/2019    right   Past Surgical History:  Procedure Laterality Date   BACK SURGERY     BILIARY DRAINAGE  10/03/2019   ATTEMPTED LAPAROSCOPIC CHOLECYSTECTOMY WITH PLACEMENT OF DRAIN (N/A Abdomen)   CHOLECYSTECTOMY N/A 10/03/2019   Procedure: ATTEMPTED LAPAROSCOPIC CHOLECYSTECTOMY WITH PLACEMENT OF DRAIN;  Surgeon: Emelia Loron, MD;  Location: [facility] OR;  Service: General;  Laterality: N/A;   IR RADIOLOGIST EVAL & MGMT  11/16/2019   IR RADIOLOGIST EVAL & MGMT  12/13/2019   IR THORACENTESIS ASP PLEURAL SPACE W/IMG GUIDE  12/18/2019   REPLACEMENT TOTAL HIP W/  RESURFACING IMPLANTS       A IV Location/Drains/Wounds Patient Lines/Drains/Airways Status     Active Line/Drains/Airways     Name Placement date Placement time Site Days   Peripheral IV 09/03/23 22 G Right Forearm 09/03/23  1749  Forearm  less than 1   Closed System Drain 2 Right RUQ Other (Comment) 10 Fr. 10/04/19  1248  RUQ  1430   Incision - 3 Ports Abdomen 1: Umbilicus 2: Mid;Upper 3: Right;Lateral 10/03/19  1613  -- 1431            Intake/Output Last 24 hours  Intake/Output Summary (Last 24 hours) at 09/03/2023 1803 Last data filed at 09/03/2023 1452 Gross per 24 hour  Intake 1250 ml  Output --  Net 1250 ml    Labs/Imaging Results for orders placed or performed during the hospital encounter of 09/03/23 (from the past 48 hour(s))  Comprehensive metabolic panel  Status: Abnormal   Collection Time: 09/03/23  1:20 PM  Result Value Ref Range   Sodium 132 (L) 135 - 145 mmol/L   Potassium 3.8 3.5 - 5.1 mmol/L   Chloride 98 98 - 111 mmol/L   CO2 23 22 - 32 mmol/L   Glucose, Bld 145 (H) 70 - 99 mg/dL    Comment: Glucose reference range applies only to samples taken after fasting for at least 8 hours.   BUN 18 8 - 23 mg/dL   Creatinine, Ser 1.61 (H) 0.44 - 1.00 mg/dL   Calcium 8.5 (L) 8.9 - 10.3 mg/dL   Total Protein 6.5 6.5 - 8.1 g/dL   Albumin 2.4 (L) 3.5 - 5.0 g/dL   AST 096 (H) 15 - 41 U/L   ALT 92 (H) 0 - 44 U/L    Alkaline Phosphatase 858 (H) 38 - 126 U/L   Total Bilirubin 9.9 (H) 0.3 - 1.2 mg/dL   GFR, Estimated 36 (L) >60 mL/min    Comment: (NOTE) Calculated using the CKD-EPI Creatinine Equation (2021)    Anion gap 11 5 - 15    Comment: Performed at [facility], [address], [place], [place] [address]  Lipase, blood     Status: None   Collection Time: 09/03/23  1:20 PM  Result Value Ref Range   Lipase 22 11 - 51 U/L    Comment: Performed at [facility], [address], [place], [place] [address]  CBC with Diff     Status: Abnormal   Collection Time: 09/03/23  1:20 PM  Result Value Ref Range   WBC 23.8 (H) 4.0 - 10.5 K/uL   RBC 3.74 (L) 3.87 - 5.11 MIL/uL   Hemoglobin 10.6 (L) 12.0 - 15.0 g/dL   HCT 14.7 (L) 82.9 - 56.2 %   MCV 88.0 80.0 - 100.0 fL   MCH 28.3 26.0 - 34.0 pg   MCHC 32.2 30.0 - 36.0 g/dL   RDW 13.0 (H) 86.5 - 78.4 %   Platelets 211 150 - 400 K/uL   nRBC 0.0 0.0 - 0.2 %   Neutrophils Relative % 92 %   Neutro Abs 21.7 (H) 1.7 - 7.7 K/uL   Lymphocytes Relative 3 %   Lymphs Abs 0.8 0.7 - 4.0 K/uL   Monocytes Relative 4 %   Monocytes Absolute 1.1 (H) 0.1 - 1.0 K/uL   Eosinophils Relative 0 %   Eosinophils Absolute 0.0 0.0 - 0.5 K/uL   Basophils Relative 0 %   Basophils Absolute 0.0 0.0 - 0.1 K/uL   Immature Granulocytes 1 %   Abs Immature Granulocytes 0.14 (H) 0.00 - 0.07 K/uL    Comment: Performed at [facility], [address], [place], [place] [address]  I-Stat CG4 Lactic Acid     Status: None   Collection Time: 09/03/23  3:55 PM  Result Value Ref Range   Lactic Acid, Venous 0.6 0.5 - 1.9 mmol/L  Urinalysis, Routine w reflex microscopic -Urine, Clean Catch     Status: Abnormal   Collection Time: 09/03/23  5:15 PM  Result Value Ref Range   Color, Urine AMBER (A) YELLOW    Comment: BIOCHEMICALS MAY BE AFFECTED BY COLOR   APPearance HAZY (A) CLEAR   Specific Gravity, Urine 1.014 1.005 - 1.030   pH 5.0 5.0 - 8.0   Glucose, UA NEGATIVE  NEGATIVE mg/dL   Hgb urine dipstick NEGATIVE NEGATIVE   Bilirubin Urine SMALL (A) NEGATIVE   Ketones,  ur NEGATIVE NEGATIVE mg/dL   Protein, ur NEGATIVE NEGATIVE mg/dL   Nitrite NEGATIVE NEGATIVE   Leukocytes,Ua NEGATIVE NEGATIVE   RBC / HPF 0-5 0 - 5 RBC/hpf   WBC, UA 0-5 0 - 5 WBC/hpf   Bacteria, UA MANY (A) NONE SEEN   Squamous Epithelial / HPF 6-10 0 - 5 /HPF   Mucus PRESENT    Hyaline Casts, UA PRESENT    Granular Casts, UA PRESENT     Comment: Performed at [facility], [address], [place], [place] [address]   US Abdomen Limited RUQ (LIVER/GB)  Result Date: 09/03/2023 CLINICAL DATA:  Right upper quadrant pain.  Mass lesion. EXAM: ULTRASOUND ABDOMEN LIMITED RIGHT UPPER QUADRANT COMPARISON:  Ultrasound 07/24/2023.  CT scan 10/06/2022 FINDINGS: Gallbladder: Distended gallbladder. Slight wall thickening measured up to 6 mm. No adjacent fluid. No shadowing stones. Common bile duct: Diameter: 10 mm, upper limits of normal for patient's age. Liver: No focal lesion identified. Within normal limits in parenchymal echogenicity. Portal vein is patent on color Doppler imaging with normal direction of blood flow towards the liver. Other: Upper pole right-sided simple appearing renal cyst measuring 5.4 cm. IMPRESSION: Distended gallbladder with slight wall thickening but no stones or stranding. Please correlate with symptoms. Further workup as clinically appropriate. No biliary ductal dilatation. Common duct at the upper limits of normal for patient's age. Electronically Signed   By: Karen Kays M.D.   On: 09/03/2023 16:03    Pending Labs Unresulted Labs (From admission, onward)     Start     Ordered   09/03/23 1753  Lipid panel  Add-on,   AD        09/03/23 1752   09/03/23 1530  Blood culture (routine x 2)  BLOOD CULTURE X 2,   R (with STAT occurrences)      09/03/23 1530   09/03/23 1320  Cancer antigen 19-9  Once,   URGENT        09/03/23 1319            Vitals/Pain Today's  Vitals   09/03/23 1531 09/03/23 1533 09/03/23 1715 09/03/23 1745  BP:   134/68 (!) 121/55  Pulse:   62 (!) 58  Resp:   18 11  Temp: 97.9 F (36.6 C)     SpO2:   100% 100%  Weight:      Height:      PainSc:  0-No pain      Isolation Precautions No active isolations  Medications Medications  lactated ringers bolus 1,000 mL (0 mLs Intravenous Stopped 09/03/23 1452)  ceFEPIme (MAXIPIME) 2 g in sodium chloride 0.9 % 100 mL IVPB (0 g Intravenous Stopped 09/03/23 1646)    And  metroNIDAZOLE (FLAGYL) IVPB 500 mg (0 mg Intravenous Stopped 09/03/23 1745)    Mobility walks     Focused Assessments Cardiac Assessment Handoff:  Cardiac Rhythm: Normal sinus rhythm No results found for: "CKTOTAL", "CKMB", "CKMBINDEX", "TROPONINI" No results found for: "DDIMER" Does the Patient currently have chest pain? No    R Recommendations: See Admitting Provider Note  Report given to:   Additional Notes:

## 2023-09-03 NOTE — ED Provider Notes (Signed)
[place] EMERGENCY DEPARTMENT AT [facility] Provider Note   CSN: [id number] Arrival date & time: 09/03/23  1301     History  Chief Complaint  Patient presents with   Hypotension    Suzanne Harrison is a 72 y.o. female.  72 year old female with a history of cholecystitis status post percutaneous Coley cystostomy tube in 2020, ischemic colitis, CKD, hypertension, and hyperlipidemia who presents to the emergency department with abdominal and back pain and jaundice.  4 weeks ago started feeling generalized fatigue.  3 weeks ago started noticing some skin color changes and jaundice.  Urine has also changed to darker color.  No stool changes.  Says that she is having difficult to characterize epigastric abdominal pain that radiates to her back.  Worsened with moving.  Has been nauseous but no vomiting.  No weight changes.  No history of cancer.  No new medications.  No history of liver disease.  No alcohol or recreational drug use.  Was referred in by her outpatient doctor today.       Home Medications Prior to Admission medications   Medication Sig Start Date End Date Taking? Authorizing Provider  albuterol (VENTOLIN HFA) 108 (90 Base) MCG/ACT inhaler Inhale 1 puff into the lungs every 4 (four) hours as needed for wheezing or shortness of breath.    [provider]  aspirin EC 81 MG tablet Take 81 mg by mouth at bedtime.    [provider]  atenolol (TENORMIN) 50 MG tablet Take 50 mg by mouth daily. 09/18/19   [provider]  Biotin 10 MG TABS Take 10 mg by mouth daily.    [provider]  buPROPion (WELLBUTRIN XL) 150 MG 24 hr tablet Take 150 mg by mouth daily. 02/23/23   [provider]  Calcium Carbonate-Vitamin D (CALCIUM-D PO) Take 1 tablet by mouth 2 (two) times daily.    [provider]  cetirizine (ZYRTEC) 10 MG tablet Take 10 mg by mouth daily.    [provider]  citalopram (CELEXA) 40 MG tablet Take  40 mg by mouth daily. 02/23/18   [provider]  DENTA 5000 PLUS 1.1 % CREA dental cream Place 1 application onto teeth 2 (two) times daily. Do not eat, drink or rinse for 30 minutes after use. 08/12/19   [provider]  diclofenac Sodium (VOLTAREN) 1 % GEL Apply 2 g topically 4 (four) times daily as needed (pain). 08/13/22   [provider]  furosemide (LASIX) 20 MG tablet 20 mg daily as needed for fluid.    [provider]  ipratropium-albuterol (DUONEB) 0.5-2.5 (3) MG/3ML SOLN Inhale 3 mLs into the lungs every 6 (six) hours as needed (shortness of breath). 12/25/19   [provider]  levothyroxine (SYNTHROID, LEVOTHROID) 50 MCG tablet Take 50 mcg by mouth at bedtime. 02/15/18   [provider]  lisinopril (PRINIVIL,ZESTRIL) 5 MG tablet Take 5 mg by mouth daily. 02/09/18   [provider]  Multiple Vitamin (MULTIVITAMIN WITH MINERALS) TABS tablet Take 1 tablet by mouth daily.     [provider]  Multiple Vitamins-Minerals (HAIR/SKIN/NAILS/BIOTIN) TABS Take 1 tablet by mouth daily.     [provider]  oxyCODONE-acetaminophen (PERCOCET) 10-325 MG tablet Take 1 tablet by mouth every 6 (six) hours as needed for pain. 03/01/13   [provider]  pantoprazole (PROTONIX) 40 MG tablet Take 40 mg by mouth 2 (two) times daily. 02/11/18   [provider]  polyvinyl alcohol (ARTIFICIAL TEARS)  1.4 % ophthalmic solution Place 1 drop into both eyes 2 (two) times daily as needed for dry eyes.    [provider]  pregabalin (LYRICA) 150 MG capsule Take 150 mg by mouth 2 (two) times daily.     [provider]  rosuvastatin (CRESTOR) 10 MG tablet Take 10 mg by mouth at bedtime.  02/15/18   [provider]  tolterodine (DETROL LA) 2 MG 24 hr capsule Take 2 mg by mouth daily.    [provider]      Allergies    Chlorthalidone, Sulfamethazine, Sulfur, Gatifloxacin, and Sulfa antibiotics     Review of Systems   Review of Systems  Physical Exam Updated Vital Signs BP 126/70 (BP Location: Right Arm)   Pulse 67   Temp 98.1 F (36.7 C) (Oral)   Resp 18   Ht 5\' 2"  (1.575 m)   Wt 82.1 kg   LMP  (LMP Unknown)   SpO2 100%   BMI 33.10 kg/m  Physical Exam Vitals and nursing note reviewed.  Constitutional:      General: She is not in acute distress.    Appearance: She is well-developed.  HENT:     Head: Normocephalic and atraumatic.     Right Ear: External ear normal.     Left Ear: External ear normal.     Nose: Nose normal.  Eyes:     Extraocular Movements: Extraocular movements intact.     Conjunctiva/sclera: Conjunctivae normal.     Pupils: Pupils are equal, round, and reactive to light.  Cardiovascular:     Rate and Rhythm: Normal rate and regular rhythm.     Heart sounds: No murmur heard. Pulmonary:     Effort: Pulmonary effort is normal. No respiratory distress.     Breath sounds: Normal breath sounds.  Abdominal:     General: There is distension.     Palpations: There is no mass.     Tenderness: There is abdominal tenderness (Epigastrium). There is no guarding.  Musculoskeletal:     Cervical back: Normal range of motion and neck supple.     Right lower leg: No edema.     Left lower leg: No edema.  Skin:    General: Skin is warm and dry.     Coloration: Skin is jaundiced.  Neurological:     Mental Status: She is alert and oriented to person, place, and time. Mental status is at baseline.  Psychiatric:        Mood and Affect: Mood normal.     ED Results / Procedures / Treatments   Labs (all labs ordered are listed, but only abnormal results are displayed) Labs Reviewed  COMPREHENSIVE METABOLIC PANEL - Abnormal; Notable for the following components:      Result Value   Sodium 132 (*)    Glucose, Bld 145 (*)    Creatinine, Ser 1.54 (*)    Calcium 8.5 (*)    Albumin 2.4 (*)    AST 164 (*)    ALT 92 (*)    Alkaline Phosphatase 858 (*)     Total Bilirubin 9.9 (*)    GFR, Estimated 36 (*)    All other components within normal limits  CBC WITH DIFFERENTIAL/PLATELET - Abnormal; Notable for the following components:   WBC 23.8 (*)    RBC 3.74 (*)    Hemoglobin 10.6 (*)    HCT 32.9 (*)    RDW 17.3 (*)    Neutro Abs 21.7 (*)  Monocytes Absolute 1.1 (*)    Abs Immature Granulocytes 0.14 (*)    All other components within normal limits  URINALYSIS, ROUTINE W REFLEX MICROSCOPIC - Abnormal; Notable for the following components:   Color, Urine AMBER (*)    APPearance HAZY (*)    Bilirubin Urine SMALL (*)    Bacteria, UA MANY (*)    All other components within normal limits  LIPID PANEL - Abnormal; Notable for the following components:   Cholesterol 201 (*)    HDL 23 (*)    LDL Cholesterol 164 (*)    All other components within normal limits  CULTURE, BLOOD (ROUTINE X 2)  CULTURE, BLOOD (ROUTINE X 2)  LIPASE, BLOOD  CANCER ANTIGEN 19-9  I-STAT CG4 LACTIC ACID, ED  I-STAT CG4 LACTIC ACID, ED    EKG EKG Interpretation Date/Time:  Friday September 03 2023 13:10:16 EDT Ventricular Rate:  60 PR Interval:  189 QRS Duration:  129 QT Interval:  501 QTC Calculation: 501 R Axis:   -15  Text Interpretation: Sinus rhythm Consider left atrial enlargement LVH with IVCD and secondary repol abnrm Prolonged QT interval Confirmed by Vonita Moss [id number]) on 09/03/2023 2:19:11 PM  Radiology US Abdomen Limited RUQ (LIVER/GB)  Result Date: 09/03/2023 CLINICAL DATA:  Right upper quadrant pain.  Mass lesion. EXAM: ULTRASOUND ABDOMEN LIMITED RIGHT UPPER QUADRANT COMPARISON:  Ultrasound 07/24/2023.  CT scan 10/06/2022 FINDINGS: Gallbladder: Distended gallbladder. Slight wall thickening measured up to 6 mm. No adjacent fluid. No shadowing stones. Common bile duct: Diameter: 10 mm, upper limits of normal for patient's age. Liver: No focal lesion identified. Within normal limits in parenchymal echogenicity. Portal vein is patent on color  Doppler imaging with normal direction of blood flow towards the liver. Other: Upper pole right-sided simple appearing renal cyst measuring 5.4 cm. IMPRESSION: Distended gallbladder with slight wall thickening but no stones or stranding. Please correlate with symptoms. Further workup as clinically appropriate. No biliary ductal dilatation. Common duct at the upper limits of normal for patient's age. Electronically Signed   By: Karen Kays M.D.   On: 09/03/2023 16:03    Procedures Procedures    Medications Ordered in ED Medications  piperacillin-tazobactam (ZOSYN) IVPB 3.375 g (has no administration in time range)  lactated ringers bolus 1,000 mL (has no administration in time range)  lactated ringers infusion (has no administration in time range)  lactated ringers bolus 1,000 mL (0 mLs Intravenous Stopped 09/03/23 1452)  ceFEPIme (MAXIPIME) 2 g in sodium chloride 0.9 % 100 mL IVPB (0 g Intravenous Stopped 09/03/23 1646)    And  metroNIDAZOLE (FLAGYL) IVPB 500 mg (0 mg Intravenous Stopped 09/03/23 1745)  gadobutrol (GADAVIST) 1 MMOL/ML injection 10 mL (10 mLs Intravenous Contrast Given 09/03/23 2028)    ED Course/ Medical Decision Making/ A&P Clinical Course as of 09/03/23 2108  Fri Sep 03, 2023  1656 Dr Ewing Schlein from [facility] recommends MRCP and admission.  They will see the patient tomorrow. [RP]  1703 Signed patient out to Dr Particia Nearing [RP]    Clinical Course User Index [RP] Rondel Baton, MD                                 Medical Decision Making Amount and/or Complexity of Data Reviewed Labs: ordered. Radiology: ordered.  Risk Prescription drug management. Decision regarding hospitalization.   [place] Pouliot is a 72 y.o. female with comorbidities that complicate the patient evaluation including  cholecystitis status post percutaneous Coley cystostomy tube in 2020, ischemic colitis, CKD, hypertension, and hyperlipidemia who presents to the emergency department with  abdominal and back pain and jaundice   Initial Ddx:  Pancreatic cancer, cholangiocarcinoma, cholangitis, choledocholithiasis, liver failure, drug-induced liver injury  MDM/Course:  Patient presents to the emergency department with several weeks of jaundice as well as epigastric abdominal pain and nausea.  Has had decreased p.o. intake recently.  Was transferred by her outpatient doctor due to low blood pressure.  It improved after EMS did give her some fluids.  Was given additional fluid bolus with normalization of her blood pressure.  Was afebrile and not in acute distress on arrival.  Labs did show a bilirubin of nearly 10 today with mild elevation in her AST and ALT and large elevations in her alkaline phosphatase.  Right upper quadrant ultrasound showed some sludge and gallbladder wall thickening but unclear how much of this is chronic versus acute.  MRCP was ordered after discussing with GI.  When her labs showed that she had a leukocytosis in the 20s she was started on cefepime and Flagyl in case of cholangitis though feel this is less likely.  No drug exposures or alcohol use to suggest drug-induced liver injury.  Upon re-evaluation patient remained stable.  Signed out to the oncoming physician awaiting admission.  This patient presents to the ED for concern of complaints listed in HPI, this involves an extensive number of treatment options, and is a complaint that carries with it a high risk of complications and morbidity. Disposition including potential need for admission considered.   Dispo: Admit to Floor  Records reviewed Outpatient Clinic Notes The following labs were independently interpreted: Chemistry and show  cholestatic liver injury pattern I independently reviewed the following imaging with scope of interpretation limited to determining acute life threatening conditions related to emergency care:  Right upper quadrant ultrasound  and agree with the radiologist interpretation with  the following exceptions: none I personally reviewed and interpreted cardiac monitoring: normal sinus rhythm  I personally reviewed and interpreted the pt's EKG: see above for interpretation  I have reviewed the patients home medications and made adjustments as needed Consults: Gastroenterology Social Determinants of health:  Elderly  Portions of this note were generated with Scientist, clinical (histocompatibility and immunogenetics). Dictation errors may occur despite best attempts at proofreading.           Final Clinical Impression(s) / ED Diagnoses Final diagnoses:  Jaundice  Epigastric pain  Hepatic trauma, initial encounter    Rx / DC Orders ED Discharge Orders     None         Rondel Baton, MD 09/03/23 2108

## 2023-09-03 NOTE — ED Notes (Signed)
No iv in her lt a-c when I came in to start her iv  iv placed rt forearm  charted

## 2023-09-03 NOTE — ED Provider Notes (Signed)
Pt signed out by Dr. Jarold Motto pending callback from the hospitalist.  Pt d/w Dr. Lowell Guitar (triad) for admission.   Jacalyn Lefevre, MD 09/03/23 [phone]

## 2023-09-03 NOTE — ED Notes (Signed)
Patient assisted to bedside commode independently and attempted to provide urine specimen without success. Patient back in bed with call light within reach.

## 2023-09-04 ENCOUNTER — Encounter (HOSPITAL_COMMUNITY): Payer: Self-pay | Admitting: Family Medicine

## 2023-09-04 DIAGNOSIS — R17 Unspecified jaundice: Secondary | ICD-10-CM | POA: Diagnosis not present

## 2023-09-04 LAB — COMPREHENSIVE METABOLIC PANEL
ALT: 68 U/L — ABNORMAL HIGH (ref 0–44)
AST: 110 U/L — ABNORMAL HIGH (ref 15–41)
Albumin: 1.9 g/dL — ABNORMAL LOW (ref 3.5–5.0)
Alkaline Phosphatase: 639 U/L — ABNORMAL HIGH (ref 38–126)
Anion gap: 6 (ref 5–15)
BUN: 16 mg/dL (ref 8–23)
CO2: 27 mmol/L (ref 22–32)
Calcium: 8.4 mg/dL — ABNORMAL LOW (ref 8.9–10.3)
Chloride: 102 mmol/L (ref 98–111)
Creatinine, Ser: 1.04 mg/dL — ABNORMAL HIGH (ref 0.44–1.00)
GFR, Estimated: 58 mL/min — ABNORMAL LOW (ref >60)
Glucose, Bld: 78 mg/dL (ref 70–99)
Potassium: 3.1 mmol/L — ABNORMAL LOW (ref 3.5–5.1)
Sodium: 135 mmol/L (ref 135–145)
Total Bilirubin: 8.1 mg/dL — ABNORMAL HIGH (ref 0.3–1.2)
Total Protein: 5.3 g/dL — ABNORMAL LOW (ref 6.5–8.1)

## 2023-09-04 LAB — TYPE AND SCREEN
ABO/RH(D): A POS
Antibody Screen: NEGATIVE

## 2023-09-04 LAB — CANCER ANTIGEN 19-9: CA 19-9: 190 U/mL — ABNORMAL HIGH (ref 0–35)

## 2023-09-04 LAB — BLOOD CULTURE ID PANEL (REFLEXED) - BCID2

## 2023-09-04 LAB — PROCALCITONIN: Procalcitonin: 4.34 ng/mL

## 2023-09-04 LAB — CBC WITH DIFFERENTIAL/PLATELET
Abs Immature Granulocytes: 0.07 10*3/uL (ref 0.00–0.07)
Basophils Absolute: 0 10*3/uL (ref 0.0–0.1)
Basophils Relative: 0 %
Eosinophils Absolute: 0.1 10*3/uL (ref 0.0–0.5)
Eosinophils Relative: 1 %
HCT: 27.2 % — ABNORMAL LOW (ref 36.0–46.0)
Hemoglobin: 8.8 g/dL — ABNORMAL LOW (ref 12.0–15.0)
Immature Granulocytes: 1 %
Lymphocytes Relative: 10 %
Lymphs Abs: 1.2 10*3/uL (ref 0.7–4.0)
MCH: 27.7 pg (ref 26.0–34.0)
MCHC: 32.4 g/dL (ref 30.0–36.0)
MCV: 85.5 fL (ref 80.0–100.0)
Monocytes Absolute: 0.5 10*3/uL (ref 0.1–1.0)
Monocytes Relative: 5 %
Neutro Abs: 10.1 10*3/uL — ABNORMAL HIGH (ref 1.7–7.7)
Neutrophils Relative %: 83 %
Platelets: 146 10*3/uL — ABNORMAL LOW (ref 150–400)
RBC: 3.18 MIL/uL — ABNORMAL LOW (ref 3.87–5.11)
RDW: 17.2 % — ABNORMAL HIGH (ref 11.5–15.5)
WBC: 12 10*3/uL — ABNORMAL HIGH (ref 4.0–10.5)
nRBC: 0 % (ref 0.0–0.2)

## 2023-09-04 LAB — PROTIME-INR
INR: 1.2 (ref 0.8–1.2)
Prothrombin Time: 15.2 s (ref 11.4–15.2)

## 2023-09-04 LAB — MAGNESIUM: Magnesium: 1.5 mg/dL — ABNORMAL LOW (ref 1.7–2.4)

## 2023-09-04 LAB — C-REACTIVE PROTEIN: CRP: 18.7 mg/dL — ABNORMAL HIGH (ref <1.0)

## 2023-09-04 LAB — BRAIN NATRIURETIC PEPTIDE: B Natriuretic Peptide: 236 pg/mL — ABNORMAL HIGH (ref 0.0–100.0)

## 2023-09-04 MED ORDER — LACTATED RINGERS IV SOLN: INTRAVENOUS | Status: DC

## 2023-09-04 MED ORDER — METOPROLOL TARTRATE 5 MG/5ML IV SOLN: 5.0000 mg | Freq: Three times a day (TID) | INTRAVENOUS | Status: DC | PRN

## 2023-09-04 MED ORDER — SODIUM CHLORIDE 0.9 % IV SOLN
2.0000 g | INTRAVENOUS | Status: DC
  Administered 2023-09-05 – 2023-09-07 (×3): 2 g via INTRAVENOUS
  Filled 2023-09-04 (×3): qty 20

## 2023-09-04 MED ORDER — HEPARIN SODIUM (PORCINE) 5000 UNIT/ML IJ SOLN
5000.0000 [IU] | Freq: Three times a day (TID) | INTRAMUSCULAR | Status: DC
  Administered 2023-09-04 – 2023-09-07 (×9): 5000 [IU] via SUBCUTANEOUS
  Filled 2023-09-04 (×9): qty 1

## 2023-09-04 MED ORDER — HYDRALAZINE HCL 20 MG/ML IJ SOLN: 10.0000 mg | Freq: Four times a day (QID) | INTRAMUSCULAR | Status: DC | PRN

## 2023-09-04 MED ORDER — METOPROLOL TARTRATE 25 MG PO TABS
25.0000 mg | ORAL_TABLET | Freq: Two times a day (BID) | ORAL | Status: DC
  Administered 2023-09-04 – 2023-09-05 (×3): 25 mg via ORAL
  Filled 2023-09-04 (×3): qty 1

## 2023-09-04 MED ORDER — MAGNESIUM SULFATE 4 GM/100ML IV SOLN
4.0000 g | Freq: Once | INTRAVENOUS | Status: AC
  Administered 2023-09-04: 4 g via INTRAVENOUS
  Filled 2023-09-04: qty 100

## 2023-09-04 MED ORDER — LOPERAMIDE HCL 2 MG PO CAPS
2.0000 mg | ORAL_CAPSULE | Freq: Four times a day (QID) | ORAL | Status: DC | PRN
  Administered 2023-09-04 (×2): 2 mg via ORAL
  Filled 2023-09-04 (×2): qty 1

## 2023-09-04 MED ORDER — POTASSIUM CHLORIDE 10 MEQ/100ML IV SOLN
10.0000 meq | INTRAVENOUS | Status: AC
  Administered 2023-09-04 (×4): 10 meq via INTRAVENOUS
  Filled 2023-09-04: qty 100

## 2023-09-04 NOTE — Progress Notes (Signed)
PROGRESS NOTE                                                                                                                                                                                                             Patient Demographics:    Suzanne Harrison, is a 72 y.o. female, DOB - [birth year], ION:[id number]  Outpatient Primary MD for the patient is Maurice Small, MD (Inactive)    LOS - 1  Admit date - 09/03/2023    Chief Complaint  Patient presents with   Hypotension       Brief Narrative (HPI from H&P)   72 y.o. female with medical history significant of CKD, gallstones, HTN, dyslipidemia, ischemic colitis, cholecystitis s/p perc cholecystostomy tube (10/04/2019) and other medical issues who presented with abdominal pain.  Further workup suggested that she had a CBD stone with gallbladder distention.   Subjective:    [place] B[place] today has, No headache, No chest pain, Mild RUQ abdominal pain - No Nausea, No new weakness tingling or numbness, no SOB   Assessment  & Plan :     Right upper quadrant abdominal Pain  Jaundice  Elevated Liver Enzymes, History Cholecystitis s/p Perc Chole Tube (10/2019) - now has CBD stone with gallbladder distention.  Possible mild ascending cholangitis. She is being treated conservatively for now, bowel rest, IV fluids, empiric IV fluids, clinically better, GI to see the patient likely will undergo ERCP with stone removal, also have informed general surgery as she may eventually require cholecystectomy.  Continue to monitor.   Acute Kidney Injury Follow with IVF, improving   Hypokalemia and hypomagnesemia.  Replaced.    Prolonged Qtc Caution with QT prolonging meds, replace electrolytes.   Hypertension Hold lisinopril and lasix for now Atenolol continued   Depression  Wellbutrin, celexa   Hypothyroidism  Synthroid  GERD PPI   Chronic Pain Lyrica  Prn  oxycodone   Overactive Bladder Detrol LA  Dyslipidemia Statin on hold with abnormal LFT's   Obesity Body mass index is 33.1 kg/m.  Follow-up with PCP for weight loss.         Condition - Guarded  Family Communication  :  husband Loraine Leriche [phone] on 09/04/2023  Code Status :  Full  Consults  :  GI, CCS  PUD Prophylaxis : PPI   Procedures  :  MRCP - 1. Choledocholithiasis, with large gallstone near the ampulla measuring 1.0 cm. Intra and extrahepatic biliary ductal dilatation, the central common bile duct measuring up to 1.2 cm. Notably, there was a calculus in the central common bile duct on examination dated 10/06/2022, calculus on today's examination appears to be substantially larger. 2. Mildly distended gallbladder with additional tiny gallstones. 3. Mild left hydronephrosis with abrupt caliber change at the ureteropelvic junction, suggesting chronic ureteropelvic junction stricture, appearance generally unchanged compared to prior examination. Nuclear scintigraphic Lasix renogram can be considered on a nonemergent, outpatient basis to assess for functional significance if and when clinically appropriate.       Disposition Plan  :    Status is: Inpatient   DVT Prophylaxis  :  Heparin    Lab Results  Component Value Date   PLT 146 (L) 09/04/2023    Diet :  Diet Order             Diet NPO time specified  Diet effective midnight                    Inpatient Medications  Scheduled Meds:  pantoprazole (PROTONIX) IV  40 mg Intravenous Q24H   Continuous Infusions:  lactated ringers     magnesium sulfate bolus IVPB     piperacillin-tazobactam (ZOSYN)  IV 3.375 g (09/04/23 0423)   potassium chloride     PRN Meds:.HYDROmorphone (DILAUDID) injection, naLOXone (NARCAN)  injection    Objective:   Vitals:   09/04/23 0029 09/04/23 0415 09/04/23 0430 09/04/23 0738  BP: 131/61 137/65  123/65  Pulse: 64 68 69   Resp: 17 17 20    Temp: 98 F (36.7 C)  97.6 F (36.4 C)    TempSrc: Oral Oral    SpO2: 99% 95% 97%   Weight:      Height:        Wt Readings from Last 3 Encounters:  09/03/23 82.1 kg  07/24/23 82.1 kg  10/10/22 82.1 kg     Intake/Output Summary (Last 24 hours) at 09/04/2023 0755 Last data filed at 09/03/2023 1452 Gross per 24 hour  Intake 1250 ml  Output --  Net 1250 ml     Physical Exam  Awake Alert, No new F.N deficits, Normal affect [place].AT,PERRAL Supple Neck, No JVD,   Symmetrical Chest wall movement, Good air movement bilaterally, CTAB RRR,No Gallops,Rubs or new Murmurs,  +ve B.Sounds, Abd Soft, No tenderness,   No Cyanosis, Clubbing or edema     Data Review:    Recent Labs  Lab 09/03/23 1320 09/04/23 0621  WBC 23.8* 12.0*  HGB 10.6* 8.8*  HCT 32.9* 27.2*  PLT 211 146*  MCV 88.0 85.5  MCH 28.3 27.7  MCHC 32.2 32.4  RDW 17.3* 17.2*  LYMPHSABS 0.8 1.2  MONOABS 1.1* 0.5  EOSABS 0.0 0.1  BASOSABS 0.0 0.0    Recent Labs  Lab 09/03/23 1320 09/03/23 1555 09/04/23 0621  NA 132*  --  135  K 3.8  --  3.1*  CL 98  --  102  CO2 23  --  27  ANIONGAP 11  --  6  GLUCOSE 145*  --  78  BUN 18  --  16  CREATININE 1.54*  --  1.04*  AST 164*  --  110*  ALT 92*  --  68*  ALKPHOS 858*  --  639*  BILITOT 9.9*  --  8.1*  ALBUMIN 2.4*  --  1.9*  CRP  --   --  18.7*  LATICACIDVEN  --  0.6  --   INR  --   --  1.2  BNP  --   --  236.0*  MG  --   --  1.5*  CALCIUM 8.5*  --  8.4*      Recent Labs  Lab 09/03/23 1320 09/03/23 1555 09/04/23 0621  CRP  --   --  18.7*  LATICACIDVEN  --  0.6  --   INR  --   --  1.2  BNP  --   --  236.0*  MG  --   --  1.5*  CALCIUM 8.5*  --  8.4*    --------------------------------------------------------------------------------------------------------------- Lab Results  Component Value Date   CHOL 201 (H) 09/03/2023   HDL 23 (L) 09/03/2023   LDLCALC 164 (H) 09/03/2023   TRIG 71 09/03/2023   CHOLHDL 8.7 09/03/2023    No results found for: "HGBA1C" No  results for input(s): "TSH", "T4TOTAL", "FREET4", "T3FREE", "THYROIDAB" in the last 72 hours. No results for input(s): "VITAMINB12", "FOLATE", "FERRITIN", "TIBC", "IRON", "RETICCTPCT" in the last 72 hours. ------------------------------------------------------------------------------------------------------------------ Cardiac Enzymes No results for input(s): "CKMB", "TROPONINI", "MYOGLOBIN" in the last 168 hours.  Invalid input(s): "CK"  Micro Results Recent Results (from the past 240 hour(s))  Blood culture (routine x 2)     Status: None (Preliminary result)   Collection Time: 09/03/23  3:54 PM   Specimen: BLOOD  Result Value Ref Range Status   Specimen Description BLOOD BLOOD RIGHT ARM  Final   Special Requests   Final    BOTTLES DRAWN AEROBIC AND ANAEROBIC Blood Culture adequate volume   Culture   Final    NO GROWTH < 24 HOURS Performed at [facility], [address] [place], [place] [address]    Report Status PENDING  Incomplete  Blood culture (routine x 2)     Status: None (Preliminary result)   Collection Time: 09/03/23  4:00 PM   Specimen: BLOOD  Result Value Ref Range Status   Specimen Description BLOOD BLOOD LEFT ARM  Final   Special Requests   Final    BOTTLES DRAWN AEROBIC AND ANAEROBIC Blood Culture adequate volume   Culture   Final    NO GROWTH < 24 HOURS Performed at [facility], [address], [place], [place] [address]    Report Status PENDING  Incomplete    Radiology Reports MR ABDOMEN MRCP W WO CONTAST  Result Date: 09/03/2023 CLINICAL DATA:  Jaundice EXAM: MRI ABDOMEN WITHOUT AND WITH CONTRAST (INCLUDING MRCP) TECHNIQUE: Multiplanar multisequence MR imaging of the abdomen was performed both before and after the administration of intravenous contrast. Heavily T2-weighted images of the biliary and pancreatic ducts were obtained, and three-dimensional MRCP images were rendered by post processing. CONTRAST:  10mL GADAVIST GADOBUTROL 1 MMOL/ML IV  SOLN COMPARISON:  Right upper quadrant ultrasound, 09/03/2023 FINDINGS: Lower chest: No acute abnormality.  Cardiomegaly. Hepatobiliary: No solid liver abnormality is seen. Choledocholithiasis, with large gallstone near the ampulla measuring 1.0 cm. Intra and extrahepatic biliary ductal dilatation, the central common bile duct measuring up to 1.2 cm. Mildly distended gallbladder with additional tiny gallstones. Pancreas: Unremarkable. No pancreatic ductal dilatation or surrounding inflammatory changes. Spleen: Mild splenomegaly, maximum span 13.5 cm. Adrenals/Urinary Tract: Adrenal glands are unremarkable. Numerous bilateral simple and thinly septated bilateral renal cortical cysts, benign, for which no further follow-up or characterization is required. Mild left hydronephrosis with abrupt caliber change at the ureteropelvic junction, suggesting chronic ureteropelvic junction stricture, appearance  generally unchanged compared to prior examination. Stomach/Bowel: Stomach is within normal limits. No evidence of bowel wall thickening, distention, or inflammatory changes. Vascular/Lymphatic: No significant vascular findings are present. No enlarged abdominal lymph nodes. Other: No abdominal wall hernia or abnormality. No ascites. Musculoskeletal: No acute or significant osseous findings. IMPRESSION: 1. Choledocholithiasis, with large gallstone near the ampulla measuring 1.0 cm. Intra and extrahepatic biliary ductal dilatation, the central common bile duct measuring up to 1.2 cm. Notably, there was a calculus in the central common bile duct on examination dated 10/06/2022, calculus on today's examination appears to be substantially larger. 2. Mildly distended gallbladder with additional tiny gallstones. 3. Mild left hydronephrosis with abrupt caliber change at the ureteropelvic junction, suggesting chronic ureteropelvic junction stricture, appearance generally unchanged compared to prior examination. Nuclear  scintigraphic Lasix renogram can be considered on a nonemergent, outpatient basis to assess for functional significance if and when clinically appropriate. Electronically Signed   By: Jearld Lesch M.D.   On: 09/03/2023 21:28   US Abdomen Limited RUQ (LIVER/GB)  Result Date: 09/03/2023 CLINICAL DATA:  Right upper quadrant pain.  Mass lesion. EXAM: ULTRASOUND ABDOMEN LIMITED RIGHT UPPER QUADRANT COMPARISON:  Ultrasound 07/24/2023.  CT scan 10/06/2022 FINDINGS: Gallbladder: Distended gallbladder. Slight wall thickening measured up to 6 mm. No adjacent fluid. No shadowing stones. Common bile duct: Diameter: 10 mm, upper limits of normal for patient's age. Liver: No focal lesion identified. Within normal limits in parenchymal echogenicity. Portal vein is patent on color Doppler imaging with normal direction of blood flow towards the liver. Other: Upper pole right-sided simple appearing renal cyst measuring 5.4 cm. IMPRESSION: Distended gallbladder with slight wall thickening but no stones or stranding. Please correlate with symptoms. Further workup as clinically appropriate. No biliary ductal dilatation. Common duct at the upper limits of normal for patient's age. Electronically Signed   By: Karen Kays M.D.   On: 09/03/2023 16:03      Signature  -   Susa Raring M.D on 09/04/2023 at 7:55 AM   -  To page go to [email or web address]

## 2023-09-04 NOTE — Consult Note (Signed)
Reason for Consult: Choledocholithiasis Referring Physician: Thedore Mins MD  Suzanne Harrison is an 72 y.o. female.  HPI: 72 year old female admitted for jaundice, abdominal pain and elevated LFTs.  History of failed laparoscopic cholecystectomy in 2020 with Dr. Dwain Sarna with placement of a drain.  Due to severe scarring and inflammation the procedure was aborted.  She presents with a 1 month history of failure to thrive, jaundice, and abdominal pain.  The pain is resolved.  MR CP shows choledocholithiasis and she is scheduled to see GI medicine for consideration of ERCP.  She has no pain today.  Past Medical History:  Diagnosis Date   Arthritis    CKD (chronic kidney disease)    Gallstones 10/2019   Hypercholesteremia    Hypertension    Loculated pleural effusion 12/2019   right    Past Surgical History:  Procedure Laterality Date   BACK SURGERY     BILIARY DRAINAGE  10/03/2019   ATTEMPTED LAPAROSCOPIC CHOLECYSTECTOMY WITH PLACEMENT OF DRAIN (N/A Abdomen)   CHOLECYSTECTOMY N/A 10/03/2019   Procedure: ATTEMPTED LAPAROSCOPIC CHOLECYSTECTOMY WITH PLACEMENT OF DRAIN;  Surgeon: Emelia Loron, MD;  Location: [facility] OR;  Service: General;  Laterality: N/A;   IR RADIOLOGIST EVAL & MGMT  11/16/2019   IR RADIOLOGIST EVAL & MGMT  12/13/2019   IR THORACENTESIS ASP PLEURAL SPACE W/IMG GUIDE  12/18/2019   REPLACEMENT TOTAL HIP W/  RESURFACING IMPLANTS      Family History  Problem Relation Age of Onset   Stroke Mother    COPD Mother    Heart disease Father    Sleep apnea Brother     Social History:  reports that she has never smoked. She has never used smokeless tobacco. She reports that she does not drink alcohol and does not use drugs.  Allergies:  Allergies  Allergen Reactions   Chlorthalidone Other (See Comments)    hyponatremia   Sulfamethazine Other (See Comments)   Sulfur Other (See Comments)    Client doesn't remember type of reaction   Gatifloxacin Other (See Comments) and Rash     Other reaction(s): Myalgias (Muscle Pain)  Rash in mouth   Sulfa Antibiotics Other (See Comments)    GI Intolerance    Medications: I have reviewed the patient's current medications.  Results for orders placed or performed during the hospital encounter of 09/03/23 (from the past 48 hour(s))  Comprehensive metabolic panel     Status: Abnormal   Collection Time: 09/03/23  1:20 PM  Result Value Ref Range   Sodium 132 (L) 135 - 145 mmol/L   Potassium 3.8 3.5 - 5.1 mmol/L   Chloride 98 98 - 111 mmol/L   CO2 23 22 - 32 mmol/L   Glucose, Bld 145 (H) 70 - 99 mg/dL    Comment: Glucose reference range applies only to samples taken after fasting for at least 8 hours.   BUN 18 8 - 23 mg/dL   Creatinine, Ser 2.84 (H) 0.44 - 1.00 mg/dL   Calcium 8.5 (L) 8.9 - 10.3 mg/dL   Total Protein 6.5 6.5 - 8.1 g/dL   Albumin 2.4 (L) 3.5 - 5.0 g/dL   AST 132 (H) 15 - 41 U/L   ALT 92 (H) 0 - 44 U/L   Alkaline Phosphatase 858 (H) 38 - 126 U/L   Total Bilirubin 9.9 (H) 0.3 - 1.2 mg/dL   GFR, Estimated 36 (L) >60 mL/min    Comment: (NOTE) Calculated using the CKD-EPI Creatinine Equation (2021)    Anion gap 11  5 - 15    Comment: Performed at [facility], [address], [place], [place] [address]  Lipase, blood     Status: None   Collection Time: 09/03/23  1:20 PM  Result Value Ref Range   Lipase 22 11 - 51 U/L    Comment: Performed at [facility], [address] [place], [place] [address]  CBC with Diff     Status: Abnormal   Collection Time: 09/03/23  1:20 PM  Result Value Ref Range   WBC 23.8 (H) 4.0 - 10.5 K/uL   RBC 3.74 (L) 3.87 - 5.11 MIL/uL   Hemoglobin 10.6 (L) 12.0 - 15.0 g/dL   HCT 29.5 (L) 62.1 - 30.8 %   MCV 88.0 80.0 - 100.0 fL   MCH 28.3 26.0 - 34.0 pg   MCHC 32.2 30.0 - 36.0 g/dL   RDW 65.7 (H) 84.6 - 96.2 %   Platelets 211 150 - 400 K/uL   nRBC 0.0 0.0 - 0.2 %   Neutrophils Relative % 92 %   Neutro Abs 21.7 (H) 1.7 - 7.7 K/uL   Lymphocytes Relative 3 %    Lymphs Abs 0.8 0.7 - 4.0 K/uL   Monocytes Relative 4 %   Monocytes Absolute 1.1 (H) 0.1 - 1.0 K/uL   Eosinophils Relative 0 %   Eosinophils Absolute 0.0 0.0 - 0.5 K/uL   Basophils Relative 0 %   Basophils Absolute 0.0 0.0 - 0.1 K/uL   Immature Granulocytes 1 %   Abs Immature Granulocytes 0.14 (H) 0.00 - 0.07 K/uL    Comment: Performed at [facility], [address] [place], [place] [address]  Cancer antigen 19-9     Status: Abnormal   Collection Time: 09/03/23  1:20 PM  Result Value Ref Range   CA 19-9 190 (H) 0 - 35 U/mL    Comment: (NOTE) Roche Diagnostics Electrochemiluminescence Immunoassay (ECLIA) Values obtained with different assay methods or kits cannot be used interchangeably.  Results cannot be interpreted as absolute evidence of the presence or absence of malignant disease. Performed At: [facility] [address] [place], [place] [id number] Jolene Schimke MD VO:[phone]   Blood culture (routine x 2)     Status: None (Preliminary result)   Collection Time: 09/03/23  3:54 PM   Specimen: BLOOD  Result Value Ref Range   Specimen Description BLOOD BLOOD RIGHT ARM    Special Requests      BOTTLES DRAWN AEROBIC AND ANAEROBIC Blood Culture adequate volume   Culture  Setup Time      GRAM NEGATIVE RODS AEROBIC BOTTLE ONLY CRITICAL RESULT CALLED TO, READ BACK BY AND VERIFIED WITH: PHARMD Cedric Fishman [phone] AT 1018 BY EC Performed at [facility], [address], [place], [place] [address]    Culture GRAM NEGATIVE RODS    Report Status PENDING   Blood Culture ID Panel (Reflexed)     Status: Abnormal   Collection Time: 09/03/23  3:54 PM  Result Value Ref Range   Enterococcus faecalis NOT DETECTED NOT DETECTED   Enterococcus Faecium NOT DETECTED NOT DETECTED   Listeria monocytogenes NOT DETECTED NOT DETECTED   Staphylococcus species NOT DETECTED NOT DETECTED   Staphylococcus aureus (BCID) NOT DETECTED NOT DETECTED   Staphylococcus epidermidis NOT  DETECTED NOT DETECTED   Staphylococcus lugdunensis NOT DETECTED NOT DETECTED   Streptococcus species NOT DETECTED NOT DETECTED   Streptococcus agalactiae NOT DETECTED NOT DETECTED   Streptococcus pneumoniae NOT DETECTED  NOT DETECTED   Streptococcus pyogenes NOT DETECTED NOT DETECTED   A.calcoaceticus-baumannii NOT DETECTED NOT DETECTED   Bacteroides fragilis NOT DETECTED NOT DETECTED   Enterobacterales DETECTED (A) NOT DETECTED    Comment: Enterobacterales represent a large order of gram negative bacteria, not a single organism. CRITICAL RESULT CALLED TO, READ BACK BY AND VERIFIED WITH: PHARMD NATHAN GOAD [phone] AT 1018 BY EC    Enterobacter cloacae complex NOT DETECTED NOT DETECTED   Escherichia coli DETECTED (A) NOT DETECTED    Comment: CRITICAL RESULT CALLED TO, READ BACK BY AND VERIFIED WITH: PHARMD NATHAN GOAD [phone] AT 1018 BY EC    Klebsiella aerogenes NOT DETECTED NOT DETECTED   Klebsiella oxytoca NOT DETECTED NOT DETECTED   Klebsiella pneumoniae NOT DETECTED NOT DETECTED   Proteus species NOT DETECTED NOT DETECTED   Salmonella species NOT DETECTED NOT DETECTED   Serratia marcescens NOT DETECTED NOT DETECTED   Haemophilus influenzae NOT DETECTED NOT DETECTED   Neisseria meningitidis NOT DETECTED NOT DETECTED   Pseudomonas aeruginosa NOT DETECTED NOT DETECTED   Stenotrophomonas maltophilia NOT DETECTED NOT DETECTED   Candida albicans NOT DETECTED NOT DETECTED   Candida auris NOT DETECTED NOT DETECTED   Candida glabrata NOT DETECTED NOT DETECTED   Candida krusei NOT DETECTED NOT DETECTED   Candida parapsilosis NOT DETECTED NOT DETECTED   Candida tropicalis NOT DETECTED NOT DETECTED   Cryptococcus neoformans/gattii NOT DETECTED NOT DETECTED   CTX-M ESBL NOT DETECTED NOT DETECTED   Carbapenem resistance IMP NOT DETECTED NOT DETECTED   Carbapenem resistance KPC NOT DETECTED NOT DETECTED   Carbapenem resistance NDM NOT DETECTED NOT DETECTED   Carbapenem resist OXA 48 LIKE  NOT DETECTED NOT DETECTED   Carbapenem resistance VIM NOT DETECTED NOT DETECTED    Comment: Performed at [facility], [address], [place], [place] [address]  I-Stat CG4 Lactic Acid     Status: None   Collection Time: 09/03/23  3:55 PM  Result Value Ref Range   Lactic Acid, Venous 0.6 0.5 - 1.9 mmol/L  Blood culture (routine x 2)     Status: None (Preliminary result)   Collection Time: 09/03/23  4:00 PM   Specimen: BLOOD  Result Value Ref Range   Specimen Description BLOOD BLOOD LEFT ARM    Special Requests      BOTTLES DRAWN AEROBIC AND ANAEROBIC Blood Culture adequate volume   Culture  Setup Time      GRAM NEGATIVE RODS AEROBIC BOTTLE ONLY CRITICAL VALUE NOTED.  VALUE IS CONSISTENT WITH PREVIOUSLY REPORTED AND CALLED VALUE. Performed at [facility], [address], [place], [place] [address]    Culture GRAM NEGATIVE RODS    Report Status PENDING   Urinalysis, Routine w reflex microscopic -Urine, Clean Catch     Status: Abnormal   Collection Time: 09/03/23  5:15 PM  Result Value Ref Range   Color, Urine AMBER (A) YELLOW    Comment: BIOCHEMICALS MAY BE AFFECTED BY COLOR   APPearance HAZY (A) CLEAR   Specific Gravity, Urine 1.014 1.005 - 1.030   pH 5.0 5.0 - 8.0   Glucose, UA NEGATIVE NEGATIVE mg/dL   Hgb urine dipstick NEGATIVE NEGATIVE   Bilirubin Urine SMALL (A) NEGATIVE   Ketones, ur NEGATIVE NEGATIVE mg/dL   Protein, ur NEGATIVE NEGATIVE mg/dL   Nitrite NEGATIVE NEGATIVE   Leukocytes,Ua NEGATIVE NEGATIVE   RBC / HPF 0-5 0 - 5 RBC/hpf   WBC, UA 0-5 0 - 5 WBC/hpf   Bacteria,  UA MANY (A) NONE SEEN   Squamous Epithelial / HPF 6-10 0 - 5 /HPF   Mucus PRESENT    Hyaline Casts, UA PRESENT    Granular Casts, UA PRESENT     Comment: Performed at [facility], [address], [place], [place] [address]  Lipid panel     Status: Abnormal   Collection Time: 09/03/23  6:56 PM  Result Value Ref Range   Cholesterol 201 (H) 0 - 200 mg/dL   Triglycerides 71  <485 mg/dL   HDL 23 (L) >46 mg/dL   Total CHOL/HDL Ratio 8.7 RATIO   VLDL 14 0 - 40 mg/dL   LDL Cholesterol 270 (H) 0 - 99 mg/dL    Comment:        Total Cholesterol/HDL:CHD Risk Coronary Heart Disease Risk Table                     Men   Women  1/2 Average Risk   3.4   3.3  Average Risk       5.0   4.4  2 X Average Risk   9.6   7.1  3 X Average Risk  23.4   11.0        Use the calculated Patient Ratio above and the CHD Risk Table to determine the patient's CHD Risk.        ATP III CLASSIFICATION (LDL):  <100     mg/dL   Optimal  350-093  mg/dL   Near or Above                    Optimal  130-159  mg/dL   Borderline  818-299  mg/dL   High  >371     mg/dL   Very High Performed at [facility], [address], [place], [place] [address]   Type and screen     Status: None   Collection Time: 09/04/23  6:14 AM  Result Value Ref Range   ABO/RH(D) A POS    Antibody Screen NEG    Sample Expiration      09/07/2023,2359 Performed at [facility], [address], [place], [place] [address]   CBC with Differential/Platelet     Status: Abnormal   Collection Time: 09/04/23  6:21 AM  Result Value Ref Range   WBC 12.0 (H) 4.0 - 10.5 K/uL   RBC 3.18 (L) 3.87 - 5.11 MIL/uL   Hemoglobin 8.8 (L) 12.0 - 15.0 g/dL   HCT 17.5 (L) 10.2 - 58.5 %   MCV 85.5 80.0 - 100.0 fL   MCH 27.7 26.0 - 34.0 pg   MCHC 32.4 30.0 - 36.0 g/dL   RDW 27.7 (H) 82.4 - 23.5 %   Platelets 146 (L) 150 - 400 K/uL   nRBC 0.0 0.0 - 0.2 %   Neutrophils Relative % 83 %   Neutro Abs 10.1 (H) 1.7 - 7.7 K/uL   Lymphocytes Relative 10 %   Lymphs Abs 1.2 0.7 - 4.0 K/uL   Monocytes Relative 5 %   Monocytes Absolute 0.5 0.1 - 1.0 K/uL   Eosinophils Relative 1 %   Eosinophils Absolute 0.1 0.0 - 0.5 K/uL   Basophils Relative 0 %   Basophils Absolute 0.0 0.0 - 0.1 K/uL   Immature Granulocytes 1 %   Abs Immature Granulocytes 0.07 0.00 - 0.07 K/uL    Comment: Performed at [facility], [address] [place], [place]  [address]  Comprehensive metabolic panel     Status: Abnormal   Collection Time: 09/04/23  6:21 AM  Result Value Ref Range   Sodium 135 135 - 145 mmol/L   Potassium 3.1 (L) 3.5 - 5.1 mmol/L   Chloride 102 98 - 111 mmol/L   CO2 27 22 - 32 mmol/L   Glucose, Bld 78 70 - 99 mg/dL    Comment: Glucose reference range applies only to samples taken after fasting for at least 8 hours.   BUN 16 8 - 23 mg/dL   Creatinine, Ser 2.84 (H) 0.44 - 1.00 mg/dL   Calcium 8.4 (L) 8.9 - 10.3 mg/dL   Total Protein 5.3 (L) 6.5 - 8.1 g/dL   Albumin 1.9 (L) 3.5 - 5.0 g/dL   AST 132 (H) 15 - 41 U/L   ALT 68 (H) 0 - 44 U/L   Alkaline Phosphatase 639 (H) 38 - 126 U/L   Total Bilirubin 8.1 (H) 0.3 - 1.2 mg/dL   GFR, Estimated 58 (L) >60 mL/min    Comment: (NOTE) Calculated using the CKD-EPI Creatinine Equation (2021)    Anion gap 6 5 - 15    Comment: Performed at [facility], [address], [place], [place] [address]  C-reactive protein     Status: Abnormal   Collection Time: 09/04/23  6:21 AM  Result Value Ref Range   CRP 18.7 (H) <1.0 mg/dL    Comment: Performed at [facility], [address], [place], [place] [address]  Procalcitonin     Status: None   Collection Time: 09/04/23  6:21 AM  Result Value Ref Range   Procalcitonin 4.34 ng/mL    Comment:        Interpretation: PCT > 2 ng/mL: Systemic infection (sepsis) is likely, unless other causes are known. (NOTE)       Sepsis PCT Algorithm           Lower Respiratory Tract                                      Infection PCT Algorithm    ----------------------------     ----------------------------         PCT < 0.25 ng/mL                PCT < 0.10 ng/mL          Strongly encourage             Strongly discourage   discontinuation of antibiotics    initiation of antibiotics    ----------------------------     -----------------------------       PCT 0.25 - 0.50 ng/mL            PCT 0.10 - 0.25 ng/mL               OR        >80% decrease in PCT            Discourage initiation of                                            antibiotics      Encourage discontinuation           of antibiotics    ----------------------------     -----------------------------  PCT >= 0.50 ng/mL              PCT 0.26 - 0.50 ng/mL               AND       <80% decrease in PCT              Encourage initiation of                                             antibiotics       Encourage continuation           of antibiotics    ----------------------------     -----------------------------        PCT >= 0.50 ng/mL                  PCT > 0.50 ng/mL               AND         increase in PCT                  Strongly encourage                                      initiation of antibiotics    Strongly encourage escalation           of antibiotics                                     -----------------------------                                           PCT <= 0.25 ng/mL                                                 OR                                        > 80% decrease in PCT                                      Discontinue / Do not initiate                                             antibiotics  Performed at [facility], [address] [place], [place] [address]   Magnesium     Status: Abnormal   Collection Time: 09/04/23  6:21 AM  Result Value Ref Range   Magnesium 1.5 (L) 1.7 - 2.4 mg/dL    Comment: Performed at [facility], [address] [place], [place] [address]  Brain natriuretic  peptide     Status: Abnormal   Collection Time: 09/04/23  6:21 AM  Result Value Ref Range   B Natriuretic Peptide 236.0 (H) 0.0 - 100.0 pg/mL    Comment: Performed at [facility], [address] [place], [place] [address]  Protime-INR     Status: None   Collection Time: 09/04/23  6:21 AM  Result Value Ref Range   Prothrombin Time 15.2 11.4 - 15.2 seconds   INR 1.2 0.8 - 1.2    Comment: (NOTE) INR goal  varies based on device and disease states. Performed at [facility], [address] [place], [place] [address]     MR ABDOMEN MRCP W WO CONTAST  Result Date: 09/03/2023 CLINICAL DATA:  Jaundice EXAM: MRI ABDOMEN WITHOUT AND WITH CONTRAST (INCLUDING MRCP) TECHNIQUE: Multiplanar multisequence MR imaging of the abdomen was performed both before and after the administration of intravenous contrast. Heavily T2-weighted images of the biliary and pancreatic ducts were obtained, and three-dimensional MRCP images were rendered by post processing. CONTRAST:  10mL GADAVIST GADOBUTROL 1 MMOL/ML IV SOLN COMPARISON:  Right upper quadrant ultrasound, 09/03/2023 FINDINGS: Lower chest: No acute abnormality.  Cardiomegaly. Hepatobiliary: No solid liver abnormality is seen. Choledocholithiasis, with large gallstone near the ampulla measuring 1.0 cm. Intra and extrahepatic biliary ductal dilatation, the central common bile duct measuring up to 1.2 cm. Mildly distended gallbladder with additional tiny gallstones. Pancreas: Unremarkable. No pancreatic ductal dilatation or surrounding inflammatory changes. Spleen: Mild splenomegaly, maximum span 13.5 cm. Adrenals/Urinary Tract: Adrenal glands are unremarkable. Numerous bilateral simple and thinly septated bilateral renal cortical cysts, benign, for which no further follow-up or characterization is required. Mild left hydronephrosis with abrupt caliber change at the ureteropelvic junction, suggesting chronic ureteropelvic junction stricture, appearance generally unchanged compared to prior examination. Stomach/Bowel: Stomach is within normal limits. No evidence of bowel wall thickening, distention, or inflammatory changes. Vascular/Lymphatic: No significant vascular findings are present. No enlarged abdominal lymph nodes. Other: No abdominal wall hernia or abnormality. No ascites. Musculoskeletal: No acute or significant osseous findings. IMPRESSION: 1. Choledocholithiasis,  with large gallstone near the ampulla measuring 1.0 cm. Intra and extrahepatic biliary ductal dilatation, the central common bile duct measuring up to 1.2 cm. Notably, there was a calculus in the central common bile duct on examination dated 10/06/2022, calculus on today's examination appears to be substantially larger. 2. Mildly distended gallbladder with additional tiny gallstones. 3. Mild left hydronephrosis with abrupt caliber change at the ureteropelvic junction, suggesting chronic ureteropelvic junction stricture, appearance generally unchanged compared to prior examination. Nuclear scintigraphic Lasix renogram can be considered on a nonemergent, outpatient basis to assess for functional significance if and when clinically appropriate. Electronically Signed   By: Jearld Lesch M.D.   On: 09/03/2023 21:28   US Abdomen Limited RUQ (LIVER/GB)  Result Date: 09/03/2023 CLINICAL DATA:  Right upper quadrant pain.  Mass lesion. EXAM: ULTRASOUND ABDOMEN LIMITED RIGHT UPPER QUADRANT COMPARISON:  Ultrasound 07/24/2023.  CT scan 10/06/2022 FINDINGS: Gallbladder: Distended gallbladder. Slight wall thickening measured up to 6 mm. No adjacent fluid. No shadowing stones. Common bile duct: Diameter: 10 mm, upper limits of normal for patient's age. Liver: No focal lesion identified. Within normal limits in parenchymal echogenicity. Portal vein is patent on color Doppler imaging with normal direction of blood flow towards the liver. Other: Upper pole right-sided simple appearing renal cyst measuring 5.4 cm. IMPRESSION: Distended gallbladder with slight wall thickening but no stones or stranding. Please correlate with symptoms. Further workup as clinically  appropriate. No biliary ductal dilatation. Common duct at the upper limits of normal for patient's age. Electronically Signed   By: Karen Kays M.D.   On: 09/03/2023 16:03    Review of Systems  Constitutional:  Positive for activity change and fatigue.  Eyes: Negative.    Respiratory: Negative.    Gastrointestinal:  Positive for abdominal pain.  Musculoskeletal: Negative.   Skin:  Positive for color change.  Hematological: Negative.   Psychiatric/Behavioral: Negative.     Blood pressure 137/80, pulse 67, temperature 97.6 F (36.4 C), temperature source Oral, resp. rate 17, height 5\' 2"  (1.575 m), weight 82.1 kg, SpO2 97%. Physical Exam Eyes:     General: Scleral icterus present.  Cardiovascular:     Rate and Rhythm: Normal rate.  Pulmonary:     Effort: Pulmonary effort is normal.  Abdominal:     General: Abdomen is flat. There is no distension.     Palpations: Abdomen is soft.     Tenderness: There is no abdominal tenderness. There is no guarding or rebound.     Hernia: No hernia is present.  Musculoskeletal:        General: Normal range of motion.  Skin:    Coloration: Skin is jaundiced.  Neurological:     Mental Status: She is alert.     Assessment/Plan: Choledocholithiasis with jaundice and probable mild ascending cholangitis  History of failed cholecystectomy in 2020  Agree with GI consultation and ERCP.  Unclear if surgery is possible in her case given previous failed operation.  Discussed the above with the patient  Reassess once ERCP is done and then discuss further surgical plans at a later time.  Okay from surgery standpoint to allow clear liquid diet.  Diet should be dictated by GI medicine dependent upon their procedure.  Moderate complexity  Suzanne Pu Kyrell Ruacho MD 09/04/2023, 10:54 AM

## 2023-09-04 NOTE — Plan of Care (Signed)

## 2023-09-04 NOTE — Progress Notes (Signed)
PHARMACY - PHYSICIAN COMMUNICATION CRITICAL VALUE ALERT - BLOOD CULTURE IDENTIFICATION (BCID)  Suzanne Harrison is an 71 y.o. female who presented to [facility] on 09/03/2023 with a chief complaint of elevated bilirubin  Assessment:  Patient found to have CBD stone with gallbladder distention and possible cholangitis. GI to see the patient. 2/4 Bcx with GNR, BCID with E coli and no resistance genes.  Name of physician (or Provider) Contacted: Dr. Susa Raring, MD  Current antibiotics: Zosyn 3.375g IV q8h  Changes to prescribed antibiotics recommended:  Patient is on recommended antibiotics - No changes needed  Results for orders placed or performed during the hospital encounter of 09/03/23  Blood Culture ID Panel (Reflexed) (Collected: 09/03/2023  3:54 PM)  Result Value Ref Range   Enterococcus faecalis NOT DETECTED NOT DETECTED   Enterococcus Faecium NOT DETECTED NOT DETECTED   Listeria monocytogenes NOT DETECTED NOT DETECTED   Staphylococcus species NOT DETECTED NOT DETECTED   Staphylococcus aureus (BCID) NOT DETECTED NOT DETECTED   Staphylococcus epidermidis NOT DETECTED NOT DETECTED   Staphylococcus lugdunensis NOT DETECTED NOT DETECTED   Streptococcus species NOT DETECTED NOT DETECTED   Streptococcus agalactiae NOT DETECTED NOT DETECTED   Streptococcus pneumoniae NOT DETECTED NOT DETECTED   Streptococcus pyogenes NOT DETECTED NOT DETECTED   A.calcoaceticus-baumannii NOT DETECTED NOT DETECTED   Bacteroides fragilis NOT DETECTED NOT DETECTED   Enterobacterales DETECTED (A) NOT DETECTED   Enterobacter cloacae complex NOT DETECTED NOT DETECTED   Escherichia coli DETECTED (A) NOT DETECTED   Klebsiella aerogenes NOT DETECTED NOT DETECTED   Klebsiella oxytoca NOT DETECTED NOT DETECTED   Klebsiella pneumoniae NOT DETECTED NOT DETECTED   Proteus species NOT DETECTED NOT DETECTED   Salmonella species NOT DETECTED NOT DETECTED   Serratia marcescens NOT DETECTED NOT DETECTED    Haemophilus influenzae NOT DETECTED NOT DETECTED   Neisseria meningitidis NOT DETECTED NOT DETECTED   Pseudomonas aeruginosa NOT DETECTED NOT DETECTED   Stenotrophomonas maltophilia NOT DETECTED NOT DETECTED   Candida albicans NOT DETECTED NOT DETECTED   Candida auris NOT DETECTED NOT DETECTED   Candida glabrata NOT DETECTED NOT DETECTED   Candida krusei NOT DETECTED NOT DETECTED   Candida parapsilosis NOT DETECTED NOT DETECTED   Candida tropicalis NOT DETECTED NOT DETECTED   Cryptococcus neoformans/gattii NOT DETECTED NOT DETECTED   CTX-M ESBL NOT DETECTED NOT DETECTED   Carbapenem resistance IMP NOT DETECTED NOT DETECTED   Carbapenem resistance KPC NOT DETECTED NOT DETECTED   Carbapenem resistance NDM NOT DETECTED NOT DETECTED   Carbapenem resist OXA 48 LIKE NOT DETECTED NOT DETECTED   Carbapenem resistance VIM NOT DETECTED NOT DETECTED    [name] 09/04/2023  12:08 PM

## 2023-09-04 NOTE — Consult Note (Signed)
Reason for Consult: CBD stone Referring Physician: Hospital team  [place] is an 72 y.o. female.  HPI: Patient seen and her office computer chart and her hospital computer chart was reviewed and she has been jaundice for about a week has had some pain and nausea and vomiting and her previous gallbladder issues were reviewed as well as her MRCP and she is currently doing okay without any fever and wants to drink some thing has no other complaints Past Medical History:  Diagnosis Date   Arthritis    CKD (chronic kidney disease)    Gallstones 10/2019   Hypercholesteremia    Hypertension    Loculated pleural effusion 12/2019   right    Past Surgical History:  Procedure Laterality Date   BACK SURGERY     BILIARY DRAINAGE  10/03/2019   ATTEMPTED LAPAROSCOPIC CHOLECYSTECTOMY WITH PLACEMENT OF DRAIN (N/A Abdomen)   CHOLECYSTECTOMY N/A 10/03/2019   Procedure: ATTEMPTED LAPAROSCOPIC CHOLECYSTECTOMY WITH PLACEMENT OF DRAIN;  Surgeon: Emelia Loron, MD;  Location: [facility] OR;  Service: General;  Laterality: N/A;   IR RADIOLOGIST EVAL & MGMT  11/16/2019   IR RADIOLOGIST EVAL & MGMT  12/13/2019   IR THORACENTESIS ASP PLEURAL SPACE W/IMG GUIDE  12/18/2019   REPLACEMENT TOTAL HIP W/  RESURFACING IMPLANTS      Family History  Problem Relation Age of Onset   Stroke Mother    COPD Mother    Heart disease Father    Sleep apnea Brother     Social History:  reports that she has never smoked. She has never used smokeless tobacco. She reports that she does not drink alcohol and does not use drugs.  Allergies:  Allergies  Allergen Reactions   Chlorthalidone Other (See Comments)    hyponatremia   Sulfamethazine Other (See Comments)   Sulfur Other (See Comments)    Client doesn't remember type of reaction   Gatifloxacin Other (See Comments) and Rash    Other reaction(s): Myalgias (Muscle Pain)  Rash in mouth   Sulfa Antibiotics Other (See Comments)    GI Intolerance     Medications: I have reviewed the patient's current medications.  Results for orders placed or performed during the hospital encounter of 09/03/23 (from the past 48 hour(s))  Comprehensive metabolic panel     Status: Abnormal   Collection Time: 09/03/23  1:20 PM  Result Value Ref Range   Sodium 132 (L) 135 - 145 mmol/L   Potassium 3.8 3.5 - 5.1 mmol/L   Chloride 98 98 - 111 mmol/L   CO2 23 22 - 32 mmol/L   Glucose, Bld 145 (H) 70 - 99 mg/dL    Comment: Glucose reference range applies only to samples taken after fasting for at least 8 hours.   BUN 18 8 - 23 mg/dL   Creatinine, Ser 7.82 (H) 0.44 - 1.00 mg/dL   Calcium 8.5 (L) 8.9 - 10.3 mg/dL   Total Protein 6.5 6.5 - 8.1 g/dL   Albumin 2.4 (L) 3.5 - 5.0 g/dL   AST 956 (H) 15 - 41 U/L   ALT 92 (H) 0 - 44 U/L   Alkaline Phosphatase 858 (H) 38 - 126 U/L   Total Bilirubin 9.9 (H) 0.3 - 1.2 mg/dL   GFR, Estimated 36 (L) >60 mL/min    Comment: (NOTE) Calculated using the CKD-EPI Creatinine Equation (2021)    Anion gap 11 5 - 15    Comment: Performed at [facility], [address] [place], [place]  [address]  Lipase, blood     Status: None   Collection Time: 09/03/23  1:20 PM  Result Value Ref Range   Lipase 22 11 - 51 U/L    Comment: Performed at [facility], [address], [place], [place] [address]  CBC with Diff     Status: Abnormal   Collection Time: 09/03/23  1:20 PM  Result Value Ref Range   WBC 23.8 (H) 4.0 - 10.5 K/uL   RBC 3.74 (L) 3.87 - 5.11 MIL/uL   Hemoglobin 10.6 (L) 12.0 - 15.0 g/dL   HCT 29.5 (L) 62.1 - 30.8 %   MCV 88.0 80.0 - 100.0 fL   MCH 28.3 26.0 - 34.0 pg   MCHC 32.2 30.0 - 36.0 g/dL   RDW 65.7 (H) 84.6 - 96.2 %   Platelets 211 150 - 400 K/uL   nRBC 0.0 0.0 - 0.2 %   Neutrophils Relative % 92 %   Neutro Abs 21.7 (H) 1.7 - 7.7 K/uL   Lymphocytes Relative 3 %   Lymphs Abs 0.8 0.7 - 4.0 K/uL   Monocytes Relative 4 %   Monocytes Absolute 1.1 (H) 0.1 - 1.0 K/uL   Eosinophils Relative 0  %   Eosinophils Absolute 0.0 0.0 - 0.5 K/uL   Basophils Relative 0 %   Basophils Absolute 0.0 0.0 - 0.1 K/uL   Immature Granulocytes 1 %   Abs Immature Granulocytes 0.14 (H) 0.00 - 0.07 K/uL    Comment: Performed at [facility], [address], [place], [place] [address]  Cancer antigen 19-9     Status: Abnormal   Collection Time: 09/03/23  1:20 PM  Result Value Ref Range   CA 19-9 190 (H) 0 - 35 U/mL    Comment: (NOTE) Roche Diagnostics Electrochemiluminescence Immunoassay (ECLIA) Values obtained with different assay methods or kits cannot be used interchangeably.  Results cannot be interpreted as absolute evidence of the presence or absence of malignant disease. Performed At: [facility] [address] [place], [place] [id number] Jolene Schimke MD VO:[phone]   Blood culture (routine x 2)     Status: None (Preliminary result)   Collection Time: 09/03/23  3:54 PM   Specimen: BLOOD  Result Value Ref Range   Specimen Description BLOOD BLOOD RIGHT ARM    Special Requests      BOTTLES DRAWN AEROBIC AND ANAEROBIC Blood Culture adequate volume   Culture  Setup Time      GRAM NEGATIVE RODS AEROBIC BOTTLE ONLY CRITICAL RESULT CALLED TO, READ BACK BY AND VERIFIED WITH: PHARMD Cedric Fishman [phone] AT 1018 BY EC Performed at [facility], [address], [place], [place] [address]    Culture GRAM NEGATIVE RODS    Report Status PENDING   Blood Culture ID Panel (Reflexed)     Status: Abnormal   Collection Time: 09/03/23  3:54 PM  Result Value Ref Range   Enterococcus faecalis NOT DETECTED NOT DETECTED   Enterococcus Faecium NOT DETECTED NOT DETECTED   Listeria monocytogenes NOT DETECTED NOT DETECTED   Staphylococcus species NOT DETECTED NOT DETECTED   Staphylococcus aureus (BCID) NOT DETECTED NOT DETECTED   Staphylococcus epidermidis NOT DETECTED NOT DETECTED   Staphylococcus lugdunensis NOT DETECTED NOT DETECTED   Streptococcus species NOT DETECTED NOT  DETECTED   Streptococcus agalactiae NOT DETECTED NOT DETECTED   Streptococcus pneumoniae NOT DETECTED NOT DETECTED   Streptococcus pyogenes NOT DETECTED NOT DETECTED   A.calcoaceticus-baumannii NOT DETECTED NOT DETECTED  Bacteroides fragilis NOT DETECTED NOT DETECTED   Enterobacterales DETECTED (A) NOT DETECTED    Comment: Enterobacterales represent a large order of gram negative bacteria, not a single organism. CRITICAL RESULT CALLED TO, READ BACK BY AND VERIFIED WITH: PHARMD NATHAN GOAD [phone] AT 1018 BY EC    Enterobacter cloacae complex NOT DETECTED NOT DETECTED   Escherichia coli DETECTED (A) NOT DETECTED    Comment: CRITICAL RESULT CALLED TO, READ BACK BY AND VERIFIED WITH: PHARMD NATHAN GOAD [phone] AT 1018 BY EC    Klebsiella aerogenes NOT DETECTED NOT DETECTED   Klebsiella oxytoca NOT DETECTED NOT DETECTED   Klebsiella pneumoniae NOT DETECTED NOT DETECTED   Proteus species NOT DETECTED NOT DETECTED   Salmonella species NOT DETECTED NOT DETECTED   Serratia marcescens NOT DETECTED NOT DETECTED   Haemophilus influenzae NOT DETECTED NOT DETECTED   Neisseria meningitidis NOT DETECTED NOT DETECTED   Pseudomonas aeruginosa NOT DETECTED NOT DETECTED   Stenotrophomonas maltophilia NOT DETECTED NOT DETECTED   Candida albicans NOT DETECTED NOT DETECTED   Candida auris NOT DETECTED NOT DETECTED   Candida glabrata NOT DETECTED NOT DETECTED   Candida krusei NOT DETECTED NOT DETECTED   Candida parapsilosis NOT DETECTED NOT DETECTED   Candida tropicalis NOT DETECTED NOT DETECTED   Cryptococcus neoformans/gattii NOT DETECTED NOT DETECTED   CTX-M ESBL NOT DETECTED NOT DETECTED   Carbapenem resistance IMP NOT DETECTED NOT DETECTED   Carbapenem resistance KPC NOT DETECTED NOT DETECTED   Carbapenem resistance NDM NOT DETECTED NOT DETECTED   Carbapenem resist OXA 48 LIKE NOT DETECTED NOT DETECTED   Carbapenem resistance VIM NOT DETECTED NOT DETECTED    Comment: Performed at [facility], [address] [place], [place] [address]  I-Stat CG4 Lactic Acid     Status: None   Collection Time: 09/03/23  3:55 PM  Result Value Ref Range   Lactic Acid, Venous 0.6 0.5 - 1.9 mmol/L  Blood culture (routine x 2)     Status: None (Preliminary result)   Collection Time: 09/03/23  4:00 PM   Specimen: BLOOD  Result Value Ref Range   Specimen Description BLOOD BLOOD LEFT ARM    Special Requests      BOTTLES DRAWN AEROBIC AND ANAEROBIC Blood Culture adequate volume   Culture  Setup Time      GRAM NEGATIVE RODS AEROBIC BOTTLE ONLY CRITICAL VALUE NOTED.  VALUE IS CONSISTENT WITH PREVIOUSLY REPORTED AND CALLED VALUE. Performed at [facility], [address] [place], [place] [address]    Culture GRAM NEGATIVE RODS    Report Status PENDING   Urinalysis, Routine w reflex microscopic -Urine, Clean Catch     Status: Abnormal   Collection Time: 09/03/23  5:15 PM  Result Value Ref Range   Color, Urine AMBER (A) YELLOW    Comment: BIOCHEMICALS MAY BE AFFECTED BY COLOR   APPearance HAZY (A) CLEAR   Specific Gravity, Urine 1.014 1.005 - 1.030   pH 5.0 5.0 - 8.0   Glucose, UA NEGATIVE NEGATIVE mg/dL   Hgb urine dipstick NEGATIVE NEGATIVE   Bilirubin Urine SMALL (A) NEGATIVE   Ketones, ur NEGATIVE NEGATIVE mg/dL   Protein, ur NEGATIVE NEGATIVE mg/dL   Nitrite NEGATIVE NEGATIVE   Leukocytes,Ua NEGATIVE NEGATIVE   RBC / HPF 0-5 0 - 5 RBC/hpf   WBC, UA 0-5 0 - 5 WBC/hpf   Bacteria, UA MANY (A) NONE SEEN   Squamous Epithelial / HPF 6-10 0 - 5 /HPF   Mucus  PRESENT    Hyaline Casts, UA PRESENT    Granular Casts, UA PRESENT     Comment: Performed at [facility], [address] [place], [place] [address]  Lipid panel     Status: Abnormal   Collection Time: 09/03/23  6:56 PM  Result Value Ref Range   Cholesterol 201 (H) 0 - 200 mg/dL   Triglycerides 71 <604 mg/dL   HDL 23 (L) >54 mg/dL   Total CHOL/HDL Ratio 8.7 RATIO   VLDL 14 0 - 40 mg/dL   LDL Cholesterol 098 (H)  0 - 99 mg/dL    Comment:        Total Cholesterol/HDL:CHD Risk Coronary Heart Disease Risk Table                     Men   Women  1/2 Average Risk   3.4   3.3  Average Risk       5.0   4.4  2 X Average Risk   9.6   7.1  3 X Average Risk  23.4   11.0        Use the calculated Patient Ratio above and the CHD Risk Table to determine the patient's CHD Risk.        ATP III CLASSIFICATION (LDL):  <100     mg/dL   Optimal  119-147  mg/dL   Near or Above                    Optimal  130-159  mg/dL   Borderline  829-562  mg/dL   High  >130     mg/dL   Very High Performed at [facility], [address] [place], [place] [address]   Type and screen     Status: None   Collection Time: 09/04/23  6:14 AM  Result Value Ref Range   ABO/RH(D) A POS    Antibody Screen NEG    Sample Expiration      09/07/2023,2359 Performed at [facility], [address], [place], [place] [address]   CBC with Differential/Platelet     Status: Abnormal   Collection Time: 09/04/23  6:21 AM  Result Value Ref Range   WBC 12.0 (H) 4.0 - 10.5 K/uL   RBC 3.18 (L) 3.87 - 5.11 MIL/uL   Hemoglobin 8.8 (L) 12.0 - 15.0 g/dL   HCT 95.2 (L) 84.1 - 32.4 %   MCV 85.5 80.0 - 100.0 fL   MCH 27.7 26.0 - 34.0 pg   MCHC 32.4 30.0 - 36.0 g/dL   RDW 40.1 (H) 02.7 - 25.3 %   Platelets 146 (L) 150 - 400 K/uL   nRBC 0.0 0.0 - 0.2 %   Neutrophils Relative % 83 %   Neutro Abs 10.1 (H) 1.7 - 7.7 K/uL   Lymphocytes Relative 10 %   Lymphs Abs 1.2 0.7 - 4.0 K/uL   Monocytes Relative 5 %   Monocytes Absolute 0.5 0.1 - 1.0 K/uL   Eosinophils Relative 1 %   Eosinophils Absolute 0.1 0.0 - 0.5 K/uL   Basophils Relative 0 %   Basophils Absolute 0.0 0.0 - 0.1 K/uL   Immature Granulocytes 1 %   Abs Immature Granulocytes 0.07 0.00 - 0.07 K/uL    Comment: Performed at [facility], [address], [place], [place] [address]  Comprehensive metabolic panel     Status: Abnormal   Collection Time: 09/04/23  6:21 AM  Result Value Ref Range   Sodium 135 135 - 145 mmol/L   Potassium 3.1 (L) 3.5 - 5.1 mmol/L   Chloride 102 98 - 111 mmol/L   CO2 27 22 - 32 mmol/L   Glucose, Bld 78 70 - 99 mg/dL    Comment: Glucose reference range applies only to samples taken after fasting for at least 8 hours.   BUN 16 8 - 23 mg/dL   Creatinine, Ser 9.56 (H) 0.44 - 1.00 mg/dL   Calcium 8.4 (L) 8.9 - 10.3 mg/dL   Total Protein 5.3 (L) 6.5 - 8.1 g/dL   Albumin 1.9 (L) 3.5 - 5.0 g/dL   AST 213 (H) 15 - 41 U/L   ALT 68 (H) 0 - 44 U/L   Alkaline Phosphatase 639 (H) 38 - 126 U/L   Total Bilirubin 8.1 (H) 0.3 - 1.2 mg/dL   GFR, Estimated 58 (L) >60 mL/min    Comment: (NOTE) Calculated using the CKD-EPI Creatinine Equation (2021)    Anion gap 6 5 - 15    Comment: Performed at [facility], [address], [place], [place] [address]  C-reactive protein     Status: Abnormal   Collection Time: 09/04/23  6:21 AM  Result Value Ref Range   CRP 18.7 (H) <1.0 mg/dL    Comment: Performed at [facility], [address], [place], [place] [address]  Procalcitonin     Status: None   Collection Time: 09/04/23  6:21 AM  Result Value Ref Range   Procalcitonin 4.34 ng/mL    Comment:        Interpretation: PCT > 2 ng/mL: Systemic infection (sepsis) is likely, unless other causes are known. (NOTE)       Sepsis PCT Algorithm           Lower Respiratory Tract                                      Infection PCT Algorithm    ----------------------------     ----------------------------         PCT < 0.25 ng/mL                PCT < 0.10 ng/mL          Strongly encourage             Strongly discourage   discontinuation of antibiotics    initiation of antibiotics    ----------------------------     -----------------------------       PCT 0.25 - 0.50 ng/mL            PCT 0.10 - 0.25 ng/mL               OR       >80% decrease in PCT            Discourage initiation of                                             antibiotics      Encourage discontinuation           of antibiotics    ----------------------------     -----------------------------         PCT >= 0.50 ng/mL  PCT 0.26 - 0.50 ng/mL               AND       <80% decrease in PCT              Encourage initiation of                                             antibiotics       Encourage continuation           of antibiotics    ----------------------------     -----------------------------        PCT >= 0.50 ng/mL                  PCT > 0.50 ng/mL               AND         increase in PCT                  Strongly encourage                                      initiation of antibiotics    Strongly encourage escalation           of antibiotics                                     -----------------------------                                           PCT <= 0.25 ng/mL                                                 OR                                        > 80% decrease in PCT                                      Discontinue / Do not initiate                                             antibiotics  Performed at [facility], [address], [place], [place] [address]   Magnesium     Status: Abnormal   Collection Time: 09/04/23  6:21 AM  Result Value Ref Range   Magnesium 1.5 (L) 1.7 - 2.4 mg/dL    Comment: Performed at [facility], [address], [place], [place] [address]  Brain natriuretic peptide     Status: Abnormal   Collection Time: 09/04/23  6:21 AM  Result  Value Ref Range   B Natriuretic Peptide 236.0 (H) 0.0 - 100.0 pg/mL    Comment: Performed at [facility], [address] [place], [place] [address]  Protime-INR     Status: None   Collection Time: 09/04/23  6:21 AM  Result Value Ref Range   Prothrombin Time 15.2 11.4 - 15.2 seconds   INR 1.2 0.8 - 1.2    Comment: (NOTE) INR goal varies based on device and disease states. Performed at [facility], [address]  [place], [place] [address]     MR ABDOMEN MRCP W WO CONTAST  Result Date: 09/03/2023 CLINICAL DATA:  Jaundice EXAM: MRI ABDOMEN WITHOUT AND WITH CONTRAST (INCLUDING MRCP) TECHNIQUE: Multiplanar multisequence MR imaging of the abdomen was performed both before and after the administration of intravenous contrast. Heavily T2-weighted images of the biliary and pancreatic ducts were obtained, and three-dimensional MRCP images were rendered by post processing. CONTRAST:  10mL GADAVIST GADOBUTROL 1 MMOL/ML IV SOLN COMPARISON:  Right upper quadrant ultrasound, 09/03/2023 FINDINGS: Lower chest: No acute abnormality.  Cardiomegaly. Hepatobiliary: No solid liver abnormality is seen. Choledocholithiasis, with large gallstone near the ampulla measuring 1.0 cm. Intra and extrahepatic biliary ductal dilatation, the central common bile duct measuring up to 1.2 cm. Mildly distended gallbladder with additional tiny gallstones. Pancreas: Unremarkable. No pancreatic ductal dilatation or surrounding inflammatory changes. Spleen: Mild splenomegaly, maximum span 13.5 cm. Adrenals/Urinary Tract: Adrenal glands are unremarkable. Numerous bilateral simple and thinly septated bilateral renal cortical cysts, benign, for which no further follow-up or characterization is required. Mild left hydronephrosis with abrupt caliber change at the ureteropelvic junction, suggesting chronic ureteropelvic junction stricture, appearance generally unchanged compared to prior examination. Stomach/Bowel: Stomach is within normal limits. No evidence of bowel wall thickening, distention, or inflammatory changes. Vascular/Lymphatic: No significant vascular findings are present. No enlarged abdominal lymph nodes. Other: No abdominal wall hernia or abnormality. No ascites. Musculoskeletal: No acute or significant osseous findings. IMPRESSION: 1. Choledocholithiasis, with large gallstone near the ampulla measuring 1.0 cm. Intra and extrahepatic biliary ductal  dilatation, the central common bile duct measuring up to 1.2 cm. Notably, there was a calculus in the central common bile duct on examination dated 10/06/2022, calculus on today's examination appears to be substantially larger. 2. Mildly distended gallbladder with additional tiny gallstones. 3. Mild left hydronephrosis with abrupt caliber change at the ureteropelvic junction, suggesting chronic ureteropelvic junction stricture, appearance generally unchanged compared to prior examination. Nuclear scintigraphic Lasix renogram can be considered on a nonemergent, outpatient basis to assess for functional significance if and when clinically appropriate. Electronically Signed   By: Jearld Lesch M.D.   On: 09/03/2023 21:28   US Abdomen Limited RUQ (LIVER/GB)  Result Date: 09/03/2023 CLINICAL DATA:  Right upper quadrant pain.  Mass lesion. EXAM: ULTRASOUND ABDOMEN LIMITED RIGHT UPPER QUADRANT COMPARISON:  Ultrasound 07/24/2023.  CT scan 10/06/2022 FINDINGS: Gallbladder: Distended gallbladder. Slight wall thickening measured up to 6 mm. No adjacent fluid. No shadowing stones. Common bile duct: Diameter: 10 mm, upper limits of normal for patient's age. Liver: No focal lesion identified. Within normal limits in parenchymal echogenicity. Portal vein is patent on color Doppler imaging with normal direction of blood flow towards the liver. Other: Upper pole right-sided simple appearing renal cyst measuring 5.4 cm. IMPRESSION: Distended gallbladder with slight wall thickening but no stones or stranding. Please correlate with symptoms. Further workup as clinically appropriate. No biliary ductal dilatation. Common duct at the upper limits of normal for patient's age. Electronically  Signed   By: Karen Kays M.D.   On: 09/03/2023 16:03    ROS negative except above Blood pressure 137/80, pulse 67, temperature 97.6 F (36.4 C), temperature source Oral, resp. rate 17, height 5\' 2"  (1.575 m), weight 82.1 kg, SpO2 97%. Physical  Exam patient not examined today vital signs stable will be examined tomorrow before procedure ultrasound MRCP and labs reviewed white count decreased from 23-12 hemoglobin dropped a little BUN and creatinine decreased with hydration as did bilirubin little  Assessment/Plan: CBD stone in patient with history of gallbladder problems and cholecystostomy tubes in the past Plan: I was available to proceed with ERCP today however anesthesia did not have the staff to proceed today no I have scheduled her for 730 tomorrow will allow clear liquids today but n.p.o. after midnight and case discussed with the nurse and the procedure was discussed with the patient including the risks and success rate and we will rediscuss it with her tomorrow morning preop as well  Oanh Devivo E 09/04/2023, 11:38 AM

## 2023-09-04 NOTE — Evaluation (Signed)
Physical Therapy Evaluation Patient Details Name: ARMONNI SKOGEN MRN: [id number] DOB: 06/15/1951 Today's Date: 09/04/2023  History of Present Illness  Pt is a 72 y/o female directed by her PCP to come to the ED 10/4 in the setting of her jaundice, fatigue, abdominal pain and hypotension with concern for sepsis.  PMHx:  arthritis, CKD, HTN, back surgery. Bil THA  Clinical Impression  Pt admitted with/for jaundice fatigue abdominal pain.  Presently this patient mobilizes at a CGA to supervision level.  Pt currently limited functionally due to the problems listed below.  (see problems list.)  Pt will benefit from PT to maximize function and safety to be able to get home safely with available assist .         If plan is discharge home, recommend the following: A little help with walking and/or transfers;A little help with bathing/dressing/bathroom;Assistance with cooking/housework;Assist for transportation   Can travel by private vehicle        Equipment Recommendations    Recommendations for Other Services       Functional Status Assessment Patient has had a recent decline in their functional status and demonstrates the ability to make significant improvements in function in a reasonable and predictable amount of time.     Precautions / Restrictions Precautions Precautions: Fall      Mobility  Bed Mobility Overal bed mobility: Needs Assistance Bed Mobility: Sit to Supine       Sit to supine: Contact guard assist        Transfers Overall transfer level: Needs assistance Equipment used: None Transfers: Sit to/from Stand Sit to Stand: Supervision           General transfer comment: used UE's appropriately    Ambulation/Gait Ambulation/Gait assistance: Supervision Gait Distance (Feet): 260 Feet Assistive device: Straight cane Gait Pattern/deviations: Step-through pattern   Gait velocity interpretation: 1.31 - 2.62 ft/sec, indicative of limited community  ambulator   General Gait Details: used cane apporpriately to remain steady overall.  moderate speeds, able to scan, turn abruptly without any overt deviation.  Stairs            Wheelchair Mobility     Tilt Bed    Modified Rankin (Stroke Patients Only)       Balance Overall balance assessment: Needs assistance Sitting-balance support: No upper extremity supported, Feet supported Sitting balance-Leahy Scale: Good     Standing balance support: No upper extremity supported, During functional activity Standing balance-Leahy Scale: Fair                               Pertinent Vitals/Pain Pain Assessment Pain Assessment: Faces Faces Pain Scale: Hurts little more Pain Location: back pain Pain Descriptors / Indicators: Guarding, Sore Pain Intervention(s): Monitored during session    Home Living Family/patient expects to be discharged to:: Private residence Living Arrangements: Spouse/significant other Available Help at Discharge: Family;Available 24 hours/day Type of Home: House         Home Layout: Two level;Able to live on main level with bedroom/bathroom Home Equipment: Gilmer Mor - single point      Prior Function Prior Level of Function : Independent/Modified Independent;Driving                     Extremity/Trunk Assessment   Upper Extremity Assessment Upper Extremity Assessment: Overall WFL for tasks assessed    Lower Extremity Assessment Lower Extremity Assessment: Overall WFL for tasks assessed (some proximal  weakness)    Cervical / Trunk Assessment Cervical / Trunk Assessment: Kyphotic  Communication   Communication Communication: No apparent difficulties  Cognition Arousal: Alert Behavior During Therapy: WFL for tasks assessed/performed Overall Cognitive Status: Within Functional Limits for tasks assessed                                          General Comments General comments (skin integrity, edema,  etc.): VSS on RA    Exercises     Assessment/Plan    PT Assessment Patient needs continued PT services  PT Problem List Decreased strength;Decreased activity tolerance;Decreased balance;Decreased mobility;Decreased knowledge of use of DME       PT Treatment Interventions DME instruction;Gait training;Stair training;Functional mobility training;Therapeutic activities;Balance training;Patient/family education    PT Goals (Current goals can be found in the Care Plan section)  Acute Rehab PT Goals Patient Stated Goal: none stated PT Goal Formulation: With patient Time For Goal Achievement: 09/18/23 Potential to Achieve Goals: Good    Frequency Min 1X/week     Co-evaluation               AM-PAC PT "6 Clicks" Mobility  Outcome Measure Help needed turning from your back to your side while in a flat bed without using bedrails?: A Little Help needed moving from lying on your back to sitting on the side of a flat bed without using bedrails?: A Little Help needed moving to and from a bed to a chair (including a wheelchair)?: A Little Help needed standing up from a chair using your arms (e.g., wheelchair or bedside chair)?: A Little Help needed to walk in hospital room?: A Little Help needed climbing 3-5 steps with a railing? : A Little 6 Click Score: 18    End of Session   Activity Tolerance: Patient tolerated treatment well Patient left: in bed;with call bell/phone within reach;with nursing/sitter in room Nurse Communication: Mobility status PT Visit Diagnosis: Unsteadiness on feet (R26.81);Other abnormalities of gait and mobility (R26.89)    Time: 8119-1478 PT Time Calculation (min) (ACUTE ONLY): 18 min   Charges:   PT Evaluation $PT Eval Moderate Complexity: 1 Mod   PT General Charges $$ ACUTE PT VISIT: 1 Visit         09/04/2023  Jacinto Halim., PT Acute Rehabilitation Services [phone]  (office)  Eliseo Gum Kaiesha Tonner 09/04/2023, 4:28 PM

## 2023-09-04 NOTE — Plan of Care (Signed)
  Problem: Education: Goal: Knowledge of General Education information will improve Description Including pain rating scale, medication(s)/side effects and non-pharmacologic comfort measures Outcome: Progressing   Problem: Health Behavior/Discharge Planning: Goal: Ability to manage health-related needs will improve Outcome: Progressing   Problem: Clinical Measurements: Goal: Ability to maintain clinical measurements within normal limits will improve Outcome: Progressing Goal: Will remain free from infection Outcome: Progressing   Problem: Activity: Goal: Risk for activity intolerance will decrease Outcome: Progressing   Problem: Nutrition: Goal: Adequate nutrition will be maintained Outcome: Progressing   Problem: Coping: Goal: Level of anxiety will decrease Outcome: Progressing   Problem: Elimination: Goal: Will not experience complications related to bowel motility Outcome: Progressing   Problem: Pain Managment: Goal: General experience of comfort will improve Outcome: Progressing   Problem: Safety: Goal: Ability to remain free from injury will improve Outcome: Progressing   Problem: Skin Integrity: Goal: Risk for impaired skin integrity will decrease Outcome: Progressing   

## 2023-09-05 ENCOUNTER — Inpatient Hospital Stay (HOSPITAL_COMMUNITY): Payer: 59 | Admitting: Certified Registered Nurse Anesthetist

## 2023-09-05 ENCOUNTER — Encounter (HOSPITAL_COMMUNITY)
Admission: EM | Disposition: A | Discharge status: Home / Self Care | Payer: Self-pay | Source: Home / Self Care | Attending: Internal Medicine

## 2023-09-05 ENCOUNTER — Inpatient Hospital Stay (HOSPITAL_COMMUNITY): Payer: 59

## 2023-09-05 ENCOUNTER — Encounter (HOSPITAL_COMMUNITY): Payer: Self-pay | Admitting: Family Medicine

## 2023-09-05 DIAGNOSIS — E039 Hypothyroidism, unspecified: Secondary | ICD-10-CM

## 2023-09-05 DIAGNOSIS — R17 Unspecified jaundice: Secondary | ICD-10-CM | POA: Diagnosis not present

## 2023-09-05 DIAGNOSIS — K8050 Calculus of bile duct without cholangitis or cholecystitis without obstruction: Secondary | ICD-10-CM | POA: Diagnosis not present

## 2023-09-05 HISTORY — PX: ERCP: SHX5425

## 2023-09-05 HISTORY — PX: REMOVAL OF STONES: SHX5545

## 2023-09-05 HISTORY — PX: SPHINCTEROTOMY: SHX5544

## 2023-09-05 LAB — CBC WITH DIFFERENTIAL/PLATELET
Abs Immature Granulocytes: 0.02 10*3/uL (ref 0.00–0.07)
Basophils Absolute: 0 10*3/uL (ref 0.0–0.1)
Basophils Relative: 0 %
Eosinophils Absolute: 0.2 10*3/uL (ref 0.0–0.5)
Eosinophils Relative: 3 %
HCT: 29.3 % — ABNORMAL LOW (ref 36.0–46.0)
Hemoglobin: 9.5 g/dL — ABNORMAL LOW (ref 12.0–15.0)
Immature Granulocytes: 0 %
Lymphocytes Relative: 20 %
Lymphs Abs: 1.4 10*3/uL (ref 0.7–4.0)
MCH: 27.6 pg (ref 26.0–34.0)
MCHC: 32.4 g/dL (ref 30.0–36.0)
MCV: 85.2 fL (ref 80.0–100.0)
Monocytes Absolute: 0.4 10*3/uL (ref 0.1–1.0)
Monocytes Relative: 7 %
Neutro Abs: 4.7 10*3/uL (ref 1.7–7.7)
Neutrophils Relative %: 70 %
Platelets: 158 10*3/uL (ref 150–400)
RBC: 3.44 MIL/uL — ABNORMAL LOW (ref 3.87–5.11)
RDW: 17.3 % — ABNORMAL HIGH (ref 11.5–15.5)
WBC: 6.8 10*3/uL (ref 4.0–10.5)
nRBC: 0 % (ref 0.0–0.2)

## 2023-09-05 LAB — COMPREHENSIVE METABOLIC PANEL
ALT: 58 U/L — ABNORMAL HIGH (ref 0–44)
AST: 70 U/L — ABNORMAL HIGH (ref 15–41)
Albumin: 2.1 g/dL — ABNORMAL LOW (ref 3.5–5.0)
Alkaline Phosphatase: 652 U/L — ABNORMAL HIGH (ref 38–126)
Anion gap: 9 (ref 5–15)
BUN: 8 mg/dL (ref 8–23)
CO2: 25 mmol/L (ref 22–32)
Calcium: 8.5 mg/dL — ABNORMAL LOW (ref 8.9–10.3)
Chloride: 101 mmol/L (ref 98–111)
Creatinine, Ser: 0.91 mg/dL (ref 0.44–1.00)
GFR, Estimated: >60 mL/min (ref >60)
Glucose, Bld: 82 mg/dL (ref 70–99)
Potassium: 3.2 mmol/L — ABNORMAL LOW (ref 3.5–5.1)
Sodium: 135 mmol/L (ref 135–145)
Total Bilirubin: 5.2 mg/dL — ABNORMAL HIGH (ref 0.3–1.2)
Total Protein: 5.8 g/dL — ABNORMAL LOW (ref 6.5–8.1)

## 2023-09-05 LAB — BRAIN NATRIURETIC PEPTIDE: B Natriuretic Peptide: 606.2 pg/mL — ABNORMAL HIGH (ref 0.0–100.0)

## 2023-09-05 LAB — C-REACTIVE PROTEIN: CRP: 18.8 mg/dL — ABNORMAL HIGH (ref <1.0)

## 2023-09-05 LAB — MAGNESIUM: Magnesium: 1.9 mg/dL (ref 1.7–2.4)

## 2023-09-05 LAB — PROCALCITONIN: Procalcitonin: 1.82 ng/mL

## 2023-09-05 LAB — TSH: TSH: 5.681 u[IU]/mL — ABNORMAL HIGH (ref 0.350–4.500)

## 2023-09-05 SURGERY — ERCP, WITH INTERVENTION IF INDICATED
Anesthesia: General

## 2023-09-05 MED ORDER — FENTANYL CITRATE (PF) 100 MCG/2ML IJ SOLN: 25.0000 ug | INTRAMUSCULAR | Status: DC | PRN

## 2023-09-05 MED ORDER — AMISULPRIDE (ANTIEMETIC) 5 MG/2ML IV SOLN
INTRAVENOUS | Status: AC
  Filled 2023-09-05: qty 4

## 2023-09-05 MED ORDER — DEXAMETHASONE SODIUM PHOSPHATE 10 MG/ML IJ SOLN
INTRAMUSCULAR | Status: DC | PRN
  Administered 2023-09-05: 8 mg via INTRAVENOUS

## 2023-09-05 MED ORDER — PROPOFOL 10 MG/ML IV BOLUS
INTRAVENOUS | Status: DC | PRN
  Administered 2023-09-05: 50 mg via INTRAVENOUS
  Administered 2023-09-05: 100 mg via INTRAVENOUS

## 2023-09-05 MED ORDER — ROCURONIUM BROMIDE 10 MG/ML (PF) SYRINGE
PREFILLED_SYRINGE | INTRAVENOUS | Status: DC | PRN
  Administered 2023-09-05: 50 mg via INTRAVENOUS

## 2023-09-05 MED ORDER — FENTANYL CITRATE (PF) 100 MCG/2ML IJ SOLN
INTRAMUSCULAR | Status: DC | PRN
Start: 2023-09-05 — End: 2023-09-05
  Administered 2023-09-05 (×2): 50 ug via INTRAVENOUS

## 2023-09-05 MED ORDER — LEVOTHYROXINE SODIUM 50 MCG PO TABS
50.0000 ug | ORAL_TABLET | Freq: Every day | ORAL | Status: DC
  Administered 2023-09-05: 50 ug via ORAL
  Filled 2023-09-05: qty 1

## 2023-09-05 MED ORDER — LIDOCAINE HCL (CARDIAC) PF 100 MG/5ML IV SOSY
PREFILLED_SYRINGE | INTRAVENOUS | Status: DC | PRN
  Administered 2023-09-05: 60 mg via INTRAVENOUS

## 2023-09-05 MED ORDER — ONDANSETRON HCL 4 MG/2ML IJ SOLN: 4.0000 mg | Freq: Once | INTRAMUSCULAR | Status: DC | PRN

## 2023-09-05 MED ORDER — CIPROFLOXACIN IN D5W 400 MG/200ML IV SOLN
INTRAVENOUS | Status: AC
  Filled 2023-09-05: qty 200

## 2023-09-05 MED ORDER — SODIUM CHLORIDE 0.9 % IV SOLN
INTRAVENOUS | Status: DC | PRN
  Administered 2023-09-05: 30 mL

## 2023-09-05 MED ORDER — SODIUM CHLORIDE 0.9 % IV SOLN: INTRAVENOUS | Status: DC

## 2023-09-05 MED ORDER — DICLOFENAC SUPPOSITORY 100 MG
RECTAL | Status: DC | PRN
  Administered 2023-09-05: 100 mg via RECTAL

## 2023-09-05 MED ORDER — DICLOFENAC SUPPOSITORY 100 MG
RECTAL | Status: AC
  Filled 2023-09-05: qty 1

## 2023-09-05 MED ORDER — AMISULPRIDE (ANTIEMETIC) 5 MG/2ML IV SOLN
10.0000 mg | Freq: Once | INTRAVENOUS | Status: AC | PRN
  Administered 2023-09-05: 10 mg via INTRAVENOUS

## 2023-09-05 MED ORDER — GLUCAGON HCL RDNA (DIAGNOSTIC) 1 MG IJ SOLR
INTRAMUSCULAR | Status: AC
  Filled 2023-09-05: qty 1

## 2023-09-05 MED ORDER — PHENYLEPHRINE 80 MCG/ML (10ML) SYRINGE FOR IV PUSH (FOR BLOOD PRESSURE SUPPORT)
PREFILLED_SYRINGE | INTRAVENOUS | Status: DC | PRN
  Administered 2023-09-05: 80 ug via INTRAVENOUS

## 2023-09-05 MED ORDER — AMLODIPINE BESYLATE 10 MG PO TABS
10.0000 mg | ORAL_TABLET | Freq: Every day | ORAL | Status: DC
  Administered 2023-09-05 – 2023-09-07 (×3): 10 mg via ORAL
  Filled 2023-09-05 (×3): qty 1

## 2023-09-05 MED ORDER — TRAMADOL HCL 50 MG PO TABS
50.0000 mg | ORAL_TABLET | Freq: Four times a day (QID) | ORAL | Status: DC | PRN
  Administered 2023-09-05: 50 mg via ORAL
  Filled 2023-09-05 (×2): qty 1

## 2023-09-05 MED ORDER — POTASSIUM CHLORIDE 10 MEQ/100ML IV SOLN
10.0000 meq | INTRAVENOUS | Status: AC
  Administered 2023-09-05 (×4): 10 meq via INTRAVENOUS
  Filled 2023-09-05 (×4): qty 100

## 2023-09-05 MED ORDER — PREGABALIN 75 MG PO CAPS
150.0000 mg | ORAL_CAPSULE | Freq: Two times a day (BID) | ORAL | Status: DC
  Administered 2023-09-05 – 2023-09-07 (×4): 150 mg via ORAL
  Filled 2023-09-05: qty 2
  Filled 2023-09-05: qty 6
  Filled 2023-09-05 (×2): qty 2

## 2023-09-05 MED ORDER — DEXTROSE 5 % IV SOLN: INTRAVENOUS | Status: AC

## 2023-09-05 MED ORDER — SUCCINYLCHOLINE CHLORIDE 200 MG/10ML IV SOSY
PREFILLED_SYRINGE | INTRAVENOUS | Status: DC | PRN
Start: 2023-09-05 — End: 2023-09-05
  Administered 2023-09-05: 80 mg via INTRAVENOUS

## 2023-09-05 MED ORDER — ROSUVASTATIN CALCIUM 5 MG PO TABS
10.0000 mg | ORAL_TABLET | Freq: Every day | ORAL | Status: DC
  Administered 2023-09-05 – 2023-09-06 (×2): 10 mg via ORAL
  Filled 2023-09-05 (×2): qty 2

## 2023-09-05 MED ORDER — FENTANYL CITRATE (PF) 100 MCG/2ML IJ SOLN
INTRAMUSCULAR | Status: AC
  Filled 2023-09-05: qty 2

## 2023-09-05 MED ORDER — LEVOTHYROXINE SODIUM 50 MCG PO TABS: 50.0000 ug | ORAL_TABLET | Freq: Every day | ORAL | Status: DC

## 2023-09-05 MED ORDER — ONDANSETRON HCL 4 MG/2ML IJ SOLN
INTRAMUSCULAR | Status: DC | PRN
  Administered 2023-09-05: 4 mg via INTRAVENOUS

## 2023-09-05 NOTE — Anesthesia Postprocedure Evaluation (Signed)
Anesthesia Post Note  Patient: Suzanne Harrison  Procedure(s) Performed: ENDOSCOPIC RETROGRADE CHOLANGIOPANCREATOGRAPHY (ERCP) SPHINCTEROTOMY REMOVAL OF STONES     Patient location during evaluation: PACU Anesthesia Type: General Level of consciousness: awake Pain management: pain level controlled Vital Signs Assessment: post-procedure vital signs reviewed and stable Respiratory status: spontaneous breathing, nonlabored ventilation and respiratory function stable Cardiovascular status: blood pressure returned to baseline and stable Postop Assessment: no apparent nausea or vomiting Anesthetic complications: no   No notable events documented.  Last Vitals:  Vitals:   09/05/23 1200 09/05/23 1219  BP:  (!) 140/66  Pulse:  (!) 53  Resp: 18 15  Temp:  (!) 36.4 C  SpO2:  95%    Last Pain:  Vitals:   09/05/23 1419  TempSrc:   PainSc: 5                  [name]

## 2023-09-05 NOTE — Progress Notes (Signed)
Suzanne Harrison 7:41 AM  Subjective: Patient seen and examined and no new complaints tolerated clear liquids and we rediscussed the procedure and all of her questions  Objective: Vital signs stable afebrile no acute distress exam please see preassessment evaluation bilirubin transaminases decreased alk phos increased just a little white count improved  Assessment: CBD stone on MRCP  Plan: The risk benefits methods and success rate of ERCP was rediscussed and we will proceed today with anesthesia assistance  [facility]  office [phone] After 5PM or if no answer call [phone]

## 2023-09-05 NOTE — Progress Notes (Signed)
PROGRESS NOTE                                                                                                                                                                                                             Patient Demographics:    Suzanne Harrison, is a 72 y.o. female, DOB - 1951-01-10, NGE:[id number]  Outpatient Primary MD for the patient is Maurice Small, MD (Inactive)    LOS - 2  Admit date - 09/03/2023    Chief Complaint  Patient presents with   Hypotension       Brief Narrative (HPI from H&P)   72 y.o. female with medical history significant of CKD, gallstones, HTN, dyslipidemia, ischemic colitis, cholecystitis s/p perc cholecystostomy tube (10/04/2019) and other medical issues who presented with abdominal pain.  Further workup suggested that she had a CBD stone with gallbladder distention.   Subjective:   Patient in bed, appears comfortable, denies any headache, no fever, no chest pain or pressure, no shortness of breath , improved abdominal pain. No new focal weakness.   Assessment  & Plan :     Right upper quadrant abdominal Pain  Jaundice  Elevated Liver Enzymes, History Cholecystitis s/p Perc Chole Tube (10/2019) - now has CBD stone with gallbladder distention.  Possible mild ascending cholangitis, sepsis with gram-negative bacteremia. She is being treated conservatively for now, bowel rest, IV fluids, empiric IV fluids, follow final culture and sensitivity, clinically better, GI and CCS on board, ERCP due 8 07/06/2023.  Continue to monitor, clinically improved.   Acute Kidney Injury Follow with IVF, improving   Hypokalemia and hypomagnesemia.  Replaced again on 09/05/2023.    Prolonged Qtc Caution with QT prolonging meds, replace electrolytes.   Hypertension Hold lisinopril and lasix for now Atenolol continued   Depression  Wellbutrin, celexa   Hypothyroidism  Synthroid  GERD PPI    Chronic Pain Lyrica  Prn oxycodone   Overactive Bladder Detrol LA  Dyslipidemia Statin on hold with abnormal LFT's   Obesity Body mass index is 33.1 kg/m.  Follow-up with PCP for weight loss.         Condition - Guarded  Family Communication  :  husband Loraine Leriche [phone] on 09/04/2023  Code Status :  Full  Consults  :  GI, CCS  PUD Prophylaxis : PPI   Procedures  :  MRCP - 1. Choledocholithiasis, with large gallstone near the ampulla measuring 1.0 cm. Intra and extrahepatic biliary ductal dilatation, the central common bile duct measuring up to 1.2 cm. Notably, there was a calculus in the central common bile duct on examination dated 10/06/2022, calculus on today's examination appears to be substantially larger. 2. Mildly distended gallbladder with additional tiny gallstones. 3. Mild left hydronephrosis with abrupt caliber change at the ureteropelvic junction, suggesting chronic ureteropelvic junction stricture, appearance generally unchanged compared to prior examination. Nuclear scintigraphic Lasix renogram can be considered on a nonemergent, outpatient basis to assess for functional significance if and when clinically appropriate.       Disposition Plan  :    Status is: Inpatient   DVT Prophylaxis  :  Heparin    Lab Results  Component Value Date   PLT 158 09/05/2023    Diet :  Diet Order             Diet NPO time specified Except for: Sips with Meds  Diet effective midnight           Diet NPO time specified  Diet effective now                    Inpatient Medications  Scheduled Meds:  [MAR Hold] heparin injection (subcutaneous)  5,000 Units Subcutaneous Q8H   [MAR Hold] metoprolol tartrate  25 mg Oral BID   [MAR Hold] pantoprazole (PROTONIX) IV  40 mg Intravenous Q24H   Continuous Infusions:  sodium chloride     [MAR Hold] cefTRIAXone (ROCEPHIN)  IV 2 g (09/05/23 0542)   dextrose 75 mL/hr at 09/05/23 0641   lactated ringers 75 mL/hr at  09/05/23 0743   potassium chloride     PRN Meds:.[MAR Hold] hydrALAZINE, [MAR Hold]  HYDROmorphone (DILAUDID) injection, [MAR Hold] loperamide, [MAR Hold] metoprolol tartrate, [MAR Hold] naLOXone (NARCAN)  injection    Objective:   Vitals:   09/04/23 1950 09/04/23 1955 09/04/23 2015 09/05/23 0734  BP:   (!) 167/82 (!) 153/84  Pulse:   65 60  Resp: 18 18 16 14   Temp:   (!) 96.8 F (36 C) (!) 97.1 F (36.2 C)  TempSrc:   Oral Temporal  SpO2:   99% 98%  Weight:    82.1 kg  Height:    5\' 2"  (1.575 m)    Wt Readings from Last 3 Encounters:  09/05/23 82.1 kg  07/24/23 82.1 kg  10/10/22 82.1 kg     Intake/Output Summary (Last 24 hours) at 09/05/2023 0815 Last data filed at 09/04/2023 1500 Gross per 24 hour  Intake 471.25 ml  Output --  Net 471.25 ml     Physical Exam  Awake Alert, No new F.N deficits, Normal affect [place].AT,PERRAL Supple Neck, No JVD,   Symmetrical Chest wall movement, Good air movement bilaterally, CTAB RRR,No Gallops,Rubs or new Murmurs,  +ve B.Sounds, Abd Soft, mild ruq tenderness,   No Cyanosis, Clubbing or edema     Data Review:    Recent Labs  Lab 09/03/23 1320 09/04/23 0621 09/05/23 0620  WBC 23.8* 12.0* 6.8  HGB 10.6* 8.8* 9.5*  HCT 32.9* 27.2* 29.3*  PLT 211 146* 158  MCV 88.0 85.5 85.2  MCH 28.3 27.7 27.6  MCHC 32.2 32.4 32.4  RDW 17.3* 17.2* 17.3*  LYMPHSABS 0.8 1.2 1.4  MONOABS 1.1* 0.5 0.4  EOSABS 0.0 0.1 0.2  BASOSABS 0.0 0.0 0.0    Recent Labs  Lab 09/03/23 1320 09/03/23 1555 09/04/23 6440  09/05/23 0620  NA 132*  --  135 135  K 3.8  --  3.1* 3.2*  CL 98  --  102 101  CO2 23  --  27 25  ANIONGAP 11  --  6 9  GLUCOSE 145*  --  78 82  BUN 18  --  16 8  CREATININE 1.54*  --  1.04* 0.91  AST 164*  --  110* 70*  ALT 92*  --  68* 58*  ALKPHOS 858*  --  639* 652*  BILITOT 9.9*  --  8.1* 5.2*  ALBUMIN 2.4*  --  1.9* 2.1*  CRP  --   --  18.7* 18.8*  PROCALCITON  --   --  4.34 1.82  LATICACIDVEN  --  0.6  --   --    INR  --   --  1.2  --   BNP  --   --  236.0* 606.2*  MG  --   --  1.5* 1.9  CALCIUM 8.5*  --  8.4* 8.5*      Recent Labs  Lab 09/03/23 1320 09/03/23 1555 09/04/23 0621 09/05/23 0620  CRP  --   --  18.7* 18.8*  PROCALCITON  --   --  4.34 1.82  LATICACIDVEN  --  0.6  --   --   INR  --   --  1.2  --   BNP  --   --  236.0* 606.2*  MG  --   --  1.5* 1.9  CALCIUM 8.5*  --  8.4* 8.5*    --------------------------------------------------------------------------------------------------------------- Lab Results  Component Value Date   CHOL 201 (H) 09/03/2023   HDL 23 (L) 09/03/2023   LDLCALC 164 (H) 09/03/2023   TRIG 71 09/03/2023   CHOLHDL 8.7 09/03/2023    No results found for: "HGBA1C" No results for input(s): "TSH", "T4TOTAL", "FREET4", "T3FREE", "THYROIDAB" in the last 72 hours. No results for input(s): "VITAMINB12", "FOLATE", "FERRITIN", "TIBC", "IRON", "RETICCTPCT" in the last 72 hours. ------------------------------------------------------------------------------------------------------------------ Cardiac Enzymes No results for input(s): "CKMB", "TROPONINI", "MYOGLOBIN" in the last 168 hours.  Invalid input(s): "CK"  Micro Results Recent Results (from the past 240 hour(s))  Blood culture (routine x 2)     Status: None (Preliminary result)   Collection Time: 09/03/23  3:54 PM   Specimen: BLOOD  Result Value Ref Range Status   Specimen Description BLOOD BLOOD RIGHT ARM  Final   Special Requests   Final    BOTTLES DRAWN AEROBIC AND ANAEROBIC Blood Culture adequate volume   Culture  Setup Time   Final    GRAM NEGATIVE RODS AEROBIC BOTTLE ONLY CRITICAL RESULT CALLED TO, READ BACK BY AND VERIFIED WITH: PHARMD Cedric Fishman [id number] AT 1018 BY EC Performed at [facility], [address], [place], [place] [address]    Culture GRAM NEGATIVE RODS  Final   Report Status PENDING  Incomplete  Blood Culture ID Panel (Reflexed)     Status: Abnormal   Collection Time:  09/03/23  3:54 PM  Result Value Ref Range Status   Enterococcus faecalis NOT DETECTED NOT DETECTED Final   Enterococcus Faecium NOT DETECTED NOT DETECTED Final   Listeria monocytogenes NOT DETECTED NOT DETECTED Final   Staphylococcus species NOT DETECTED NOT DETECTED Final   Staphylococcus aureus (BCID) NOT DETECTED NOT DETECTED Final   Staphylococcus epidermidis NOT DETECTED NOT DETECTED Final   Staphylococcus lugdunensis NOT DETECTED NOT DETECTED Final   Streptococcus species NOT DETECTED NOT DETECTED Final  Streptococcus agalactiae NOT DETECTED NOT DETECTED Final   Streptococcus pneumoniae NOT DETECTED NOT DETECTED Final   Streptococcus pyogenes NOT DETECTED NOT DETECTED Final   A.calcoaceticus-baumannii NOT DETECTED NOT DETECTED Final   Bacteroides fragilis NOT DETECTED NOT DETECTED Final   Enterobacterales DETECTED (A) NOT DETECTED Final    Comment: Enterobacterales represent a large order of gram negative bacteria, not a single organism. CRITICAL RESULT CALLED TO, READ BACK BY AND VERIFIED WITH: PHARMD NATHAN GOAD [id number] AT 1018 BY EC    Enterobacter cloacae complex NOT DETECTED NOT DETECTED Final   Escherichia coli DETECTED (A) NOT DETECTED Final    Comment: CRITICAL RESULT CALLED TO, READ BACK BY AND VERIFIED WITH: PHARMD NATHAN GOAD [id number] AT 1018 BY EC    Klebsiella aerogenes NOT DETECTED NOT DETECTED Final   Klebsiella oxytoca NOT DETECTED NOT DETECTED Final   Klebsiella pneumoniae NOT DETECTED NOT DETECTED Final   Proteus species NOT DETECTED NOT DETECTED Final   Salmonella species NOT DETECTED NOT DETECTED Final   Serratia marcescens NOT DETECTED NOT DETECTED Final   Haemophilus influenzae NOT DETECTED NOT DETECTED Final   Neisseria meningitidis NOT DETECTED NOT DETECTED Final   Pseudomonas aeruginosa NOT DETECTED NOT DETECTED Final   Stenotrophomonas maltophilia NOT DETECTED NOT DETECTED Final   Candida albicans NOT DETECTED NOT DETECTED Final   Candida auris  NOT DETECTED NOT DETECTED Final   Candida glabrata NOT DETECTED NOT DETECTED Final   Candida krusei NOT DETECTED NOT DETECTED Final   Candida parapsilosis NOT DETECTED NOT DETECTED Final   Candida tropicalis NOT DETECTED NOT DETECTED Final   Cryptococcus neoformans/gattii NOT DETECTED NOT DETECTED Final   CTX-M ESBL NOT DETECTED NOT DETECTED Final   Carbapenem resistance IMP NOT DETECTED NOT DETECTED Final   Carbapenem resistance KPC NOT DETECTED NOT DETECTED Final   Carbapenem resistance NDM NOT DETECTED NOT DETECTED Final   Carbapenem resist OXA 48 LIKE NOT DETECTED NOT DETECTED Final   Carbapenem resistance VIM NOT DETECTED NOT DETECTED Final    Comment: Performed at [facility], [address], [place], [place] [address]  Blood culture (routine x 2)     Status: None (Preliminary result)   Collection Time: 09/03/23  4:00 PM   Specimen: BLOOD  Result Value Ref Range Status   Specimen Description BLOOD BLOOD LEFT ARM  Final   Special Requests   Final    BOTTLES DRAWN AEROBIC AND ANAEROBIC Blood Culture adequate volume   Culture  Setup Time   Final    GRAM NEGATIVE RODS AEROBIC BOTTLE ONLY CRITICAL VALUE NOTED.  VALUE IS CONSISTENT WITH PREVIOUSLY REPORTED AND CALLED VALUE. Performed at [facility], [address], [place], [place] [address]    Culture GRAM NEGATIVE RODS  Final   Report Status PENDING  Incomplete    Radiology Reports MR ABDOMEN MRCP W WO CONTAST  Result Date: 09/03/2023 CLINICAL DATA:  Jaundice EXAM: MRI ABDOMEN WITHOUT AND WITH CONTRAST (INCLUDING MRCP) TECHNIQUE: Multiplanar multisequence MR imaging of the abdomen was performed both before and after the administration of intravenous contrast. Heavily T2-weighted images of the biliary and pancreatic ducts were obtained, and three-dimensional MRCP images were rendered by post processing. CONTRAST:  10mL GADAVIST GADOBUTROL 1 MMOL/ML IV SOLN COMPARISON:  Right upper quadrant ultrasound, 09/03/2023  FINDINGS: Lower chest: No acute abnormality.  Cardiomegaly. Hepatobiliary: No solid liver abnormality is seen. Choledocholithiasis, with large gallstone near the ampulla measuring 1.0 cm. Intra and extrahepatic biliary ductal dilatation, the central common bile  duct measuring up to 1.2 cm. Mildly distended gallbladder with additional tiny gallstones. Pancreas: Unremarkable. No pancreatic ductal dilatation or surrounding inflammatory changes. Spleen: Mild splenomegaly, maximum span 13.5 cm. Adrenals/Urinary Tract: Adrenal glands are unremarkable. Numerous bilateral simple and thinly septated bilateral renal cortical cysts, benign, for which no further follow-up or characterization is required. Mild left hydronephrosis with abrupt caliber change at the ureteropelvic junction, suggesting chronic ureteropelvic junction stricture, appearance generally unchanged compared to prior examination. Stomach/Bowel: Stomach is within normal limits. No evidence of bowel wall thickening, distention, or inflammatory changes. Vascular/Lymphatic: No significant vascular findings are present. No enlarged abdominal lymph nodes. Other: No abdominal wall hernia or abnormality. No ascites. Musculoskeletal: No acute or significant osseous findings. IMPRESSION: 1. Choledocholithiasis, with large gallstone near the ampulla measuring 1.0 cm. Intra and extrahepatic biliary ductal dilatation, the central common bile duct measuring up to 1.2 cm. Notably, there was a calculus in the central common bile duct on examination dated 10/06/2022, calculus on today's examination appears to be substantially larger. 2. Mildly distended gallbladder with additional tiny gallstones. 3. Mild left hydronephrosis with abrupt caliber change at the ureteropelvic junction, suggesting chronic ureteropelvic junction stricture, appearance generally unchanged compared to prior examination. Nuclear scintigraphic Lasix renogram can be considered on a nonemergent,  outpatient basis to assess for functional significance if and when clinically appropriate. Electronically Signed   By: Jearld Lesch M.D.   On: 09/03/2023 21:28   US Abdomen Limited RUQ (LIVER/GB)  Result Date: 09/03/2023 CLINICAL DATA:  Right upper quadrant pain.  Mass lesion. EXAM: ULTRASOUND ABDOMEN LIMITED RIGHT UPPER QUADRANT COMPARISON:  Ultrasound 07/24/2023.  CT scan 10/06/2022 FINDINGS: Gallbladder: Distended gallbladder. Slight wall thickening measured up to 6 mm. No adjacent fluid. No shadowing stones. Common bile duct: Diameter: 10 mm, upper limits of normal for patient's age. Liver: No focal lesion identified. Within normal limits in parenchymal echogenicity. Portal vein is patent on color Doppler imaging with normal direction of blood flow towards the liver. Other: Upper pole right-sided simple appearing renal cyst measuring 5.4 cm. IMPRESSION: Distended gallbladder with slight wall thickening but no stones or stranding. Please correlate with symptoms. Further workup as clinically appropriate. No biliary ductal dilatation. Common duct at the upper limits of normal for patient's age. Electronically Signed   By: Karen Kays M.D.   On: 09/03/2023 16:03      Signature  -   Susa Raring M.D on 09/05/2023 at 8:15 AM   -  To page go to [email or web address]

## 2023-09-05 NOTE — Anesthesia Procedure Notes (Signed)
Procedure Name: Intubation Date/Time: 09/05/2023 7:56 AM  Performed by: Yolonda Kida, CRNAPre-anesthesia Checklist: Patient identified, Emergency Drugs available, Suction available and Patient being monitored Patient Re-evaluated:Patient Re-evaluated prior to induction Oxygen Delivery Method: Circle System Utilized Preoxygenation: Pre-oxygenation with 100% oxygen Induction Type: IV induction and Rapid sequence Laryngoscope Size: Mac and 3 Grade View: Grade II Tube type: Oral Tube size: 7.0 mm Number of attempts: 1 Airway Equipment and Method: Stylet Placement Confirmation: ETT inserted through vocal cords under direct vision, positive ETCO2 and breath sounds checked- equal and bilateral Secured at: 21 cm Tube secured with: Tape Dental Injury: Teeth and Oropharynx as per pre-operative assessment

## 2023-09-05 NOTE — Progress Notes (Signed)
[facility] night cross cover note:   I was notified by RN of the patient's request for resumption of her home Lyrica 150 mg p.o. twice daily, her home rosuvastatin 10 mg p.o. daily, as well as her home Synthroid 50 mcg, with the patient reporting that she takes her Synthroid at night.  I subsequently placed orders to resume these 3 report home medications.    Newton Pigg, DO Hospitalist

## 2023-09-05 NOTE — Anesthesia Preprocedure Evaluation (Addendum)
Anesthesia Evaluation  Patient identified by MRN, date of birth, ID band Patient awake    Reviewed: Allergy & Precautions, NPO status , Patient's Chart, lab work & pertinent test results  Airway Mallampati: II       Dental no notable dental hx.    Pulmonary neg pulmonary ROS   Pulmonary exam normal        Cardiovascular hypertension, Pt. on home beta blockers and Pt. on medications Normal cardiovascular exam     Neuro/Psych   Anxiety     negative neurological ROS     GI/Hepatic negative GI ROS,,,Jaundice   Endo/Other  Hypothyroidism    Renal/GU Renal disease     Musculoskeletal  (+) Arthritis ,    Abdominal  (+) + obese  Peds  Hematology  (+) Blood dyscrasia, anemia   Anesthesia Other Findings CBD stones  Reproductive/Obstetrics                             Anesthesia Physical Anesthesia Plan  ASA: 3  Anesthesia Plan: General   Post-op Pain Management:    Induction: Intravenous and Rapid sequence  PONV Risk Score and Plan: 3 and Ondansetron, Dexamethasone and Treatment may vary due to age or medical condition  Airway Management Planned: Oral ETT  Additional Equipment:   Intra-op Plan:   Post-operative Plan: Extubation in OR  Informed Consent: I have reviewed the patients History and Physical, chart, labs and discussed the procedure including the risks, benefits and alternatives for the proposed anesthesia with the patient or authorized representative who has indicated his/her understanding and acceptance.     Dental advisory given  Plan Discussed with: CRNA  Anesthesia Plan Comments:        Anesthesia Quick Evaluation

## 2023-09-05 NOTE — Plan of Care (Signed)
  Problem: Health Behavior/Discharge Planning: Goal: Ability to manage health-related needs will improve Outcome: Progressing   Problem: Clinical Measurements: Goal: Ability to maintain clinical measurements within normal limits will improve Outcome: Progressing   Problem: Nutrition: Goal: Adequate nutrition will be maintained Outcome: Progressing   Problem: Pain Managment: Goal: General experience of comfort will improve Outcome: Progressing   

## 2023-09-05 NOTE — Transfer of Care (Signed)
Immediate Anesthesia Transfer of Care Note  Patient: Suzanne Harrison  Procedure(s) Performed: ENDOSCOPIC RETROGRADE CHOLANGIOPANCREATOGRAPHY (ERCP) SPHINCTEROTOMY REMOVAL OF STONES  Patient Location: PACU  Anesthesia Type:General  Level of Consciousness: awake, alert , oriented, and patient cooperative  Airway & Oxygen Therapy: Patient Spontanous Breathing  Post-op Assessment: Report given to RN and Post -op Vital signs reviewed and stable  Post vital signs: Reviewed and stable  Last Vitals:  Vitals Value Taken Time  BP 157/98 09/05/23 0909  Temp 36.7 C 09/05/23 0909  Pulse 64 09/05/23 0913  Resp 17 09/05/23 0913  SpO2 95 % 09/05/23 0913  Vitals shown include unfiled device data.  Last Pain:  Vitals:   09/05/23 0734  TempSrc: Temporal  PainSc: 3       Patients Stated Pain Goal: 1 (09/04/23 2015)  Complications: No notable events documented.

## 2023-09-05 NOTE — Op Note (Signed)
[facility] Patient Name: Suzanne Harrison Procedure Date : 09/05/2023 MRN: [id number] Attending MD: Vida Rigger , MD, [id number] Date of Birth: Nov 03, 1951 CSN: [id number] Age: 72 Admit Type: Inpatient Procedure:                ERCP Indications:              Bile duct stone on MRCP, Jaundice Providers:                Vida Rigger, MD, Eliberto Ivory, RN, Marja Kays,                            Technician Referring MD:              Medicines:                General Anesthesia Complications:            No immediate complications. Estimated Blood Loss:     Estimated blood loss: none. Procedure:                Pre-Anesthesia Assessment:                           - Prior to the procedure, a History and Physical                            was performed, and patient medications and                            allergies were reviewed. The patient's tolerance of                            previous anesthesia was also reviewed. The risks                            and benefits of the procedure and the sedation                            options and risks were discussed with the patient.                            All questions were answered, and informed consent                            was obtained. Prior Anticoagulants: The patient has                            taken no anticoagulant or antiplatelet agents                            except for aspirin. ASA Grade Assessment: III - A                            patient with severe systemic disease. After  reviewing the risks and benefits, the patient was                            deemed in satisfactory condition to undergo the                            procedure.                           After obtaining informed consent, the scope was                            passed under direct vision. Throughout the                            procedure, the patient's blood pressure, pulse, and                             oxygen saturations were monitored continuously. The                            [id number]-Q190V ([id number]) Olympus duodenoscope was                            introduced through the mouth, and used to inject                            contrast into and used to cannulate the bile duct.                            The ERCP was somewhat difficult due to abnormal                            anatomy. Successful completion of the procedure was                            aided by performing the maneuvers documented                            (below) in this report. Passing the scope was                            fairly difficult and did require rolling her on her                            side with some abdominal pressure and the patient                            tolerated the procedure well. Scope In: Scope Out: Findings:      The major papilla was edematous. Deep selective cannulation was readily       obtained and there was no pancreatic duct injection or wire advancement       throughout the procedure and an obvious large stone was seen on  initial       cholangiogram and a biliary sphincterotomy was made with a Hydratome       sphincterotome using ERBE electrocautery. There was no       post-sphincterotomy bleeding. We did a rather large sphincterotomy to       get the large stone out and could get the fully bowed sphincterotome       easily in and out of the duct and there was adequate biliary drainage       and to discover objects, the biliary tree was swept with a 15 mm balloon       starting at the bifurcation. Sludge was swept from the duct. All stones       were removed. Nothing was found on subsequent occlusion cholangiogram       and multiple negative balloon pull-through's and the 15 mm balloon       passed readily through the patent sphincterotomy site and there was       adequate biliary drainage and the wire and the balloon and scope were       removed and the patient tolerated  the procedure well. Impression:               - The major papilla appeared edematous.                           - Choledocholithiasis was found. Complete removal                            was accomplished by biliary sphincterotomy and                            balloon extraction.                           - A biliary sphincterotomy was performed.                           - The biliary tree was swept and nothing was found                            at the end of the procedure. Recommendation:           - Clear liquid diet for 6 hours. If doing well this                            evening may have soft solids                           - Continue present medications. And rediscussed                            possible retrying cholecystectomy with surgical team                           - Return to GI clinic PRN. Or in 1 to 2 weeks with  her primary gastroenterologist Dr. Dulce Sellar                           - Telephone GI clinic if symptomatic PRN. My                            partner Dr. Marca Ancona will check on tomorrow                           - Check liver enzymes (AST, ALT, alkaline                            phosphatase, bilirubin) in the morning. And follow                            back to normal as an outpatient Procedure Code(s):        --- Professional ---                           [phone], Endoscopic retrograde                            cholangiopancreatography (ERCP); with removal of                            calculi/debris from biliary/pancreatic duct(s)                           43262, Endoscopic retrograde                            cholangiopancreatography (ERCP); with                            sphincterotomy/papillotomy Diagnosis Code(s):        --- Professional ---                           K80.50, Calculus of bile duct without cholangitis                            or cholecystitis without obstruction                           R17, Unspecified  jaundice                           K83.8, Other specified diseases of biliary tract CPT copyright 2022 [employer]. All rights reserved. The codes documented in this report are preliminary and upon coder review may  be revised to meet current compliance requirements. Vida Rigger, MD 09/05/2023 9:10:56 AM This report has been signed electronically. Number of Addenda: 0

## 2023-09-05 NOTE — Plan of Care (Signed)
  Problem: Education: Goal: Knowledge of General Education information will improve Description: Including pain rating scale, medication(s)/side effects and non-pharmacologic comfort measures 09/05/2023 0100 by Dahlia Bailiff, RN Outcome: Progressing 09/05/2023 0055 by Dahlia Bailiff, RN Outcome: Progressing   Problem: Health Behavior/Discharge Planning: Goal: Ability to manage health-related needs will improve 09/05/2023 0100 by Dahlia Bailiff, RN Outcome: Progressing 09/05/2023 0055 by Dahlia Bailiff, RN Outcome: Progressing   Problem: Clinical Measurements: Goal: Ability to maintain clinical measurements within normal limits will improve 09/05/2023 0100 by Dahlia Bailiff, RN Outcome: Progressing 09/05/2023 0055 by Dahlia Bailiff, RN Outcome: Progressing   Problem: Education: Goal: Knowledge of General Education information will improve Description: Including pain rating scale, medication(s)/side effects and non-pharmacologic comfort measures 09/05/2023 0100 by Dahlia Bailiff, RN Outcome: Progressing 09/05/2023 0055 by Dahlia Bailiff, RN Outcome: Progressing   Problem: Health Behavior/Discharge Planning: Goal: Ability to manage health-related needs will improve 09/05/2023 0100 by Dahlia Bailiff, RN Outcome: Progressing 09/05/2023 0055 by Dahlia Bailiff, RN Outcome: Progressing   Problem: Clinical Measurements: Goal: Ability to maintain clinical measurements within normal limits will improve 09/05/2023 0100 by Dahlia Bailiff, RN Outcome: Progressing 09/05/2023 0055 by Dahlia Bailiff, RN Outcome: Progressing   Problem: Education: Goal: Knowledge of General Education information will improve Description: Including pain rating scale, medication(s)/side effects and non-pharmacologic comfort measures 09/05/2023 0101 by Dahlia Bailiff, RN Outcome: Progressing 09/05/2023 0100 by Dahlia Bailiff, RN Outcome: Progressing 09/05/2023 0055 by  Dahlia Bailiff, RN Outcome: Progressing   Problem: Health Behavior/Discharge Planning: Goal: Ability to manage health-related needs will improve 09/05/2023 0101 by Dahlia Bailiff, RN Outcome: Progressing 09/05/2023 0100 by Dahlia Bailiff, RN Outcome: Progressing 09/05/2023 0055 by Dahlia Bailiff, RN Outcome: Progressing   Problem: Clinical Measurements: Goal: Ability to maintain clinical measurements within normal limits will improve 09/05/2023 0100 by Dahlia Bailiff, RN Outcome: Progressing 09/05/2023 0055 by Dahlia Bailiff, RN Outcome: Progressing

## 2023-09-05 NOTE — Plan of Care (Signed)

## 2023-09-06 DIAGNOSIS — R17 Unspecified jaundice: Secondary | ICD-10-CM | POA: Diagnosis not present

## 2023-09-06 LAB — CBC WITH DIFFERENTIAL/PLATELET
Abs Immature Granulocytes: 0.04 10*3/uL (ref 0.00–0.07)
Basophils Absolute: 0 10*3/uL (ref 0.0–0.1)
Basophils Relative: 0 %
Eosinophils Absolute: 0 10*3/uL (ref 0.0–0.5)
Eosinophils Relative: 0 %
HCT: 29.4 % — ABNORMAL LOW (ref 36.0–46.0)
Hemoglobin: 9.5 g/dL — ABNORMAL LOW (ref 12.0–15.0)
Immature Granulocytes: 1 %
Lymphocytes Relative: 18 %
Lymphs Abs: 1.2 10*3/uL (ref 0.7–4.0)
MCH: 27.9 pg (ref 26.0–34.0)
MCHC: 32.3 g/dL (ref 30.0–36.0)
MCV: 86.2 fL (ref 80.0–100.0)
Monocytes Absolute: 0.5 10*3/uL (ref 0.1–1.0)
Monocytes Relative: 8 %
Neutro Abs: 4.7 10*3/uL (ref 1.7–7.7)
Neutrophils Relative %: 73 %
Platelets: 172 10*3/uL (ref 150–400)
RBC: 3.41 MIL/uL — ABNORMAL LOW (ref 3.87–5.11)
RDW: 17.7 % — ABNORMAL HIGH (ref 11.5–15.5)
WBC: 6.5 10*3/uL (ref 4.0–10.5)
nRBC: 0 % (ref 0.0–0.2)

## 2023-09-06 LAB — COMPREHENSIVE METABOLIC PANEL
ALT: 46 U/L — ABNORMAL HIGH (ref 0–44)
AST: 43 U/L — ABNORMAL HIGH (ref 15–41)
Albumin: 2 g/dL — ABNORMAL LOW (ref 3.5–5.0)
Alkaline Phosphatase: 602 U/L — ABNORMAL HIGH (ref 38–126)
Anion gap: 9 (ref 5–15)
BUN: 9 mg/dL (ref 8–23)
CO2: 27 mmol/L (ref 22–32)
Calcium: 8.6 mg/dL — ABNORMAL LOW (ref 8.9–10.3)
Chloride: 100 mmol/L (ref 98–111)
Creatinine, Ser: 0.94 mg/dL (ref 0.44–1.00)
GFR, Estimated: >60 mL/min (ref >60)
Glucose, Bld: 119 mg/dL — ABNORMAL HIGH (ref 70–99)
Potassium: 4.3 mmol/L (ref 3.5–5.1)
Sodium: 136 mmol/L (ref 135–145)
Total Bilirubin: 2.8 mg/dL — ABNORMAL HIGH (ref 0.3–1.2)
Total Protein: 5.8 g/dL — ABNORMAL LOW (ref 6.5–8.1)

## 2023-09-06 LAB — T4, FREE: Free T4: 1.07 ng/dL (ref 0.61–1.12)

## 2023-09-06 LAB — PROTIME-INR
INR: 1.1 (ref 0.8–1.2)
Prothrombin Time: 14.2 s (ref 11.4–15.2)

## 2023-09-06 LAB — HIV ANTIBODY (ROUTINE TESTING W REFLEX): HIV Screen 4th Generation wRfx: NONREACTIVE

## 2023-09-06 LAB — C-REACTIVE PROTEIN: CRP: <0.5 mg/dL (ref <1.0)

## 2023-09-06 LAB — MAGNESIUM: Magnesium: 1.8 mg/dL (ref 1.7–2.4)

## 2023-09-06 LAB — TSH: TSH: 1.071 u[IU]/mL (ref 0.350–4.500)

## 2023-09-06 LAB — PROCALCITONIN: Procalcitonin: 0.95 ng/mL

## 2023-09-06 LAB — BRAIN NATRIURETIC PEPTIDE: B Natriuretic Peptide: 597.2 pg/mL — ABNORMAL HIGH (ref 0.0–100.0)

## 2023-09-06 MED ORDER — CITALOPRAM HYDROBROMIDE 20 MG PO TABS
40.0000 mg | ORAL_TABLET | Freq: Every day | ORAL | Status: DC
  Administered 2023-09-06 – 2023-09-07 (×2): 40 mg via ORAL
  Filled 2023-09-06 (×2): qty 2

## 2023-09-06 MED ORDER — ONDANSETRON HCL 4 MG PO TABS: 4.0000 mg | ORAL_TABLET | Freq: Four times a day (QID) | ORAL | Status: DC | PRN

## 2023-09-06 MED ORDER — ENOXAPARIN SODIUM 40 MG/0.4ML IJ SOSY: 40.0000 mg | PREFILLED_SYRINGE | INTRAMUSCULAR | Status: DC

## 2023-09-06 MED ORDER — POLYVINYL ALCOHOL 1.4 % OP SOLN: 1.0000 [drp] | Freq: Two times a day (BID) | OPHTHALMIC | Status: DC | PRN

## 2023-09-06 MED ORDER — OXYCODONE HCL 5 MG PO TABS
10.0000 mg | ORAL_TABLET | ORAL | Status: DC | PRN
  Administered 2023-09-06 – 2023-09-07 (×6): 10 mg via ORAL
  Filled 2023-09-06 (×6): qty 2

## 2023-09-06 MED ORDER — LEVOTHYROXINE SODIUM 50 MCG PO TABS: 50.0000 ug | ORAL_TABLET | Freq: Every day | ORAL | Status: DC

## 2023-09-06 MED ORDER — ATENOLOL 50 MG PO TABS
50.0000 mg | ORAL_TABLET | Freq: Every day | ORAL | Status: DC
  Administered 2023-09-06 – 2023-09-07 (×2): 50 mg via ORAL
  Filled 2023-09-06 (×2): qty 1

## 2023-09-06 MED ORDER — PREGABALIN 75 MG PO CAPS: 150.0000 mg | ORAL_CAPSULE | Freq: Two times a day (BID) | ORAL | Status: DC

## 2023-09-06 MED ORDER — CALCIUM CARBONATE ANTACID 500 MG PO CHEW
1.0000 | CHEWABLE_TABLET | Freq: Two times a day (BID) | ORAL | Status: DC | PRN
  Administered 2023-09-06: 200 mg via ORAL
  Filled 2023-09-06: qty 1

## 2023-09-06 MED ORDER — ONDANSETRON HCL 4 MG/2ML IJ SOLN: 4.0000 mg | Freq: Four times a day (QID) | INTRAMUSCULAR | Status: DC | PRN

## 2023-09-06 MED ORDER — LEVOTHYROXINE SODIUM 50 MCG PO TABS
50.0000 ug | ORAL_TABLET | Freq: Every day | ORAL | Status: DC
  Administered 2023-09-06: 50 ug via ORAL
  Filled 2023-09-06: qty 1

## 2023-09-06 MED ORDER — BUPROPION HCL ER (XL) 150 MG PO TB24
150.0000 mg | ORAL_TABLET | Freq: Every day | ORAL | Status: DC
  Administered 2023-09-06 – 2023-09-07 (×2): 150 mg via ORAL
  Filled 2023-09-06 (×2): qty 1

## 2023-09-06 MED ORDER — ALBUTEROL SULFATE (2.5 MG/3ML) 0.083% IN NEBU: 2.5000 mg | INHALATION_SOLUTION | Freq: Four times a day (QID) | RESPIRATORY_TRACT | Status: DC | PRN

## 2023-09-06 MED ORDER — FESOTERODINE FUMARATE ER 4 MG PO TB24
4.0000 mg | ORAL_TABLET | Freq: Every day | ORAL | Status: DC
  Administered 2023-09-06 – 2023-09-07 (×2): 4 mg via ORAL
  Filled 2023-09-06 (×2): qty 1

## 2023-09-06 MED ORDER — ASPIRIN 81 MG PO TBEC
81.0000 mg | DELAYED_RELEASE_TABLET | Freq: Every day | ORAL | Status: DC
  Administered 2023-09-06: 81 mg via ORAL
  Filled 2023-09-06: qty 1

## 2023-09-06 MED ORDER — ALBUTEROL SULFATE (2.5 MG/3ML) 0.083% IN NEBU
2.5000 mg | INHALATION_SOLUTION | Freq: Four times a day (QID) | RESPIRATORY_TRACT | Status: DC
  Filled 2023-09-06: qty 3

## 2023-09-06 MED ORDER — LORATADINE 10 MG PO TABS
10.0000 mg | ORAL_TABLET | Freq: Every day | ORAL | Status: DC
  Administered 2023-09-06 – 2023-09-07 (×2): 10 mg via ORAL
  Filled 2023-09-06 (×2): qty 1

## 2023-09-06 MED ORDER — MAGNESIUM SULFATE 2 GM/50ML IV SOLN
2.0000 g | Freq: Once | INTRAVENOUS | Status: AC
  Administered 2023-09-06: 2 g via INTRAVENOUS
  Filled 2023-09-06: qty 50

## 2023-09-06 MED ORDER — PANTOPRAZOLE SODIUM 40 MG PO TBEC
40.0000 mg | DELAYED_RELEASE_TABLET | Freq: Two times a day (BID) | ORAL | Status: DC
  Administered 2023-09-06 – 2023-09-07 (×3): 40 mg via ORAL
  Filled 2023-09-06 (×3): qty 1

## 2023-09-06 MED ORDER — OXYCODONE-ACETAMINOPHEN 5-325 MG PO TABS
2.0000 | ORAL_TABLET | Freq: Once | ORAL | Status: AC | PRN
  Administered 2023-09-06: 2 via ORAL
  Filled 2023-09-06: qty 2

## 2023-09-06 NOTE — Progress Notes (Signed)
[facility] night cross cover note:   I was notified by RN that the patient is complaining of chronic pain and requesting resumption of her outpatient prn Percocet 10/325 mg, noting that her chronic pain is refractory to existing prn tramadol.  I placed order for one-time prn dose for Percocet 10/325 mg po.      Newton Pigg, DO Hospitalist

## 2023-09-06 NOTE — Progress Notes (Signed)
Subjective: Patient states that the abdominal pain has improved after ERCP. She can planes of back pain, but states this is a chronic problem for her. She does not have any nausea or vomiting. She reports having a bowel movement post ERCP, states it appeared normal. She is tolerating regular diet for breakfast today morning.  Objective: Vital signs in last 24 hours: Temp:  [97.5 F (36.4 C)-98.3 F (36.8 C)] 97.5 F (36.4 C) (10/07 0740) Pulse Rate:  [49-67] 61 (10/07 0409) Resp:  [12-18] 15 (10/07 0740) BP: (113-168)/(51-107) 113/51 (10/07 0740) SpO2:  [92 %-100 %] 95 % (10/06 1219) Weight change:  Last BM Date : 09/03/23  PE: Appears comfortable GENERAL: No obvious icterus, mild pallor  ABDOMEN: Soft, nondistended, nontender EXTREMITIES: No deformity  Lab Results: Results for orders placed or performed during the hospital encounter of 09/03/23 (from the past 48 hour(s))  CBC with Differential/Platelet     Status: Abnormal   Collection Time: 09/05/23  6:20 AM  Result Value Ref Range   WBC 6.8 4.0 - 10.5 K/uL   RBC 3.44 (L) 3.87 - 5.11 MIL/uL   Hemoglobin 9.5 (L) 12.0 - 15.0 g/dL   HCT 52.8 (L) 41.3 - 24.4 %   MCV 85.2 80.0 - 100.0 fL   MCH 27.6 26.0 - 34.0 pg   MCHC 32.4 30.0 - 36.0 g/dL   RDW 01.0 (H) 27.2 - 53.6 %   Platelets 158 150 - 400 K/uL   nRBC 0.0 0.0 - 0.2 %   Neutrophils Relative % 70 %   Neutro Abs 4.7 1.7 - 7.7 K/uL   Lymphocytes Relative 20 %   Lymphs Abs 1.4 0.7 - 4.0 K/uL   Monocytes Relative 7 %   Monocytes Absolute 0.4 0.1 - 1.0 K/uL   Eosinophils Relative 3 %   Eosinophils Absolute 0.2 0.0 - 0.5 K/uL   Basophils Relative 0 %   Basophils Absolute 0.0 0.0 - 0.1 K/uL   Immature Granulocytes 0 %   Abs Immature Granulocytes 0.02 0.00 - 0.07 K/uL    Comment: Performed at [facility], [address] [place], [place] [address]  Brain natriuretic peptide     Status: Abnormal   Collection Time: 09/05/23  6:20 AM  Result Value Ref Range   B  Natriuretic Peptide 606.2 (H) 0.0 - 100.0 pg/mL    Comment: Performed at [facility], [address] [place], [place] [address]  C-reactive protein     Status: Abnormal   Collection Time: 09/05/23  6:20 AM  Result Value Ref Range   CRP 18.8 (H) <1.0 mg/dL    Comment: Performed at [facility], [address], [place], [place] [address]  Procalcitonin     Status: None   Collection Time: 09/05/23  6:20 AM  Result Value Ref Range   Procalcitonin 1.82 ng/mL    Comment:        Interpretation: PCT > 0.5 ng/mL and <= 2 ng/mL: Systemic infection (sepsis) is possible, but other conditions are known to elevate PCT as well. (NOTE)       Sepsis PCT Algorithm           Lower Respiratory Tract                                      Infection PCT Algorithm    ----------------------------     ----------------------------  PCT < 0.25 ng/mL                PCT < 0.10 ng/mL          Strongly encourage             Strongly discourage   discontinuation of antibiotics    initiation of antibiotics    ----------------------------     -----------------------------       PCT 0.25 - 0.50 ng/mL            PCT 0.10 - 0.25 ng/mL               OR       >80% decrease in PCT            Discourage initiation of                                            antibiotics      Encourage discontinuation           of antibiotics    ----------------------------     -----------------------------         PCT >= 0.50 ng/mL              PCT 0.26 - 0.50 ng/mL                AND       <80% decrease in PCT             Encourage initiation of                                             antibiotics       Encourage continuation           of antibiotics    ----------------------------     -----------------------------        PCT >= 0.50 ng/mL                  PCT > 0.50 ng/mL               AND         increase in PCT                  Strongly encourage                                      initiation of  antibiotics    Strongly encourage escalation           of antibiotics                                     -----------------------------                                           PCT <= 0.25 ng/mL  OR                                        > 80% decrease in PCT                                      Discontinue / Do not initiate                                             antibiotics  Performed at [facility], [address] [place], [place] [address]   Magnesium     Status: None   Collection Time: 09/05/23  6:20 AM  Result Value Ref Range   Magnesium 1.9 1.7 - 2.4 mg/dL    Comment: Performed at [facility], [address] [place], [place] [address]  Comprehensive metabolic panel     Status: Abnormal   Collection Time: 09/05/23  6:20 AM  Result Value Ref Range   Sodium 135 135 - 145 mmol/L   Potassium 3.2 (L) 3.5 - 5.1 mmol/L   Chloride 101 98 - 111 mmol/L   CO2 25 22 - 32 mmol/L   Glucose, Bld 82 70 - 99 mg/dL    Comment: Glucose reference range applies only to samples taken after fasting for at least 8 hours.   BUN 8 8 - 23 mg/dL   Creatinine, Ser 3.32 0.44 - 1.00 mg/dL   Calcium 8.5 (L) 8.9 - 10.3 mg/dL   Total Protein 5.8 (L) 6.5 - 8.1 g/dL   Albumin 2.1 (L) 3.5 - 5.0 g/dL   AST 70 (H) 15 - 41 U/L   ALT 58 (H) 0 - 44 U/L   Alkaline Phosphatase 652 (H) 38 - 126 U/L   Total Bilirubin 5.2 (H) 0.3 - 1.2 mg/dL   GFR, Estimated >95 >18 mL/min    Comment: (NOTE) Calculated using the CKD-EPI Creatinine Equation (2021)    Anion gap 9 5 - 15    Comment: Performed at [facility], [address] [place], [place] [address]  TSH     Status: Abnormal   Collection Time: 09/05/23  6:20 AM  Result Value Ref Range   TSH 5.681 (H) 0.350 - 4.500 uIU/mL    Comment: Performed by a 3rd Generation assay with a functional sensitivity of <=0.01 uIU/mL. Performed at [facility], [address], [place], [place]  [address]   CBC with Differential/Platelet     Status: Abnormal   Collection Time: 09/06/23  3:49 AM  Result Value Ref Range   WBC 6.5 4.0 - 10.5 K/uL   RBC 3.41 (L) 3.87 - 5.11 MIL/uL   Hemoglobin 9.5 (L) 12.0 - 15.0 g/dL   HCT 60.1 (L) 09.3 - 23.5 %   MCV 86.2 80.0 - 100.0 fL   MCH 27.9 26.0 - 34.0 pg   MCHC 32.3 30.0 - 36.0 g/dL   RDW 57.3 (H) 22.0 - 25.4 %   Platelets 172 150 - 400 K/uL   nRBC 0.0 0.0 - 0.2 %   Neutrophils Relative % 73 %   Neutro Abs 4.7 1.7 - 7.7 K/uL   Lymphocytes Relative 18 %   Lymphs Abs  1.2 0.7 - 4.0 K/uL   Monocytes Relative 8 %   Monocytes Absolute 0.5 0.1 - 1.0 K/uL   Eosinophils Relative 0 %   Eosinophils Absolute 0.0 0.0 - 0.5 K/uL   Basophils Relative 0 %   Basophils Absolute 0.0 0.0 - 0.1 K/uL   Immature Granulocytes 1 %   Abs Immature Granulocytes 0.04 0.00 - 0.07 K/uL    Comment: Performed at [facility], [address] [place], [place] [address]  Brain natriuretic peptide     Status: Abnormal   Collection Time: 09/06/23  3:49 AM  Result Value Ref Range   B Natriuretic Peptide 597.2 (H) 0.0 - 100.0 pg/mL    Comment: Performed at [facility], [address], [place], [place] [address]  C-reactive protein     Status: None   Collection Time: 09/06/23  3:49 AM  Result Value Ref Range   CRP <0.5 <1.0 mg/dL    Comment: Performed at [facility], [address], [place], [place] [address]  Procalcitonin     Status: None   Collection Time: 09/06/23  3:49 AM  Result Value Ref Range   Procalcitonin 0.95 ng/mL    Comment:        Interpretation: PCT > 0.5 ng/mL and <= 2 ng/mL: Systemic infection (sepsis) is possible, but other conditions are known to elevate PCT as well. (NOTE)       Sepsis PCT Algorithm           Lower Respiratory Tract                                      Infection PCT Algorithm    ----------------------------     ----------------------------         PCT < 0.25 ng/mL                PCT < 0.10 ng/mL           Strongly encourage             Strongly discourage   discontinuation of antibiotics    initiation of antibiotics    ----------------------------     -----------------------------       PCT 0.25 - 0.50 ng/mL            PCT 0.10 - 0.25 ng/mL               OR       >80% decrease in PCT            Discourage initiation of                                            antibiotics      Encourage discontinuation           of antibiotics    ----------------------------     -----------------------------         PCT >= 0.50 ng/mL              PCT 0.26 - 0.50 ng/mL                AND       <80% decrease in PCT             Encourage initiation of  antibiotics       Encourage continuation           of antibiotics    ----------------------------     -----------------------------        PCT >= 0.50 ng/mL                  PCT > 0.50 ng/mL               AND         increase in PCT                  Strongly encourage                                      initiation of antibiotics    Strongly encourage escalation           of antibiotics                                     -----------------------------                                           PCT <= 0.25 ng/mL                                                 OR                                        > 80% decrease in PCT                                      Discontinue / Do not initiate                                             antibiotics  Performed at [facility], [address], [place], [place] [address]   Magnesium     Status: None   Collection Time: 09/06/23  3:49 AM  Result Value Ref Range   Magnesium 1.8 1.7 - 2.4 mg/dL    Comment: Performed at [facility], [address] [place], [place] [address]  Comprehensive metabolic panel     Status: Abnormal   Collection Time: 09/06/23  3:49 AM  Result Value Ref Range   Sodium 136 135 - 145 mmol/L   Potassium 4.3 3.5 - 5.1 mmol/L    Chloride 100 98 - 111 mmol/L   CO2 27 22 - 32 mmol/L   Glucose, Bld 119 (H) 70 - 99 mg/dL    Comment: Glucose reference range applies only to samples taken after fasting for at least 8 hours.   BUN 9 8 - 23 mg/dL   Creatinine, Ser 0.98 0.44 - 1.00 mg/dL   Calcium 8.6 (L) 8.9 - 10.3 mg/dL  Total Protein 5.8 (L) 6.5 - 8.1 g/dL   Albumin 2.0 (L) 3.5 - 5.0 g/dL   AST 43 (H) 15 - 41 U/L   ALT 46 (H) 0 - 44 U/L   Alkaline Phosphatase 602 (H) 38 - 126 U/L   Total Bilirubin 2.8 (H) 0.3 - 1.2 mg/dL   GFR, Estimated >29 >51 mL/min    Comment: (NOTE) Calculated using the CKD-EPI Creatinine Equation (2021)    Anion gap 9 5 - 15    Comment: Performed at [facility], [address], [place], [place] [address]  Protime-INR     Status: None   Collection Time: 09/06/23  7:28 AM  Result Value Ref Range   Prothrombin Time 14.2 11.4 - 15.2 seconds   INR 1.1 0.8 - 1.2    Comment: (NOTE) INR goal varies based on device and disease states. Performed at [facility], [address] [place], [place] [address]     Studies/Results: DG ERCP  Result Date: 09/05/2023 CLINICAL DATA:  ERCP.  Choledocholithiasis. EXAM: ERCP TECHNIQUE: Multiple spot images obtained with the fluoroscopic device and submitted for interpretation post-procedure. COMPARISON:  MRCP-09/03/2023 FINDINGS: Five spot intraoperative fluoroscopic images of the right upper abdominal quadrant during ERCP are provided for review. Initial image demonstrates an ERCP probe overlying the right upper abdominal quadrant. There is a nonocclusive filling defect within the CBD suggestive of choledocholithiasis. Post long segment thoracolumbar paraspinal fusion, incompletely evaluated though both right-sided paraspinal rods appear fractured. Subsequent images demonstrate insufflation of a balloon within distal aspect of the CBD with subsequent biliary sweeping and presumed stone extraction and sphincterotomy. There is minimal opacification of  the intrahepatic biliary tree which appears nondilated. There is no definitive opacification of the cystic or pancreatic ducts. IMPRESSION: ERCP with findings of choledocholithiasis with subsequent presumed biliary sweeping and sphincterotomy. These images were submitted for radiologic interpretation only. Please see the procedural report for the amount of contrast and the fluoroscopy time utilized. Electronically Signed   By: Simonne Come M.D.   On: 09/05/2023 18:09    Medications: I have reviewed the patient's current medications.  Assessment: Status post ERCP, stone removal, sphincterotomy LFTs improved, T. 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 Ideally she will benefit from cholecystectomy as she remains at high risk of migration of gallstones into CBD. Cholecystectomy was aborted in 2020. Timing and technique of cholecystectomy as per surgical team. GI will sign off, please recall if needed.  Kerin Salen, MD 09/06/2023, 8:56 AM

## 2023-09-06 NOTE — Progress Notes (Signed)
PROGRESS NOTE                                                                                                                                                                                                             Patient Demographics:    Suzanne Harrison, is a 72 y.o. female, DOB - 06/13/1951, GEX:[id number]  Outpatient Primary MD for the patient is Maurice Small, MD (Inactive)    LOS - 3  Admit date - 09/03/2023    Chief Complaint  Patient presents with   Hypotension       Brief Narrative (HPI from H&P)   72 y.o. female with medical history significant of CKD, gallstones, HTN, dyslipidemia, ischemic colitis, cholecystitis s/p perc cholecystostomy tube (10/04/2019) and other medical issues who presented with abdominal pain.  Further workup suggested that she had a CBD stone with gallbladder distention.   Subjective:   Patient in bed, appears comfortable, denies any headache, no fever, no chest pain or pressure, no shortness of breath , no abdominal pain. No focal weakness.   Assessment  & Plan :   Right upper quadrant abdominal Pain  Jaundice  Elevated Liver Enzymes, History Cholecystitis s/p Perc Chole Tube (10/2019) - now has CBD stone with gallbladder distention.  Possible mild ascending cholangitis, sepsis with gram-negative bacteremia. She did with antibiotics IV fluids underwent ERCP with sphincterectomy, CBD stones removed, post ERCP clinically better LFTs improving, follow final culture and sensitivity, prelim cultures E. coli and responding went to Rocephin, per general surgery repeat hide on 09/07/2023 if stable then discharge home.   Acute Kidney Injury Follow with IVF, improving   Hypokalemia and hypomagnesemia.  Replaced again on 09/05/2023.    Prolonged Qtc Caution with QT prolonging meds, replace electrolytes.   Hypertension Hold lisinopril and lasix for now Atenolol continued   Depression   Wellbutrin, celexa   Hypothyroidism  Synthroid  GERD PPI   Chronic Pain Lyrica  Prn oxycodone   Overactive Bladder Detrol LA  Dyslipidemia Statin on hold with abnormal LFT's   Obesity Body mass index is 33.1 kg/m.  Follow-up with PCP for weight loss.         Condition - Guarded  Family Communication  :  husband Loraine Leriche [phone] on 09/04/2023  Code Status :  Full  Consults  :  GI, CCS  PUD Prophylaxis :  PPI   Procedures  :     HIDA -   ERCP - Impression:               - The major papilla appeared edematous.                           - Choledocholithiasis was found. Complete removal                            was accomplished by biliary sphincterotomy and                            balloon extraction.                           - A biliary sphincterotomy was performed.                           - The biliary tree was swept and nothing was found                            at the end of the procedure. Recommendation:           - Clear liquid diet for 6 hours. If doing well this                            evening may have soft solids                           - Continue present medications. And rediscussed                            possible retrying cholecystectomy with surgical team                           - Return to GI clinic PRN. Or in 1 to 2 weeks with                            her primary gastroenterologist Dr. Dulce Sellar                           - Telephone GI clinic if symptomatic PRN. My                            partner Dr. Marca Ancona will check on tomorrow                           - Check liver enzymes (AST, ALT, alkaline                            phosphatase, bilirubin) in the morning. And follow                            back to normal as an outpatient  MRCP - 1. Choledocholithiasis, with large gallstone near the  ampulla measuring 1.0 cm. Intra and extrahepatic biliary ductal dilatation, the central common bile duct measuring up to 1.2 cm. Notably,  there was a calculus in the central common bile duct on examination dated 10/06/2022, calculus on today's examination appears to be substantially larger. 2. Mildly distended gallbladder with additional tiny gallstones. 3. Mild left hydronephrosis with abrupt caliber change at the ureteropelvic junction, suggesting chronic ureteropelvic junction stricture, appearance generally unchanged compared to prior examination. Nuclear scintigraphic Lasix renogram can be considered on a nonemergent, outpatient basis to assess for functional significance if and when clinically appropriate.       Disposition Plan  :    Status is: Inpatient   DVT Prophylaxis  :  Heparin    Lab Results  Component Value Date   PLT 172 09/06/2023    Diet :  Diet Order             Diet NPO time specified  Diet effective midnight           Diet regular Room service appropriate? Yes; Fluid consistency: Thin  Diet effective now                    Inpatient Medications  Scheduled Meds:  amLODipine  10 mg Oral Daily   aspirin EC  81 mg Oral QHS   atenolol  50 mg Oral Daily   buPROPion  150 mg Oral Daily   citalopram  40 mg Oral Daily   fesoterodine  4 mg Oral Daily   heparin injection (subcutaneous)  5,000 Units Subcutaneous Q8H   levothyroxine  50 mcg Oral Q0600   loratadine  10 mg Oral Daily   pantoprazole  40 mg Oral BID   pregabalin  150 mg Oral BID   rosuvastatin  10 mg Oral QHS   Continuous Infusions:  cefTRIAXone (ROCEPHIN)  IV 2 g (09/06/23 0503)   PRN Meds:.albuterol, hydrALAZINE, loperamide, naLOXone (NARCAN)  injection, ondansetron **OR** ondansetron (ZOFRAN) IV, oxyCODONE, polyvinyl alcohol, traMADol    Objective:   Vitals:   09/06/23 0000 09/06/23 0145 09/06/23 0409 09/06/23 0740  BP: 126/62 126/62 (!) 152/63 (!) 113/51  Pulse: 62  61   Resp: 13 14 17 15   Temp: 97.8 F (36.6 C)  98.3 F (36.8 C) (!) 97.5 F (36.4 C)  TempSrc: Oral  Oral   SpO2:      Weight:      Height:         Wt Readings from Last 3 Encounters:  09/05/23 82.1 kg  07/24/23 82.1 kg  10/10/22 82.1 kg     Intake/Output Summary (Last 24 hours) at 09/06/2023 1009 Last data filed at 09/05/2023 1700 Gross per 24 hour  Intake 560.58 ml  Output --  Net 560.58 ml     Physical Exam  Awake Alert, No new F.N deficits, Normal affect [place].AT,PERRAL Supple Neck, No JVD,   Symmetrical Chest wall movement, Good air movement bilaterally, CTAB RRR,No Gallops,Rubs or new Murmurs,  +ve B.Sounds, Abd Soft, no ruq tenderness,   No Cyanosis, Clubbing or edema     Data Review:    Recent Labs  Lab 09/03/23 1320 09/04/23 0621 09/05/23 0620 09/06/23 0349  WBC 23.8* 12.0* 6.8 6.5  HGB 10.6* 8.8* 9.5* 9.5*  HCT 32.9* 27.2* 29.3* 29.4*  PLT 211 146* 158 172  MCV 88.0 85.5 85.2 86.2  MCH 28.3 27.7 27.6 27.9  MCHC 32.2 32.4 32.4 32.3  RDW 17.3* 17.2* 17.3* 17.7*  LYMPHSABS 0.8 1.2 1.4 1.2  MONOABS 1.1* 0.5 0.4 0.5  EOSABS 0.0 0.1 0.2 0.0  BASOSABS 0.0 0.0 0.0 0.0    Recent Labs  Lab 09/03/23 1320 09/03/23 1555 09/04/23 0621 09/05/23 0620 09/06/23 0349 09/06/23 0728  NA 132*  --  135 135 136  --   K 3.8  --  3.1* 3.2* 4.3  --   CL 98  --  102 101 100  --   CO2 23  --  27 25 27   --   ANIONGAP 11  --  6 9 9   --   GLUCOSE 145*  --  78 82 119*  --   BUN 18  --  16 8 9   --   CREATININE 1.54*  --  1.04* 0.91 0.94  --   AST 164*  --  110* 70* 43*  --   ALT 92*  --  68* 58* 46*  --   ALKPHOS 858*  --  639* 652* 602*  --   BILITOT 9.9*  --  8.1* 5.2* 2.8*  --   ALBUMIN 2.4*  --  1.9* 2.1* 2.0*  --   CRP  --   --  18.7* 18.8* <0.5  --   PROCALCITON  --   --  4.34 1.82 0.95  --   LATICACIDVEN  --  0.6  --   --   --   --   INR  --   --  1.2  --   --  1.1  TSH  --   --   --  5.681*  --  1.071  BNP  --   --  236.0* 606.2* 597.2*  --   MG  --   --  1.5* 1.9 1.8  --   CALCIUM 8.5*  --  8.4* 8.5* 8.6*  --       Recent Labs  Lab 09/03/23 1320 09/03/23 1555 09/04/23 0621 09/05/23 0620  09/06/23 0349 09/06/23 0728  CRP  --   --  18.7* 18.8* <0.5  --   PROCALCITON  --   --  4.34 1.82 0.95  --   LATICACIDVEN  --  0.6  --   --   --   --   INR  --   --  1.2  --   --  1.1  TSH  --   --   --  5.681*  --  1.071  BNP  --   --  236.0* 606.2* 597.2*  --   MG  --   --  1.5* 1.9 1.8  --   CALCIUM 8.5*  --  8.4* 8.5* 8.6*  --     --------------------------------------------------------------------------------------------------------------- Lab Results  Component Value Date   CHOL 201 (H) 09/03/2023   HDL 23 (L) 09/03/2023   LDLCALC 164 (H) 09/03/2023   TRIG 71 09/03/2023   CHOLHDL 8.7 09/03/2023    No results found for: "HGBA1C" Recent Labs    09/06/23 0728  TSH 1.071  FREET4 1.07   No results for input(s): "VITAMINB12", "FOLATE", "FERRITIN", "TIBC", "IRON", "RETICCTPCT" in the last 72 hours. ------------------------------------------------------------------------------------------------------------------ Cardiac Enzymes No results for input(s): "CKMB", "TROPONINI", "MYOGLOBIN" in the last 168 hours.  Invalid input(s): "CK"  Micro Results Recent Results (from the past 240 hour(s))  Blood culture (routine x 2)     Status: Abnormal (Preliminary result)   Collection Time: 09/03/23  3:54 PM   Specimen: BLOOD  Result Value Ref Range Status   Specimen Description BLOOD BLOOD RIGHT ARM  Final   Special  Requests   Final    BOTTLES DRAWN AEROBIC AND ANAEROBIC Blood Culture adequate volume   Culture  Setup Time   Final    GRAM NEGATIVE RODS AEROBIC BOTTLE ONLY CRITICAL RESULT CALLED TO, READ BACK BY AND VERIFIED WITH: PHARMD NATHAN GOAD [id number] AT 1018 BY EC    Culture (A)  Final    ESCHERICHIA COLI CONFIRMATION OF SUSCEPTIBILITIES IN PROGRESS Performed at [facility], [address] [place], [place] [address]    Report Status PENDING  Incomplete  Blood Culture ID Panel (Reflexed)     Status: Abnormal   Collection Time: 09/03/23  3:54 PM  Result Value  Ref Range Status   Enterococcus faecalis NOT DETECTED NOT DETECTED Final   Enterococcus Faecium NOT DETECTED NOT DETECTED Final   Listeria monocytogenes NOT DETECTED NOT DETECTED Final   Staphylococcus species NOT DETECTED NOT DETECTED Final   Staphylococcus aureus (BCID) NOT DETECTED NOT DETECTED Final   Staphylococcus epidermidis NOT DETECTED NOT DETECTED Final   Staphylococcus lugdunensis NOT DETECTED NOT DETECTED Final   Streptococcus species NOT DETECTED NOT DETECTED Final   Streptococcus agalactiae NOT DETECTED NOT DETECTED Final   Streptococcus pneumoniae NOT DETECTED NOT DETECTED Final   Streptococcus pyogenes NOT DETECTED NOT DETECTED Final   A.calcoaceticus-baumannii NOT DETECTED NOT DETECTED Final   Bacteroides fragilis NOT DETECTED NOT DETECTED Final   Enterobacterales DETECTED (A) NOT DETECTED Final    Comment: Enterobacterales represent a large order of gram negative bacteria, not a single organism. CRITICAL RESULT CALLED TO, READ BACK BY AND VERIFIED WITH: PHARMD NATHAN GOAD [id number] AT 1018 BY EC    Enterobacter cloacae complex NOT DETECTED NOT DETECTED Final   Escherichia coli DETECTED (A) NOT DETECTED Final    Comment: CRITICAL RESULT CALLED TO, READ BACK BY AND VERIFIED WITH: PHARMD NATHAN GOAD [id number] AT 1018 BY EC    Klebsiella aerogenes NOT DETECTED NOT DETECTED Final   Klebsiella oxytoca NOT DETECTED NOT DETECTED Final   Klebsiella pneumoniae NOT DETECTED NOT DETECTED Final   Proteus species NOT DETECTED NOT DETECTED Final   Salmonella species NOT DETECTED NOT DETECTED Final   Serratia marcescens NOT DETECTED NOT DETECTED Final   Haemophilus influenzae NOT DETECTED NOT DETECTED Final   Neisseria meningitidis NOT DETECTED NOT DETECTED Final   Pseudomonas aeruginosa NOT DETECTED NOT DETECTED Final   Stenotrophomonas maltophilia NOT DETECTED NOT DETECTED Final   Candida albicans NOT DETECTED NOT DETECTED Final   Candida auris NOT DETECTED NOT DETECTED Final    Candida glabrata NOT DETECTED NOT DETECTED Final   Candida krusei NOT DETECTED NOT DETECTED Final   Candida parapsilosis NOT DETECTED NOT DETECTED Final   Candida tropicalis NOT DETECTED NOT DETECTED Final   Cryptococcus neoformans/gattii NOT DETECTED NOT DETECTED Final   CTX-M ESBL NOT DETECTED NOT DETECTED Final   Carbapenem resistance IMP NOT DETECTED NOT DETECTED Final   Carbapenem resistance KPC NOT DETECTED NOT DETECTED Final   Carbapenem resistance NDM NOT DETECTED NOT DETECTED Final   Carbapenem resist OXA 48 LIKE NOT DETECTED NOT DETECTED Final   Carbapenem resistance VIM NOT DETECTED NOT DETECTED Final    Comment: Performed at [facility], [address], [place], [place] [address]  Blood culture (routine x 2)     Status: Abnormal (Preliminary result)   Collection Time: 09/03/23  4:00 PM   Specimen: BLOOD  Result Value Ref Range Status   Specimen Description BLOOD BLOOD LEFT ARM  Final   Special Requests  Final    BOTTLES DRAWN AEROBIC AND ANAEROBIC Blood Culture adequate volume   Culture  Setup Time   Final    GRAM NEGATIVE RODS AEROBIC BOTTLE ONLY CRITICAL VALUE NOTED.  VALUE IS CONSISTENT WITH PREVIOUSLY REPORTED AND CALLED VALUE. Performed at [facility], [address] [place], [place] [address]    Culture ESCHERICHIA COLI (A)  Final   Report Status PENDING  Incomplete    Radiology Reports DG ERCP  Result Date: 09/05/2023 CLINICAL DATA:  ERCP.  Choledocholithiasis. EXAM: ERCP TECHNIQUE: Multiple spot images obtained with the fluoroscopic device and submitted for interpretation post-procedure. COMPARISON:  MRCP-09/03/2023 FINDINGS: Five spot intraoperative fluoroscopic images of the right upper abdominal quadrant during ERCP are provided for review. Initial image demonstrates an ERCP probe overlying the right upper abdominal quadrant. There is a nonocclusive filling defect within the CBD suggestive of choledocholithiasis. Post long segment thoracolumbar  paraspinal fusion, incompletely evaluated though both right-sided paraspinal rods appear fractured. Subsequent images demonstrate insufflation of a balloon within distal aspect of the CBD with subsequent biliary sweeping and presumed stone extraction and sphincterotomy. There is minimal opacification of the intrahepatic biliary tree which appears nondilated. There is no definitive opacification of the cystic or pancreatic ducts. IMPRESSION: ERCP with findings of choledocholithiasis with subsequent presumed biliary sweeping and sphincterotomy. These images were submitted for radiologic interpretation only. Please see the procedural report for the amount of contrast and the fluoroscopy time utilized. Electronically Signed   By: Simonne Come M.D.   On: 09/05/2023 18:09   MR ABDOMEN MRCP W WO CONTAST  Result Date: 09/03/2023 CLINICAL DATA:  Jaundice EXAM: MRI ABDOMEN WITHOUT AND WITH CONTRAST (INCLUDING MRCP) TECHNIQUE: Multiplanar multisequence MR imaging of the abdomen was performed both before and after the administration of intravenous contrast. Heavily T2-weighted images of the biliary and pancreatic ducts were obtained, and three-dimensional MRCP images were rendered by post processing. CONTRAST:  10mL GADAVIST GADOBUTROL 1 MMOL/ML IV SOLN COMPARISON:  Right upper quadrant ultrasound, 09/03/2023 FINDINGS: Lower chest: No acute abnormality.  Cardiomegaly. Hepatobiliary: No solid liver abnormality is seen. Choledocholithiasis, with large gallstone near the ampulla measuring 1.0 cm. Intra and extrahepatic biliary ductal dilatation, the central common bile duct measuring up to 1.2 cm. Mildly distended gallbladder with additional tiny gallstones. Pancreas: Unremarkable. No pancreatic ductal dilatation or surrounding inflammatory changes. Spleen: Mild splenomegaly, maximum span 13.5 cm. Adrenals/Urinary Tract: Adrenal glands are unremarkable. Numerous bilateral simple and thinly septated bilateral renal cortical  cysts, benign, for which no further follow-up or characterization is required. Mild left hydronephrosis with abrupt caliber change at the ureteropelvic junction, suggesting chronic ureteropelvic junction stricture, appearance generally unchanged compared to prior examination. Stomach/Bowel: Stomach is within normal limits. No evidence of bowel wall thickening, distention, or inflammatory changes. Vascular/Lymphatic: No significant vascular findings are present. No enlarged abdominal lymph nodes. Other: No abdominal wall hernia or abnormality. No ascites. Musculoskeletal: No acute or significant osseous findings. IMPRESSION: 1. Choledocholithiasis, with large gallstone near the ampulla measuring 1.0 cm. Intra and extrahepatic biliary ductal dilatation, the central common bile duct measuring up to 1.2 cm. Notably, there was a calculus in the central common bile duct on examination dated 10/06/2022, calculus on today's examination appears to be substantially larger. 2. Mildly distended gallbladder with additional tiny gallstones. 3. Mild left hydronephrosis with abrupt caliber change at the ureteropelvic junction, suggesting chronic ureteropelvic junction stricture, appearance generally unchanged compared to prior examination. Nuclear scintigraphic Lasix renogram can be considered on a nonemergent, outpatient basis to assess  for functional significance if and when clinically appropriate. Electronically Signed   By: Jearld Lesch M.D.   On: 09/03/2023 21:28   US Abdomen Limited RUQ (LIVER/GB)  Result Date: 09/03/2023 CLINICAL DATA:  Right upper quadrant pain.  Mass lesion. EXAM: ULTRASOUND ABDOMEN LIMITED RIGHT UPPER QUADRANT COMPARISON:  Ultrasound 07/24/2023.  CT scan 10/06/2022 FINDINGS: Gallbladder: Distended gallbladder. Slight wall thickening measured up to 6 mm. No adjacent fluid. No shadowing stones. Common bile duct: Diameter: 10 mm, upper limits of normal for patient's age. Liver: No focal lesion  identified. Within normal limits in parenchymal echogenicity. Portal vein is patent on color Doppler imaging with normal direction of blood flow towards the liver. Other: Upper pole right-sided simple appearing renal cyst measuring 5.4 cm. IMPRESSION: Distended gallbladder with slight wall thickening but no stones or stranding. Please correlate with symptoms. Further workup as clinically appropriate. No biliary ductal dilatation. Common duct at the upper limits of normal for patient's age. Electronically Signed   By: Karen Kays M.D.   On: 09/03/2023 16:03      Signature  -   Susa Raring M.D on 09/06/2023 at 10:09 AM   -  To page go to [email or web address]

## 2023-09-06 NOTE — Progress Notes (Signed)
Mobility Specialist Progress Note:   09/06/23 0930  Mobility  Activity Ambulated with assistance in hallway  Level of Assistance Standby assist, set-up cues, supervision of patient - no hands on  Assistive Device Cane  Distance Ambulated (ft) 250 ft  Activity Response Tolerated well  Mobility Referral Yes  $Mobility charge 1 Mobility  Mobility Specialist Start Time (ACUTE ONLY) 0930  Mobility Specialist Stop Time (ACUTE ONLY) 0950  Mobility Specialist Time Calculation (min) (ACUTE ONLY) 20 min   Pt agreeable to mobility session. Required no physical assistance throughout ambulation, only supervision for safety. No c/o throughout, pt back in bed with all needs met.   Addison Lank Mobility Specialist Please contact via SecureChat or  Rehab office at [phone]

## 2023-09-06 NOTE — Progress Notes (Signed)
[facility] night cross cover note:  Per patient request, I have placed order for prn Tums.   Newton Pigg, DO Hospitalist

## 2023-09-06 NOTE — Progress Notes (Signed)
1 Day Post-Op   Subjective/Chief Complaint: DENIES PAIN     Objective: Vital signs in last 24 hours: Temp:  [97.5 F (36.4 C)-98.3 F (36.8 C)] 97.5 F (36.4 C) (10/07 0740) Pulse Rate:  [49-62] 61 (10/07 0409) Resp:  [13-18] 15 (10/07 0740) BP: (113-168)/(51-74) 113/51 (10/07 0740) SpO2:  [95 %] 95 % (10/06 1219) Last BM Date : 09/03/23  Intake/Output from previous day: 10/06 0701 - 10/07 0700 In: 1460.6 [I.V.:960.6; IV Piggyback:500] Out: -  Intake/Output this shift: No intake/output data recorded.  Abdomen: soft NT ND   Lab Results:  Recent Labs    09/05/23 0620 09/06/23 0349  WBC 6.8 6.5  HGB 9.5* 9.5*  HCT 29.3* 29.4*  PLT 158 172   BMET Recent Labs    09/05/23 0620 09/06/23 0349  NA 135 136  K 3.2* 4.3  CL 101 100  CO2 25 27  GLUCOSE 82 119*  BUN 8 9  CREATININE 0.91 0.94  CALCIUM 8.5* 8.6*   PT/INR Recent Labs    09/04/23 0621 09/06/23 0728  LABPROT 15.2 14.2  INR 1.2 1.1   ABG No results for input(s): "PHART", "HCO3" in the last 72 hours.  Invalid input(s): "PCO2", "PO2"  Studies/Results: DG ERCP  Result Date: 09/05/2023 CLINICAL DATA:  ERCP.  Choledocholithiasis. EXAM: ERCP TECHNIQUE: Multiple spot images obtained with the fluoroscopic device and submitted for interpretation post-procedure. COMPARISON:  MRCP-09/03/2023 FINDINGS: Five spot intraoperative fluoroscopic images of the right upper abdominal quadrant during ERCP are provided for review. Initial image demonstrates an ERCP probe overlying the right upper abdominal quadrant. There is a nonocclusive filling defect within the CBD suggestive of choledocholithiasis. Post long segment thoracolumbar paraspinal fusion, incompletely evaluated though both right-sided paraspinal rods appear fractured. Subsequent images demonstrate insufflation of a balloon within distal aspect of the CBD with subsequent biliary sweeping and presumed stone extraction and sphincterotomy. There is minimal  opacification of the intrahepatic biliary tree which appears nondilated. There is no definitive opacification of the cystic or pancreatic ducts. IMPRESSION: ERCP with findings of choledocholithiasis with subsequent presumed biliary sweeping and sphincterotomy. These images were submitted for radiologic interpretation only. Please see the procedural report for the amount of contrast and the fluoroscopy time utilized. Electronically Signed   By: Simonne Come M.D.   On: 09/05/2023 18:09    Anti-infectives: Anti-infectives (From admission, onward)    Start     Dose/Rate Route Frequency Ordered Stop   09/05/23 0600  cefTRIAXone (ROCEPHIN) 2 g in sodium chloride 0.9 % 100 mL IVPB        2 g 200 mL/hr over 30 Minutes Intravenous Every 24 hours 09/04/23 1831     09/04/23 0400  piperacillin-tazobactam (ZOSYN) IVPB 3.375 g  Status:  Discontinued        3.375 g 12.5 mL/hr over 240 Minutes Intravenous Every 8 hours 09/03/23 1822 09/04/23 1831   09/04/23 0000  piperacillin-tazobactam (ZOSYN) IVPB 3.375 g  Status:  Discontinued        3.375 g 12.5 mL/hr over 240 Minutes Intravenous Every 8 hours 09/03/23 1809 09/03/23 1822   09/03/23 1600  ceFEPIme (MAXIPIME) 2 g in sodium chloride 0.9 % 100 mL IVPB       Placed in "And" Linked Group   2 g 200 mL/hr over 30 Minutes Intravenous  Once 09/03/23 1530 09/03/23 1646   09/03/23 1600  metroNIDAZOLE (FLAGYL) IVPB 500 mg       Placed in "And" Linked Group   500 mg 100 mL/hr over  60 Minutes Intravenous  Once 09/03/23 1530 09/03/23 1745       Assessment/Plan: s/p Procedure(s): ENDOSCOPIC RETROGRADE CHOLANGIOPANCREATOGRAPHY (ERCP) (N/A) SPHINCTEROTOMY REMOVAL OF STONES Patient Active Problem List   Diagnosis Date Noted   Elevated bilirubin 09/03/2023   Jaundice 09/03/2023   Colitis 10/07/2022   Severe sepsis (HCC) 10/07/2022   AKI (acute kidney injury) (HCC) 10/07/2022   Acute hyponatremia 10/07/2022   Acute lower GI bleeding 10/07/2022   History of  essential hypertension 10/07/2022   Anemia of chronic disease 10/07/2022   Essential hypertension 12/22/2019   HLD (hyperlipidemia) 12/22/2019   Acquired hypothyroidism 12/22/2019   Anxiety 12/22/2019   Chronic anemia 12/22/2019   Parapneumonic effusion 12/22/2019   Lobar pneumonia (HCC) 12/22/2019   Loculated pleural effusion 12/16/2019   Acute cholecystitis 10/01/2019   Referred ear pain 05/27/2016   Hip arthritis 03/02/2013   History of total hip arthroplasty 03/02/2012      LFTs improving and no pain Reviewed previous op note and CT GB behind the colon at this point and previous attempt unsuccessful MRCP shows GB probably fused to CBD  Extremely risky procedure especially since unclear if she has cholecystitis or not  Check HIDA to see about patency of cystic duct   Do not feel that cholecystectomy safe and she is now decompressed  Most likely ascending cholangitis and this seem better    Would base surgery on pts overall condition and symptoms    At best, she would end up with another drainage tube    Consider DC tomorrow after HIDA if labs, pt continue to improve   High complexity   LOS: 3 days    Maisie Fus A Zaydah Nawabi 09/06/2023

## 2023-09-07 ENCOUNTER — Encounter (HOSPITAL_COMMUNITY): Payer: Self-pay | Admitting: Gastroenterology

## 2023-09-07 ENCOUNTER — Inpatient Hospital Stay (HOSPITAL_COMMUNITY): Payer: 59

## 2023-09-07 ENCOUNTER — Other Ambulatory Visit (HOSPITAL_COMMUNITY): Payer: Self-pay

## 2023-09-07 DIAGNOSIS — R17 Unspecified jaundice: Secondary | ICD-10-CM | POA: Diagnosis not present

## 2023-09-07 LAB — MAGNESIUM: Magnesium: 1.6 mg/dL — ABNORMAL LOW (ref 1.7–2.4)

## 2023-09-07 LAB — CBC WITH DIFFERENTIAL/PLATELET
Abs Immature Granulocytes: 0.03 10*3/uL (ref 0.00–0.07)
Basophils Absolute: 0 10*3/uL (ref 0.0–0.1)
Basophils Relative: 1 %
Eosinophils Absolute: 0.2 10*3/uL (ref 0.0–0.5)
Eosinophils Relative: 4 %
HCT: 28.5 % — ABNORMAL LOW (ref 36.0–46.0)
Hemoglobin: 9.3 g/dL — ABNORMAL LOW (ref 12.0–15.0)
Immature Granulocytes: 1 %
Lymphocytes Relative: 35 %
Lymphs Abs: 2.1 10*3/uL (ref 0.7–4.0)
MCH: 28.9 pg (ref 26.0–34.0)
MCHC: 32.6 g/dL (ref 30.0–36.0)
MCV: 88.5 fL (ref 80.0–100.0)
Monocytes Absolute: 0.6 10*3/uL (ref 0.1–1.0)
Monocytes Relative: 10 %
Neutro Abs: 3 10*3/uL (ref 1.7–7.7)
Neutrophils Relative %: 49 %
Platelets: 195 10*3/uL (ref 150–400)
RBC: 3.22 MIL/uL — ABNORMAL LOW (ref 3.87–5.11)
RDW: 18 % — ABNORMAL HIGH (ref 11.5–15.5)
WBC: 5.9 10*3/uL (ref 4.0–10.5)
nRBC: 0 % (ref 0.0–0.2)

## 2023-09-07 LAB — COMPREHENSIVE METABOLIC PANEL
ALT: 41 U/L (ref 0–44)
AST: 40 U/L (ref 15–41)
Albumin: 2.2 g/dL — ABNORMAL LOW (ref 3.5–5.0)
Alkaline Phosphatase: 557 U/L — ABNORMAL HIGH (ref 38–126)
Anion gap: 6 (ref 5–15)
BUN: 9 mg/dL (ref 8–23)
CO2: 29 mmol/L (ref 22–32)
Calcium: 8.6 mg/dL — ABNORMAL LOW (ref 8.9–10.3)
Chloride: 101 mmol/L (ref 98–111)
Creatinine, Ser: 0.95 mg/dL (ref 0.44–1.00)
GFR, Estimated: >60 mL/min (ref >60)
Glucose, Bld: 81 mg/dL (ref 70–99)
Potassium: 4 mmol/L (ref 3.5–5.1)
Sodium: 136 mmol/L (ref 135–145)
Total Bilirubin: 2.4 mg/dL — ABNORMAL HIGH (ref 0.3–1.2)
Total Protein: 5.9 g/dL — ABNORMAL LOW (ref 6.5–8.1)

## 2023-09-07 LAB — CULTURE, BLOOD (ROUTINE X 2)
Special Requests: ADEQUATE
Special Requests: ADEQUATE

## 2023-09-07 LAB — C-REACTIVE PROTEIN: CRP: <0.5 mg/dL (ref <1.0)

## 2023-09-07 LAB — BRAIN NATRIURETIC PEPTIDE: B Natriuretic Peptide: 166.3 pg/mL — ABNORMAL HIGH (ref 0.0–100.0)

## 2023-09-07 LAB — PROCALCITONIN: Procalcitonin: 0.54 ng/mL

## 2023-09-07 MED ORDER — AMOXICILLIN-POT CLAVULANATE 875-125 MG PO TABS: 1.0000 | ORAL_TABLET | Freq: Two times a day (BID) | ORAL | Status: DC

## 2023-09-07 MED ORDER — OXYCODONE-ACETAMINOPHEN 10-325 MG PO TABS
1.0000 | ORAL_TABLET | Freq: Two times a day (BID) | ORAL | 0 refills | Status: DC | PRN
  Filled 2023-09-07: qty 10, 5d supply, fill #0

## 2023-09-07 MED ORDER — AMOXICILLIN-POT CLAVULANATE 875-125 MG PO TABS
1.0000 | ORAL_TABLET | Freq: Three times a day (TID) | ORAL | 0 refills | Status: DC
  Filled 2023-09-07: qty 15, 5d supply, fill #0

## 2023-09-07 MED ORDER — TECHNETIUM TC 99M MEBROFENIN IV KIT
7.4000 | PACK | Freq: Once | INTRAVENOUS | Status: AC | PRN
  Administered 2023-09-07: 7.4 via INTRAVENOUS

## 2023-09-07 MED ORDER — AMLODIPINE BESYLATE 10 MG PO TABS
10.0000 mg | ORAL_TABLET | Freq: Every day | ORAL | 0 refills | Status: DC
  Filled 2023-09-07: qty 30, 30d supply, fill #0

## 2023-09-07 MED ORDER — MAGNESIUM SULFATE 4 GM/100ML IV SOLN
4.0000 g | Freq: Once | INTRAVENOUS | Status: AC
  Administered 2023-09-07: 4 g via INTRAVENOUS
  Filled 2023-09-07: qty 100

## 2023-09-07 MED ORDER — AMOXICILLIN-POT CLAVULANATE 875-125 MG PO TABS
1.0000 | ORAL_TABLET | Freq: Two times a day (BID) | ORAL | 0 refills | Status: DC
  Filled 2023-09-07: qty 10, 5d supply, fill #0

## 2023-09-07 NOTE — Discharge Summary (Signed)
[place] Suzanne Harrison:[id number] DOB: 09/01/1951 DOA: 09/03/2023  PCP: Irven Coe, MD  Admit date: 09/03/2023  Discharge date: 09/07/2023  Admitted From: Home   Disposition:  Home   Recommendations for Outpatient Follow-up:   Follow up with PCP in 1-2 weeks  PCP Please obtain BMP/CBC, 2 view CXR in 1week,  (see Discharge instructions)   PCP Please follow up on the following pending results:    Home Health: PT   Equipment/Devices: None  Consultations: CCS, GI Discharge Condition: Stable    CODE STATUS: Full    Diet Recommendation: Heart Healthy   Diet Order             DIET SOFT Room service appropriate? Yes; Fluid consistency: Thin  Diet effective now           Diet - low sodium heart healthy                    Chief Complaint  Patient presents with   Hypotension     Brief history of present illness from the day of admission and additional interim summary     72 y.o. female with medical history significant of CKD, gallstones, HTN, dyslipidemia, ischemic colitis, cholecystitis s/p perc cholecystostomy tube (10/04/2019) and other medical issues who presented with abdominal pain.  Further workup suggested that she had a CBD stone with gallbladder distention.                                                                  Hospital Course   Right upper quadrant abdominal Pain  Jaundice  Elevated Liver Enzymes, History Cholecystitis s/p Perc Chole Tube (10/2019) - now has CBD stone with gallbladder distention.  Possible mild ascending cholangitis, sepsis with gram-negative bacteremia. She did with antibiotics IV fluids underwent ERCP with sphincterectomy, CBD stones removed, post ERCP clinically better LFTs improving, Ecoli bacteremia responded well to Rocephin, based on her allergies now on  Augmentin high dose x 5 more days, sepsis has resolved, post ERCP stable HIDA, DC home per CCS, will follo with PCP-GI and CCS post DC, now symptom free.   Acute Kidney Injury Follow with IVF, resolved   Hypokalemia and hypomagnesemia.  Replaced again on 09/05/2023.     Prolonged Qtc Caution with QT prolonging meds, replace electrolytes.   Hypertension Home dose ACE and diuretic, underlying Sinus Bradycardia hence B Blocker >> Norvasc.   Sinus Bradycardia hence B Blocker >> Norvasc, stable TSH, PCP to monitor.  Depression  Wellbutrin, celexa   Hypothyroidism  Synthroid  GERD PPI   Chronic Pain Lyrica  Prn oxycodone   Overactive Bladder Detrol LA  Dyslipidemia Statin on hold with abnormal LFT's   Obesity Body mass index is 33.1 kg/m.  Follow-up with PCP for weight loss.    Discharge  diagnosis     Principal Problem:   Elevated bilirubin Active Problems:   Jaundice    Discharge instructions    Discharge Instructions     Diet - low sodium heart healthy   Complete by: As directed    Discharge instructions   Complete by: As directed    Follow with Primary MD Irven Coe, MD in 7 days   Get CBC, CMP, Magnesium, 2 view Chest X ray -  checked next visit with your primary MD    Activity: As tolerated with Full fall precautions use walker/cane & assistance as needed  Disposition Home   Diet: Heart Healthy    Special Instructions: If you have smoked or chewed Tobacco  in the last 2 yrs please stop smoking, stop any regular Alcohol  and or any Recreational drug use.  On your next visit with your primary care physician please Get Medicines reviewed and adjusted.  Please request your Prim.MD to go over all Hospital Tests and Procedure/Radiological results at the follow up, please get all Hospital records sent to your Prim MD by signing hospital release before you go home.  If you experience worsening of your admission symptoms, develop shortness of breath,  life threatening emergency, suicidal or homicidal thoughts you must seek medical attention immediately by calling 911 or calling your MD immediately  if symptoms less severe.  You Must read complete instructions/literature along with all the possible adverse reactions/side effects for all the Medicines you take and that have been prescribed to you. Take any new Medicines after you have completely understood and accpet all the possible adverse reactions/side effects.   Do not drive when taking Pain medications.  Do not take more than prescribed Pain, Sleep and Anxiety Medications   Increase activity slowly   Complete by: As directed        Discharge Medications   Allergies as of 09/07/2023       Reactions   Chlorthalidone Other (See Comments)   hyponatremia   Sulfamethazine Other (See Comments)   Sulfur Other (See Comments)   Client doesn't remember type of reaction   Gatifloxacin Other (See Comments), Rash   Other reaction(s): Myalgias (Muscle Pain) Rash in mouth   Sulfa Antibiotics Other (See Comments)   GI Intolerance        Medication List     STOP taking these medications    atenolol 50 MG tablet Commonly known as: TENORMIN       TAKE these medications    amLODipine 10 MG tablet Commonly known as: NORVASC Take 1 tablet (10 mg total) by mouth daily.   amoxicillin-clavulanate 875-125 MG tablet Commonly known as: AUGMENTIN Take 1 tablet by mouth every 8 (eight) hours. Start taking on: September 08, 2023   aspirin EC 81 MG tablet Take 81 mg by mouth at bedtime.   Biotin 10 MG Tabs Take 10 mg by mouth daily.   buPROPion 150 MG 24 hr tablet Commonly known as: WELLBUTRIN XL Take 150 mg by mouth daily.   CALCIUM-D PO Take 1 tablet by mouth 2 (two) times daily.   cetirizine 10 MG tablet Commonly known as: ZYRTEC Take 10 mg by mouth daily.   citalopram 40 MG tablet Commonly known as: CELEXA Take 40 mg by mouth daily.   furosemide 20 MG tablet Commonly  known as: LASIX 20 mg daily as needed for fluid.   levothyroxine 50 MCG tablet Commonly known as: SYNTHROID Take 50 mcg by mouth at bedtime.   lisinopril 5  MG tablet Commonly known as: ZESTRIL Take 5 mg by mouth daily.   Lyrica 150 MG capsule Generic drug: pregabalin Take 150 mg by mouth 2 (two) times daily.   multivitamin with minerals Tabs tablet Take 1 tablet by mouth daily.   oxyCODONE-acetaminophen 10-325 MG tablet Commonly known as: PERCOCET Take 1 tablet by mouth every 12 (twelve) hours as needed for pain. What changed: when to take this   pantoprazole 40 MG tablet Commonly known as: PROTONIX Take 40 mg by mouth 2 (two) times daily.   rosuvastatin 10 MG tablet Commonly known as: CRESTOR Take 10 mg by mouth at bedtime.   tolterodine 2 MG 24 hr capsule Commonly known as: DETROL LA Take 2 mg by mouth daily.         Follow-up Information     Irven Coe, MD. Schedule an appointment as soon as possible for a visit in 1 week(s).   Specialty: Family Medicine Contact information: [address] [place] [address] [phone]         [facility]. Schedule an appointment as soon as possible for a visit in 2 week(s).   Contact information: [address] [place] [address] [phone]        Vida Rigger, MD. Schedule an appointment as soon as possible for a visit in 1 week(s).   Specialty: Gastroenterology Contact information: [address] [place] [address] [phone]                 Major procedures and Radiology Reports - PLEASE review detailed and final reports thoroughly  -      NM Hepato W/EF  Result Date: 09/07/2023 CLINICAL DATA:  Distended gallbladder. Evaluation of cystic duct patency status post ERCP. EXAM: NUCLEAR MEDICINE HEPATOBILIARY IMAGING WITH GALLBLADDER EF TECHNIQUE: Sequential images of the abdomen were obtained out to 60 minutes  following intravenous administration of radiopharmaceutical. Imaging over an additional 30 minutes were obtained to ensure gallbladder filling. After oral ingestion of Ensure, gallbladder ejection fraction was determined. At 60 min, normal ejection fraction is greater than 33%. RADIOPHARMACEUTICALS:  7.4 mCi Tc-55m  Choletec IV COMPARISON:  MR abdomen dated 09/03/2023 FINDINGS: Prompt uptake and biliary excretion of activity by the liver is seen. Gallbladder activity is visualized, consistent with patency of cystic duct. Biliary activity passes into small bowel, consistent with patent common bile duct. There is near-complete emptying of the gallbladder. Calculated gallbladder ejection fraction is 100%, noting calculation of gallbladder ejection fraction is technically challenging due to overlapping bowel activity. (Normal gallbladder ejection fraction with Ensure is greater than 33% and less than 80%.) IMPRESSION: Patent cystic and common bile ducts. Electronically Signed   By: Agustin Cree M.D.   On: 09/07/2023 16:24   DG ERCP  Result Date: 09/05/2023 CLINICAL DATA:  ERCP.  Choledocholithiasis. EXAM: ERCP TECHNIQUE: Multiple spot images obtained with the fluoroscopic device and submitted for interpretation post-procedure. COMPARISON:  MRCP-09/03/2023 FINDINGS: Five spot intraoperative fluoroscopic images of the right upper abdominal quadrant during ERCP are provided for review. Initial image demonstrates an ERCP probe overlying the right upper abdominal quadrant. There is a nonocclusive filling defect within the CBD suggestive of choledocholithiasis. Post long segment thoracolumbar paraspinal fusion, incompletely evaluated though both right-sided paraspinal rods appear fractured. Subsequent images demonstrate insufflation of a balloon within distal aspect of the CBD with subsequent biliary sweeping and presumed stone extraction and sphincterotomy. There is minimal opacification of the intrahepatic biliary tree  which appears nondilated. There is no definitive opacification of the cystic or pancreatic ducts. IMPRESSION: ERCP with findings of choledocholithiasis with subsequent presumed biliary sweeping and sphincterotomy. These images were submitted for radiologic interpretation only. Please see the procedural report for the amount of contrast and the fluoroscopy time utilized. Electronically Signed   By: Simonne Come M.D.   On: 09/05/2023 18:09   MR ABDOMEN MRCP W WO CONTAST  Result Date: 09/03/2023 CLINICAL DATA:  Jaundice EXAM: MRI ABDOMEN WITHOUT AND WITH CONTRAST (INCLUDING MRCP) TECHNIQUE: Multiplanar multisequence MR imaging of the abdomen was performed both before and after the administration of intravenous contrast. Heavily T2-weighted images of the biliary and pancreatic ducts were obtained, and three-dimensional MRCP images were rendered by post processing. CONTRAST:  10mL GADAVIST GADOBUTROL 1 MMOL/ML IV SOLN COMPARISON:  Right upper quadrant ultrasound, 09/03/2023 FINDINGS: Lower chest: No acute abnormality.  Cardiomegaly. Hepatobiliary: No solid liver abnormality is seen. Choledocholithiasis, with large gallstone near the ampulla measuring 1.0 cm. Intra and extrahepatic biliary ductal dilatation, the central common bile duct measuring up to 1.2 cm. Mildly distended gallbladder with additional tiny gallstones. Pancreas: Unremarkable. No pancreatic ductal dilatation or surrounding inflammatory changes. Spleen: Mild splenomegaly, maximum span 13.5 cm. Adrenals/Urinary Tract: Adrenal glands are unremarkable. Numerous bilateral simple and thinly septated bilateral renal cortical cysts, benign, for which no further follow-up or characterization is required. Mild left hydronephrosis with abrupt caliber change at the ureteropelvic junction, suggesting chronic ureteropelvic junction stricture, appearance generally unchanged compared to prior examination. Stomach/Bowel: Stomach is within normal limits. No evidence of  bowel wall thickening, distention, or inflammatory changes. Vascular/Lymphatic: No significant vascular findings are present. No enlarged abdominal lymph nodes. Other: No abdominal wall hernia or abnormality. No ascites. Musculoskeletal: No acute or significant osseous findings. IMPRESSION: 1. Choledocholithiasis, with large gallstone near the ampulla measuring 1.0 cm. Intra and extrahepatic biliary ductal dilatation, the central common bile duct measuring up to 1.2 cm. Notably, there was a calculus in the central common bile duct on examination dated 10/06/2022, calculus on today's examination appears to be substantially larger. 2. Mildly distended gallbladder with additional tiny gallstones. 3. Mild left hydronephrosis with abrupt caliber change at the ureteropelvic junction, suggesting chronic ureteropelvic junction stricture, appearance generally unchanged compared to prior examination. Nuclear scintigraphic Lasix renogram can be considered on a nonemergent, outpatient basis to assess for functional significance if and when clinically appropriate. Electronically Signed   By: Jearld Lesch M.D.   On: 09/03/2023 21:28   US Abdomen Limited RUQ (LIVER/GB)  Result Date: 09/03/2023 CLINICAL DATA:  Right upper quadrant pain.  Mass lesion. EXAM: ULTRASOUND ABDOMEN LIMITED RIGHT UPPER QUADRANT COMPARISON:  Ultrasound 07/24/2023.  CT scan 10/06/2022 FINDINGS: Gallbladder: Distended gallbladder. Slight wall thickening measured up to 6 mm. No adjacent fluid. No shadowing stones. Common bile duct: Diameter: 10 mm, upper limits of normal for patient's age. Liver: No focal lesion identified. Within normal limits in parenchymal echogenicity. Portal vein is patent on color Doppler imaging with normal direction of blood flow towards the liver. Other: Upper pole right-sided simple appearing renal cyst measuring 5.4 cm. IMPRESSION: Distended gallbladder with slight wall thickening but no stones or stranding. Please correlate  with symptoms. Further workup as clinically appropriate. No biliary ductal dilatation. Common duct at the upper limits of normal for patient's age. Electronically Signed   By: Karen Kays M.D.   On: 09/03/2023 16:03    Micro Results    Recent Results (from the past 240 hour(s))  Blood culture (routine x  2)     Status: Abnormal   Collection Time: 09/03/23  3:54 PM   Specimen: BLOOD  Result Value Ref Range Status   Specimen Description BLOOD BLOOD RIGHT ARM  Final   Special Requests   Final    BOTTLES DRAWN AEROBIC AND ANAEROBIC Blood Culture adequate volume   Culture  Setup Time   Final    GRAM NEGATIVE RODS AEROBIC BOTTLE ONLY CRITICAL RESULT CALLED TO, READ BACK BY AND VERIFIED WITH: PHARMD Cedric Fishman [id number] AT 1018 BY EC Performed at [facility], [address], [place], [place] [address]    Culture ESCHERICHIA COLI (A)  Final   Report Status 09/07/2023 FINAL  Final   Organism ID, Bacteria ESCHERICHIA COLI  Final      Susceptibility   Escherichia coli - MIC*    AMPICILLIN >=32 RESISTANT Resistant     CEFEPIME <=0.12 SENSITIVE Sensitive     CEFTAZIDIME <=1 SENSITIVE Sensitive     CEFTRIAXONE 0.5 SENSITIVE Sensitive     CIPROFLOXACIN <=0.25 SENSITIVE Sensitive     GENTAMICIN <=1 SENSITIVE Sensitive     IMIPENEM <=0.25 SENSITIVE Sensitive     TRIMETH/SULFA <=20 SENSITIVE Sensitive     AMPICILLIN/SULBACTAM 4 SENSITIVE Sensitive     PIP/TAZO <=4 SENSITIVE Sensitive ug/mL    * ESCHERICHIA COLI  Blood Culture ID Panel (Reflexed)     Status: Abnormal   Collection Time: 09/03/23  3:54 PM  Result Value Ref Range Status   Enterococcus faecalis NOT DETECTED NOT DETECTED Final   Enterococcus Faecium NOT DETECTED NOT DETECTED Final   Listeria monocytogenes NOT DETECTED NOT DETECTED Final   Staphylococcus species NOT DETECTED NOT DETECTED Final   Staphylococcus aureus (BCID) NOT DETECTED NOT DETECTED Final   Staphylococcus epidermidis NOT DETECTED NOT DETECTED Final    Staphylococcus lugdunensis NOT DETECTED NOT DETECTED Final   Streptococcus species NOT DETECTED NOT DETECTED Final   Streptococcus agalactiae NOT DETECTED NOT DETECTED Final   Streptococcus pneumoniae NOT DETECTED NOT DETECTED Final   Streptococcus pyogenes NOT DETECTED NOT DETECTED Final   A.calcoaceticus-baumannii NOT DETECTED NOT DETECTED Final   Bacteroides fragilis NOT DETECTED NOT DETECTED Final   Enterobacterales DETECTED (A) NOT DETECTED Final    Comment: Enterobacterales represent a large order of gram negative bacteria, not a single organism. CRITICAL RESULT CALLED TO, READ BACK BY AND VERIFIED WITH: PHARMD NATHAN GOAD [id number] AT 1018 BY EC    Enterobacter cloacae complex NOT DETECTED NOT DETECTED Final   Escherichia coli DETECTED (A) NOT DETECTED Final    Comment: CRITICAL RESULT CALLED TO, READ BACK BY AND VERIFIED WITH: PHARMD NATHAN GOAD [id number] AT 1018 BY EC    Klebsiella aerogenes NOT DETECTED NOT DETECTED Final   Klebsiella oxytoca NOT DETECTED NOT DETECTED Final   Klebsiella pneumoniae NOT DETECTED NOT DETECTED Final   Proteus species NOT DETECTED NOT DETECTED Final   Salmonella species NOT DETECTED NOT DETECTED Final   Serratia marcescens NOT DETECTED NOT DETECTED Final   Haemophilus influenzae NOT DETECTED NOT DETECTED Final   Neisseria meningitidis NOT DETECTED NOT DETECTED Final   Pseudomonas aeruginosa NOT DETECTED NOT DETECTED Final   Stenotrophomonas maltophilia NOT DETECTED NOT DETECTED Final   Candida albicans NOT DETECTED NOT DETECTED Final   Candida auris NOT DETECTED NOT DETECTED Final   Candida glabrata NOT DETECTED NOT DETECTED Final   Candida krusei NOT DETECTED NOT DETECTED Final   Candida parapsilosis NOT DETECTED NOT DETECTED Final   Candida tropicalis NOT DETECTED NOT DETECTED  Final   Cryptococcus neoformans/gattii NOT DETECTED NOT DETECTED Final   CTX-M ESBL NOT DETECTED NOT DETECTED Final   Carbapenem resistance IMP NOT DETECTED NOT DETECTED  Final   Carbapenem resistance KPC NOT DETECTED NOT DETECTED Final   Carbapenem resistance NDM NOT DETECTED NOT DETECTED Final   Carbapenem resist OXA 48 LIKE NOT DETECTED NOT DETECTED Final   Carbapenem resistance VIM NOT DETECTED NOT DETECTED Final    Comment: Performed at [facility], [address] [place], [place] [address]  Blood culture (routine x 2)     Status: Abnormal   Collection Time: 09/03/23  4:00 PM   Specimen: BLOOD  Result Value Ref Range Status   Specimen Description BLOOD BLOOD LEFT ARM  Final   Special Requests   Final    BOTTLES DRAWN AEROBIC AND ANAEROBIC Blood Culture adequate volume   Culture  Setup Time   Final    GRAM NEGATIVE RODS AEROBIC BOTTLE ONLY CRITICAL VALUE NOTED.  VALUE IS CONSISTENT WITH PREVIOUSLY REPORTED AND CALLED VALUE.    Culture (A)  Final    ESCHERICHIA COLI SUSCEPTIBILITIES PERFORMED ON PREVIOUS CULTURE WITHIN THE LAST 5 DAYS. Performed at [facility], [address], [place], [place] [address]    Report Status 09/07/2023 FINAL  Final    Today   Subjective    [place] Suzanne Harrison today has no headache,no chest abdominal pain,no new weakness tingling or numbness, feels much better wants to go home today.    Objective   Blood pressure 160-85, pulse (!) 50, temperature 98 F (36.7 C), temperature source Oral, resp. rate 10, height 5\' 2"  (1.575 m), weight 82.1 kg, SpO2 97%.   Intake/Output Summary (Last 24 hours) at 09/07/2023 1639 Last data filed at 09/07/2023 1500 Gross per 24 hour  Intake 918 ml  Output --  Net 918 ml    Exam  Awake Alert, No new F.N deficits,    [place].AT,PERRAL Supple Neck,   Symmetrical Chest wall movement, Good air movement bilaterally, CTAB RRR,No Gallops,   +ve B.Sounds, Abd Soft, Non tender,  No Cyanosis, Clubbing or edema    Data Review   Recent Labs  Lab 09/03/23 1320 09/04/23 0621 09/05/23 0620 09/06/23 0349 09/07/23 0339  WBC 23.8* 12.0* 6.8 6.5 5.9  HGB 10.6* 8.8* 9.5*  9.5* 9.3*  HCT 32.9* 27.2* 29.3* 29.4* 28.5*  PLT 211 146* 158 172 195  MCV 88.0 85.5 85.2 86.2 88.5  MCH 28.3 27.7 27.6 27.9 28.9  MCHC 32.2 32.4 32.4 32.3 32.6  RDW 17.3* 17.2* 17.3* 17.7* 18.0*  LYMPHSABS 0.8 1.2 1.4 1.2 2.1  MONOABS 1.1* 0.5 0.4 0.5 0.6  EOSABS 0.0 0.1 0.2 0.0 0.2  BASOSABS 0.0 0.0 0.0 0.0 0.0    Recent Labs  Lab 09/03/23 1320 09/03/23 1555 09/04/23 0621 09/05/23 0620 09/06/23 0349 09/06/23 0728 09/07/23 0339  NA 132*  --  135 135 136  --  136  K 3.8  --  3.1* 3.2* 4.3  --  4.0  CL 98  --  102 101 100  --  101  CO2 23  --  27 25 27   --  29  ANIONGAP 11  --  6 9 9   --  6  GLUCOSE 145*  --  78 82 119*  --  81  BUN 18  --  16 8 9   --  9  CREATININE 1.54*  --  1.04* 0.91 0.94  --  0.95  AST 164*  --  110* 70* 43*  --  40  ALT 92*  --  68* 58* 46*  --  41  ALKPHOS 858*  --  639* 652* 602*  --  557*  BILITOT 9.9*  --  8.1* 5.2* 2.8*  --  2.4*  ALBUMIN 2.4*  --  1.9* 2.1* 2.0*  --  2.2*  CRP  --   --  18.7* 18.8* <0.5  --  <0.5  PROCALCITON  --   --  4.34 1.82 0.95  --  0.54  LATICACIDVEN  --  0.6  --   --   --   --   --   INR  --   --  1.2  --   --  1.1  --   TSH  --   --   --  5.681*  --  1.071  --   BNP  --   --  236.0* 606.2* 597.2*  --  166.3*  MG  --   --  1.5* 1.9 1.8  --  1.6*  CALCIUM 8.5*  --  8.4* 8.5* 8.6*  --  8.6*    Total Time in preparing paper work, data evaluation and todays exam - 35 minutes  Signature  -    Susa Raring M.D on 09/07/2023 at 4:39 PM   -  To page go to [email or web address]

## 2023-09-07 NOTE — Progress Notes (Signed)
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   Dorathy Kinsman Delpha Perko 09/07/2023, 5:49 PM*   *delayed entry

## 2023-09-07 NOTE — TOC Progression Note (Signed)
Transition of Care [facility]) - Progression Note    Patient Details  Name: SHAMEKA AGGARWAL MRN: [id number] Date of Birth: [birth year]  [facility] CM/SW Contact  Gordy Clement, RN Phone Number: 09/07/2023, 3:55 PM  Clinical Narrative:     CM received Consult to [facility] for medication assistance.  Patient is insured with [employer] . CM will follow for specific needs related to medication at DC.          Expected Discharge Plan and Services                                               Social Determinants of Health (SDOH) Interventions SDOH Screenings   Food Insecurity: No Food Insecurity (09/03/2023)  Housing: Low Risk  (09/03/2023)  Transportation Needs: No Transportation Needs (09/03/2023)  Utilities: Not At Risk (09/03/2023)  Tobacco Use: Low Risk  (09/05/2023)    Readmission Risk Interventions     No data to display

## 2023-09-07 NOTE — Discharge Instructions (Signed)
Follow with Primary MD Irven Coe, MD in 7 days   Get CBC, CMP, Magnesium, 2 view Chest X ray -  checked next visit with your primary MD    Activity: As tolerated with Full fall precautions use walker/cane & assistance as needed  Disposition Home   Diet: Heart Healthy    Special Instructions: If you have smoked or chewed Tobacco  in the last 2 yrs please stop smoking, stop any regular Alcohol  and or any Recreational drug use.  On your next visit with your primary care physician please Get Medicines reviewed and adjusted.  Please request your Prim.MD to go over all Hospital Tests and Procedure/Radiological results at the follow up, please get all Hospital records sent to your Prim MD by signing hospital release before you go home.  If you experience worsening of your admission symptoms, develop shortness of breath, life threatening emergency, suicidal or homicidal thoughts you must seek medical attention immediately by calling 911 or calling your MD immediately  if symptoms less severe.  You Must read complete instructions/literature along with all the possible adverse reactions/side effects for all the Medicines you take and that have been prescribed to you. Take any new Medicines after you have completely understood and accpet all the possible adverse reactions/side effects.   Do not drive when taking Pain medications.  Do not take more than prescribed Pain, Sleep and Anxiety Medications

## 2023-09-07 NOTE — Progress Notes (Signed)
HIDA negative, cleared for DC from general surgery standpoint. No plans for surgical intervention at this time.   Juliet Rude, [facility] 09/07/2023, 4:36 PM Please see Amion for pager number during day hours 7:00am-4:30pm

## 2024-07-10 ENCOUNTER — Other Ambulatory Visit (HOSPITAL_COMMUNITY): Payer: Self-pay

## 2024-10-11 ENCOUNTER — Other Ambulatory Visit: Payer: Self-pay | Admitting: Orthopaedic Surgery

## 2024-10-11 DIAGNOSIS — M19011 Primary osteoarthritis, right shoulder: Secondary | ICD-10-CM

## 2024-10-18 ENCOUNTER — Ambulatory Visit
Admission: RE | Admit: 2024-10-18 | Discharge: 2024-10-18 | Disposition: A | Payer: PRIVATE HEALTH INSURANCE | Source: Ambulatory Visit | Attending: Orthopaedic Surgery

## 2024-10-18 DIAGNOSIS — M19011 Primary osteoarthritis, right shoulder: Secondary | ICD-10-CM

## 2025-01-05 NOTE — Progress Notes (Signed)
 Sent message, via epic in basket, requesting orders in epic from Careers adviser.

## 2025-01-12 ENCOUNTER — Encounter (HOSPITAL_COMMUNITY): Admission: RE | Admit: 2025-01-12 | Payer: Self-pay

## 2025-01-24 ENCOUNTER — Ambulatory Visit (HOSPITAL_COMMUNITY): Admit: 2025-01-24 | Payer: Self-pay | Admitting: Orthopaedic Surgery

## 2025-01-24 SURGERY — ARTHROPLASTY, SHOULDER, TOTAL, REVERSE
Anesthesia: General | Site: Shoulder | Laterality: Right
